# Patient Record
Sex: Male | Born: 1945 | Race: White | Hispanic: No | Marital: Married | Smoking: Former smoker
Health system: Southern US, Community
[De-identification: ages and names within clinical notes are randomized; demographics above are authoritative.]

## PROBLEM LIST (undated history)

## (undated) DIAGNOSIS — G4733 Obstructive sleep apnea (adult) (pediatric): Secondary | ICD-10-CM

## (undated) DIAGNOSIS — M25552 Pain in left hip: Secondary | ICD-10-CM

## (undated) DIAGNOSIS — Z87891 Personal history of nicotine dependence: Secondary | ICD-10-CM

## (undated) DIAGNOSIS — M545 Low back pain, unspecified: Secondary | ICD-10-CM

## (undated) DIAGNOSIS — M159 Polyosteoarthritis, unspecified: Secondary | ICD-10-CM

## (undated) DIAGNOSIS — N189 Chronic kidney disease, unspecified: Secondary | ICD-10-CM

## (undated) DIAGNOSIS — E7800 Pure hypercholesterolemia, unspecified: Secondary | ICD-10-CM

## (undated) DIAGNOSIS — Z96652 Presence of left artificial knee joint: Secondary | ICD-10-CM

## (undated) DIAGNOSIS — J189 Pneumonia, unspecified organism: Secondary | ICD-10-CM

## (undated) DIAGNOSIS — F329 Major depressive disorder, single episode, unspecified: Secondary | ICD-10-CM

## (undated) DIAGNOSIS — E1165 Type 2 diabetes mellitus with hyperglycemia: Secondary | ICD-10-CM

## (undated) DIAGNOSIS — K219 Gastro-esophageal reflux disease without esophagitis: Secondary | ICD-10-CM

## (undated) DIAGNOSIS — Z8673 Personal history of transient ischemic attack (TIA), and cerebral infarction without residual deficits: Secondary | ICD-10-CM

## (undated) DIAGNOSIS — T8454XA Infection and inflammatory reaction due to internal left knee prosthesis, initial encounter: Secondary | ICD-10-CM

## (undated) DIAGNOSIS — F32A Depression, unspecified: Secondary | ICD-10-CM

## (undated) DIAGNOSIS — E0840 Diabetes mellitus due to underlying condition with diabetic neuropathy, unspecified: Secondary | ICD-10-CM

## (undated) DIAGNOSIS — A389 Scarlet fever, uncomplicated: Secondary | ICD-10-CM

## (undated) HISTORY — DX: Gastro-esophageal reflux disease without esophagitis: K21.9

## (undated) HISTORY — DX: Polyosteoarthritis, unspecified: M15.9

## (undated) HISTORY — DX: Infection and inflammatory reaction due to internal left knee prosthesis, initial encounter: T84.54XA

## (undated) HISTORY — DX: Pure hypercholesterolemia, unspecified: E78.00

## (undated) HISTORY — DX: Depression, unspecified: F32.A

## (undated) HISTORY — DX: Personal history of nicotine dependence: Z87.891

## (undated) HISTORY — DX: Major depressive disorder, single episode, unspecified: F32.9

## (undated) HISTORY — DX: Personal history of transient ischemic attack (TIA), and cerebral infarction without residual deficits: Z86.73

## (undated) HISTORY — DX: Pain in left hip: M25.552

## (undated) HISTORY — DX: Presence of left artificial knee joint: Z96.652

## (undated) HISTORY — DX: Low back pain: M54.5

## (undated) HISTORY — PX: TOE AMPUTATION: SHX809

## (undated) HISTORY — DX: Obstructive sleep apnea (adult) (pediatric): G47.33

## (undated) HISTORY — DX: Low back pain, unspecified: M54.50

## (undated) HISTORY — DX: Diabetes mellitus due to underlying condition with diabetic neuropathy, unspecified: E08.40

## (undated) HISTORY — PX: JOINT REPLACEMENT: SHX530

## (undated) HISTORY — DX: Type 2 diabetes mellitus with hyperglycemia: E11.65

## (undated) HISTORY — PX: OTHER SURGICAL HISTORY: SHX169

## (undated) HISTORY — PX: EYE SURGERY: SHX253

---

## 2003-01-12 ENCOUNTER — Ambulatory Visit (HOSPITAL_COMMUNITY): Admission: RE | Admit: 2003-01-12 | Discharge: 2003-01-12 | Payer: Self-pay | Admitting: Unknown Physician Specialty

## 2011-01-19 ENCOUNTER — Other Ambulatory Visit: Payer: Self-pay | Admitting: Orthopaedic Surgery

## 2011-01-19 DIAGNOSIS — M5136 Other intervertebral disc degeneration, lumbar region: Secondary | ICD-10-CM

## 2011-01-28 ENCOUNTER — Other Ambulatory Visit: Payer: Self-pay | Admitting: Orthopaedic Surgery

## 2011-01-28 DIAGNOSIS — M545 Low back pain: Secondary | ICD-10-CM

## 2011-01-29 ENCOUNTER — Ambulatory Visit
Admission: RE | Admit: 2011-01-29 | Discharge: 2011-01-29 | Disposition: A | Discharge status: Home / Self Care | Payer: Medicare Other | Source: Ambulatory Visit | Attending: Orthopaedic Surgery | Admitting: Orthopaedic Surgery

## 2011-01-29 DIAGNOSIS — M545 Low back pain, unspecified: Secondary | ICD-10-CM

## 2016-01-07 ENCOUNTER — Ambulatory Visit (INDEPENDENT_AMBULATORY_CARE_PROVIDER_SITE_OTHER): Payer: Medicare Other | Admitting: Internal Medicine

## 2016-01-07 ENCOUNTER — Encounter: Payer: Self-pay | Admitting: Internal Medicine

## 2016-01-07 VITALS — BP 149/78 | HR 83 | Temp 97.6°F

## 2016-01-07 DIAGNOSIS — T8454XA Infection and inflammatory reaction due to internal left knee prosthesis, initial encounter: Secondary | ICD-10-CM | POA: Diagnosis not present

## 2016-01-07 DIAGNOSIS — I10 Essential (primary) hypertension: Secondary | ICD-10-CM | POA: Insufficient documentation

## 2016-01-07 DIAGNOSIS — T8450XA Infection and inflammatory reaction due to unspecified internal joint prosthesis, initial encounter: Secondary | ICD-10-CM

## 2016-01-07 DIAGNOSIS — Z96652 Presence of left artificial knee joint: Secondary | ICD-10-CM | POA: Diagnosis not present

## 2016-01-07 DIAGNOSIS — E1140 Type 2 diabetes mellitus with diabetic neuropathy, unspecified: Secondary | ICD-10-CM | POA: Insufficient documentation

## 2016-01-07 DIAGNOSIS — Z8673 Personal history of transient ischemic attack (TIA), and cerebral infarction without residual deficits: Secondary | ICD-10-CM | POA: Insufficient documentation

## 2016-01-07 DIAGNOSIS — M2550 Pain in unspecified joint: Secondary | ICD-10-CM | POA: Diagnosis not present

## 2016-01-07 DIAGNOSIS — E119 Type 2 diabetes mellitus without complications: Secondary | ICD-10-CM | POA: Insufficient documentation

## 2016-01-07 DIAGNOSIS — A4901 Methicillin susceptible Staphylococcus aureus infection, unspecified site: Secondary | ICD-10-CM | POA: Diagnosis not present

## 2016-01-07 DIAGNOSIS — E1165 Type 2 diabetes mellitus with hyperglycemia: Secondary | ICD-10-CM

## 2016-01-07 DIAGNOSIS — E0841 Diabetes mellitus due to underlying condition with diabetic mononeuropathy: Secondary | ICD-10-CM

## 2016-01-07 DIAGNOSIS — Z87891 Personal history of nicotine dependence: Secondary | ICD-10-CM | POA: Diagnosis not present

## 2016-01-07 LAB — CBC WITH DIFFERENTIAL/PLATELET
BASOS ABS: 70 {cells}/uL (ref 0–200)
Basophils Relative: 1 %
Eosinophils Absolute: 420 cells/uL (ref 15–500)
Eosinophils Relative: 6 %
HEMATOCRIT: 36.4 % — AB (ref 38.5–50.0)
HEMOGLOBIN: 11.8 g/dL — AB (ref 13.2–17.1)
LYMPHS ABS: 2240 {cells}/uL (ref 850–3900)
LYMPHS PCT: 32 %
MCH: 29.7 pg (ref 27.0–33.0)
MCHC: 32.4 g/dL (ref 32.0–36.0)
MCV: 91.7 fL (ref 80.0–100.0)
MONO ABS: 560 {cells}/uL (ref 200–950)
MPV: 9.1 fL (ref 7.5–12.5)
Monocytes Relative: 8 %
NEUTROS PCT: 53 %
Neutro Abs: 3710 cells/uL (ref 1500–7800)
Platelets: 302 10*3/uL (ref 140–400)
RBC: 3.97 MIL/uL — AB (ref 4.20–5.80)
RDW: 13.7 % (ref 11.0–15.0)
WBC: 7 10*3/uL (ref 3.8–10.8)

## 2016-01-07 LAB — COMPLETE METABOLIC PANEL WITH GFR
ALBUMIN: 3.2 g/dL — AB (ref 3.6–5.1)
ALK PHOS: 162 U/L — AB (ref 40–115)
ALT: 9 U/L (ref 9–46)
AST: 64 U/L — ABNORMAL HIGH (ref 10–35)
BUN: 15 mg/dL (ref 7–25)
CALCIUM: 9.1 mg/dL (ref 8.6–10.3)
CHLORIDE: 99 mmol/L (ref 98–110)
CO2: 33 mmol/L — ABNORMAL HIGH (ref 20–31)
Creat: 1.27 mg/dL — ABNORMAL HIGH (ref 0.70–1.18)
GFR, EST AFRICAN AMERICAN: 66 mL/min (ref >=60)
GFR, EST NON AFRICAN AMERICAN: 57 mL/min — AB (ref >=60)
Glucose, Bld: 91 mg/dL (ref 65–99)
POTASSIUM: 4.1 mmol/L (ref 3.5–5.3)
Sodium: 142 mmol/L (ref 135–146)
Total Bilirubin: 0.4 mg/dL (ref 0.2–1.2)
Total Protein: 6.9 g/dL (ref 6.1–8.1)

## 2016-01-07 LAB — C-REACTIVE PROTEIN: CRP: 3.9 mg/dL — AB (ref <0.60)

## 2016-01-07 MED ORDER — KETOROLAC TROMETHAMINE 30 MG/ML IJ SOLN
30.0000 mg | Freq: Once | INTRAMUSCULAR | Status: AC
  Administered 2016-01-07: 30 mg via INTRAMUSCULAR

## 2016-01-07 NOTE — Progress Notes (Signed)
RFV: new consultation for management of MSSA PJI Subjective:    Patient ID: Marisa SprinklesMichael R Diehl, male    DOB: 08/24/1946, 70 y.o.   MRN: [id number]  HPI  70 yo M with difficult to control diabetes mellitus, long-standing smoker, osteoarthritis of left knee, underwent TKA in 2014 by Dr. Christell ConstantMoore in [place], [place]. His original surgery went well per patient report. In early-mid March, the patient had new onset left knee pain, myalgias, low grade fever < 7 days prior to evaluation on 3/11 where he presented to [facility] for evaluation, he was seen by orthopedist on-call, Dr Nicki GuadalajaraJeff Yaste who did arthrocentesis which the family reported as thick milk-chocolate, foul smelling appearance. Synovial fluid analysis showed 278K with 85%N  with gram stain showing GPCC concerning for prosthetic joint infection. He had underwent I x D, with size 14 zimmer gext gen 5,6 polyethylene exchange on 3/13. OR report commented that tibial base plate and femoral components were well fixed. OR cultures grew MSSA (s oxa, S tetra, S sulfa, S clinda) There is mention of positive blood cx for MSSA however i have not been able to locate culture results if that is true. He was discharged on 6 wk of cefazolin 2gm IV Q 8hr then followed up with Dr. Redmond PullingYatse on 3/22 where he noticed that he had not yet started on rifampin. Patient then started on rifampin 450mg  BID. The patient is tolerating antibiotics but he has noticed having increasing pain to his left knee, feeling especially tight. He also noticed inferior aspect of knee becoming increasingly red. He is participating in PT. Still needs assistance to get to bathroom  No fever, chills, nightsweats. He has not seen his original surgeon, Dr Christell ConstantMoore in [place] as of yet.  He has quit smoking since his hospital admission. He reports fluctuation in his BS, nadir of 60 last night after receiving u500 insulin dosing. He previously was on BID dosing now on QHS to minimize risk of hypoglycemia  No  Known Allergies  - i have reviewed his medications given by SNF - i have reviewed hospital records, clinic records, and micro results Active Ambulatory Problems    Diagnosis Date Noted  . Staph aureus infection 01/07/2016  . Infection of prosthetic left knee joint (HCC) 01/07/2016  . History of arthroplasty of left knee 01/07/2016  . Former smoker 01/07/2016  . Poorly controlled type 2 diabetes mellitus (HCC) 01/07/2016  . History of CVA (cerebrovascular accident) 01/07/2016  . Diabetic neuropathy (HCC) 01/07/2016  . Essential hypertension 01/07/2016   Resolved Ambulatory Problems    Diagnosis Date Noted  . No Resolved Ambulatory Problems   Past Medical History  Diagnosis Date  . Poorly controlled diabetes mellitus (HCC)   . 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 History of stroke   . Generalized OA   . Lumbago   . Left hip pain   . History of left knee replacement   . OSA (obstructive sleep apnea)   . Depression   . Hypercholesteremia   - [facility]. Endocrinologist gives him u500 insulin Social History  Substance Use Topics  . Smoking status: Former Smoker -- 1.00 packs/day for 15 years    Types: Cigarettes    Quit date: 12/15/2015  . Smokeless tobacco: Not on file  . Alcohol Use: 0.6 oz/week    1 Standard drinks or equivalent per week  - retired Visual merchandiserfarmer,  [employer]  Family hx: significant for hypertension, heart  disease, and diabetes  Review of Systems Constitutional: Negative for fever, chills, diaphoresis, activity change, appetite change, fatigue and unexpected weight change.  HENT: Negative for congestion, sore throat, rhinorrhea, sneezing, trouble swallowing and sinus pressure.  Eyes: Negative for photophobia and visual disturbance.  Respiratory: Negative for cough, chest tightness, shortness of breath, wheezing and stridor.  Cardiovascular: Negative for chest pain, palpitations and leg swelling.   Gastrointestinal: Negative for nausea, vomiting, abdominal pain, diarrhea, constipation, blood in stool, abdominal distention and anal bleeding.  Genitourinary: Negative for dysuria, hematuria, flank pain and difficulty urinating.  Musculoskeletal: + left knee pain, tightness, swelling, and redness Skin: Negative for color change, pallor, rash and wound.  Neurological: Negative for dizziness, tremors, weakness and light-headedness.  Hematological: Negative for adenopathy. Does not bruise/bleed easily.  Psychiatric/Behavioral: Negative for behavioral problems, confusion, sleep disturbance, dysphoric mood, decreased concentration and agitation.       Objective:   Physical Exam BP 149/78 mmHg  Pulse 83  Temp(Src) 97.6 F (36.4 C) (Oral) Physical Exam  Constitutional: He is oriented to person, place, and time. He appears well-developed and well-nourished. No distress. Obese male in wheelchair HENT:  Mouth/Throat: Oropharynx is clear and moist. No oropharyngeal exudate.  Cardiovascular: Normal rate, regular rhythm and normal heart sounds. Exam reveals no gallop and no friction rub.  No murmur heard.  Pulmonary/Chest: Effort normal and breath sounds normal. No respiratory distress. He has no wheezes.  Abdominal: Soft. Bowel sounds are normal. He exhibits no distension. There is no tenderness.  Lymphadenopathy:  He has no cervical adenopathy.  Ext: right arm picc line is c/d/i Neurological: He is alert and oriented to person, place, and time.  Skin: left knee shows large incision that is healed though the inferior aspect of incision has surrounding erythema that does not involve the entire patellar. No fluctuance or drainage that could be appreciated. + effusion Ext+ +1 edema to legs bilaterally Psychiatric: He has a normal mood and affect. His behavior is normal.    Micro: 3/13 MSSA for synovial fluid nad tissue culture      Assessment & Plan:  MSSA prosthetic joint infection = the  patient is on 3 of 6 wks of IV cefazolin though it appears that his knee may have worsened in respect to pain, erythema, and localized swelling of late. We will check cbc with diff, bmp, sed rate and crp. I have spoke to dr. Deberah Castle to coordinate repeat arthocentesis on 4/6 at 8am in order to determine if the patient has ongoing infection  i have also spoken to Dr. Kathi Der PA in [place] who will reach out to the patient to get appt on April 11th.   My concern is that his knee should look better than it does. I am unsure if this reaccumulation of fluid is on going infection vs. Inflammation. I would have a low threshold to wash out again vs. Having patient undergo antibiotic spacer placement for a 2 staged revision.  For now, will continue cefazolin 2gm Iv q 8hr (til April 25th which would be 6 wk) then we will convert to cefalexin and continue on rifampin at  bid dose for a total abtx duration of 6 months.  Pain control = gave a dose of toradol  IM today  Diabetes = concern that recent illness has also impacted his BS management. We have called Dr Ardelle Park office to encourage close observation or reassessment  i have spent 90 min with patient with greater than 50% on coordination of care and  review notes/micro results/counseling  rtc in 3 wk

## 2016-01-07 NOTE — Progress Notes (Deleted)
Patient ID: Marc Vasquez, male   DOB: 01/04/1946, 70 y.o.   MRN: [id number]

## 2016-01-08 LAB — SEDIMENTATION RATE: SED RATE: 122 mm/h — AB (ref 0–20)

## 2016-01-20 ENCOUNTER — Telehealth: Payer: Self-pay | Admitting: *Deleted

## 2016-01-20 NOTE — Telephone Encounter (Signed)
Call from patient's skilled nursing facility for antibiotic stop date. Per Dr. Feliz BeamSnider's last office note on 01/07/16 the IV antibiotic should continue through 01/27/16 and advised the RN to call back at that time for further instructions. Dr. Drue SecondSnider is going to transition patient to oral antibiotics. Dr. Drue SecondSnider, please advise if patient can have the pic line pulled on 01/27/16. Wendall MolaJacqueline Cockerham

## 2016-02-05 ENCOUNTER — Ambulatory Visit (INDEPENDENT_AMBULATORY_CARE_PROVIDER_SITE_OTHER): Payer: Medicare Other | Admitting: Internal Medicine

## 2016-02-05 ENCOUNTER — Encounter: Payer: Self-pay | Admitting: Internal Medicine

## 2016-02-05 VITALS — BP 160/75 | HR 75 | Temp 98.1°F | Ht 72.0 in | Wt 310.0 lb

## 2016-02-05 DIAGNOSIS — R197 Diarrhea, unspecified: Secondary | ICD-10-CM

## 2016-02-05 DIAGNOSIS — T8450XD Infection and inflammatory reaction due to unspecified internal joint prosthesis, subsequent encounter: Secondary | ICD-10-CM

## 2016-02-05 LAB — CBC WITH DIFFERENTIAL/PLATELET
BASOS ABS: 64 {cells}/uL (ref 0–200)
Basophils Relative: 1 %
Eosinophils Absolute: 256 cells/uL (ref 15–500)
Eosinophils Relative: 4 %
HEMATOCRIT: 36.4 % — AB (ref 38.5–50.0)
Hemoglobin: 11.7 g/dL — ABNORMAL LOW (ref 13.2–17.1)
LYMPHS PCT: 27 %
Lymphs Abs: 1728 cells/uL (ref 850–3900)
MCH: 29.5 pg (ref 27.0–33.0)
MCHC: 32.1 g/dL (ref 32.0–36.0)
MCV: 91.7 fL (ref 80.0–100.0)
MONO ABS: 384 {cells}/uL (ref 200–950)
MPV: 9.4 fL (ref 7.5–12.5)
Monocytes Relative: 6 %
Neutro Abs: 3968 cells/uL (ref 1500–7800)
Neutrophils Relative %: 62 %
Platelets: 239 10*3/uL (ref 140–400)
RBC: 3.97 MIL/uL — ABNORMAL LOW (ref 4.20–5.80)
RDW: 13.3 % (ref 11.0–15.0)
WBC: 6.4 10*3/uL (ref 3.8–10.8)

## 2016-02-05 LAB — BASIC METABOLIC PANEL
BUN: 21 mg/dL (ref 7–25)
CHLORIDE: 100 mmol/L (ref 98–110)
CO2: 27 mmol/L (ref 20–31)
Calcium: 8.9 mg/dL (ref 8.6–10.3)
Creat: 1.07 mg/dL (ref 0.70–1.18)
GLUCOSE: 367 mg/dL — AB (ref 65–99)
POTASSIUM: 4.4 mmol/L (ref 3.5–5.3)
Sodium: 136 mmol/L (ref 135–146)

## 2016-02-05 LAB — C-REACTIVE PROTEIN: CRP: 1.1 mg/dL — ABNORMAL HIGH (ref <0.60)

## 2016-02-05 MED ORDER — DIPHENOXYLATE-ATROPINE 2.5-0.025 MG/5ML PO LIQD: 5.0000 mL | Freq: Four times a day (QID) | ORAL | Status: DC | PRN

## 2016-02-05 MED ORDER — CEPHALEXIN 500 MG PO CAPS: 500.0000 mg | ORAL_CAPSULE | Freq: Three times a day (TID) | ORAL | Status: DC

## 2016-02-05 MED ORDER — RIFAMPIN 300 MG PO CAPS: 300.0000 mg | ORAL_CAPSULE | Freq: Two times a day (BID) | ORAL | Status: DC

## 2016-02-05 MED ORDER — SACCHAROMYCES BOULARDII 250 MG PO CAPS: 250.0000 mg | ORAL_CAPSULE | Freq: Two times a day (BID) | ORAL | Status: DC

## 2016-02-05 NOTE — Progress Notes (Signed)
RFV: follow up on MSSA prosthetic joint infection Subjective:    Patient ID: Marc Vasquez, male    DOB: 1946-08-28, 70 y.o.   MRN: [id number]  HPI  70 yo M with difficult to control diabetes mellitus, long-standing smoker, osteoarthritis of left knee, underwent TKA in 2014 by Dr. Christell Constant in [place], [place]. His original surgery went well per patient report. In early-mid March, the patient had new onset left knee pain, myalgias, low grade fever < 7 days prior to evaluation on 3/11 where he presented to [facility] for evaluation, he was seen by orthopedist on-call, Dr Nicki Guadalajara who did arthrocentesis which the family reported as thick milk-chocolate, foul smelling appearance. Synovial fluid analysis showed 278K with 85%N with gram stain showing GPCC concerning for prosthetic joint infection. He had underwent I x D, with size 14 zimmer gext gen 5,6 polyethylene exchange on 3/13. OR report commented that tibial base plate and femoral components were well fixed. OR cultures grew MSSA (s oxa, S tetra, S sulfa, S clinda) There is mention of positive blood cx for MSSA however i have not been able to locate culture results if that is true. He was discharged on 6 wk of cefazolin 2gm IV Q 8hr then followed up with Dr. Redmond Pulling on 3/22 where he noticed that he had not yet started on rifampin. Patient then started on rifampin  BID. The patient is tolerating antibiotics but he has noticed having increasing pain to his left knee, feeling especially tight. He also noticed inferior aspect of knee becoming increasingly red. He is participating in PT. We initially saw him on April 5th with the plan to have him see both Dr. Redmond Pulling and Dr. Christell Constant in [place] to see what they thought. Both felt to continue with medical management and did not see the reason for operating. He has been on cefazolin plus rifampin since then. He has had abtx associated diarrhea, immodium not helping. He has had 3-4 bm per day. He has been tested  for cdiff numerous times. He is about to be done with snf and scheduled for discharge on 5/6. He is here with wife, and daughter, who is a Teacher, early years/pre to see what next plan of treatment. The patient states that his knee is much better, less warnth, and less swelling  At last visit, he mentioned that SNF had difficulty with u500 administration. They are currenlty covering him with sliding scale. He has upcoming appt with endocrinologist next week.  No Known Allergies Current Outpatient Prescriptions on File Prior to Visit  Medication Sig Dispense Refill  . acetaminophen (TYLENOL) 325 MG tablet Take 650 mg by mouth every 6 (six) hours as needed (for breakthrough pain).    Marland Kitchen aspirin EC 81 MG tablet Take 81 mg by mouth daily.    Marland Kitchen atorvastatin (LIPITOR) 80 MG tablet Take 80 mg by mouth daily.    . carboxymethylcellulose (REFRESH PLUS) 0.5 % SOLN Place 1 drop into both eyes 4 (four) times daily.    Marland Kitchen ceFAZolin in dextrose 5 % 50 mL ivpb Inject into the vein once.    . Cholecalciferol (VITAMIN D3) 2000 units TABS Take by mouth daily.    . Ferrous Fumarate (HEMOCYTE - 106 MG FE) 324 (106 Fe) MG TABS tablet Take 1 tablet by mouth daily.    . furosemide (LASIX) 40 MG tablet Take 80 mg by mouth.    Marland Kitchen HYDROcodone-acetaminophen (NORCO) 10-325 MG tablet Take 1 tablet by mouth every 4 (four) hours as needed for  moderate pain.    Marland Kitchen. insulin NPH-regular Human (NOVOLIN 70/30) (70-30) 100 UNIT/ML injection Inject 100 Units into the skin every morning.    . Lactobacillus Rhamnosus, GG, (CULTURELLE) CAPS Take by mouth 2 (two) times daily.    Marland Kitchen. loperamide (IMODIUM A-D) 2 MG tablet Take 2 mg by mouth as needed for diarrhea or loose stools (every 4 hours).    . magnesium oxide (MAG-OX) 400 MG tablet Take 400 mg by mouth 2 (two) times daily.    . Multiple Vitamin (MULTIVITAMIN) capsule Take 1 capsule by mouth daily.    . Nutritional Supplements (BOOST GLUCOSE CONTROL) LIQD Take by mouth.    Marland Kitchen. omeprazole (PRILOSEC) 40 MG  capsule Take 40 mg by mouth daily.    . potassium chloride (KLOR-CON) 20 MEQ packet Take 40 mEq by mouth daily.    . tamsulosin (FLOMAX) 0.4 MG CAPS capsule Take 0.4 mg by mouth daily.     No current facility-administered medications on file prior to visit.   Active Ambulatory Problems    Diagnosis Date Noted  . Staph aureus infection 01/07/2016  . Infection of prosthetic left knee joint (HCC) 01/07/2016  . History of arthroplasty of left knee 01/07/2016  . Former smoker 01/07/2016  . Poorly controlled type 2 diabetes mellitus (HCC) 01/07/2016  . History of CVA (cerebrovascular accident) 01/07/2016  . Diabetic neuropathy (HCC) 01/07/2016  . Essential hypertension 01/07/2016   Resolved Ambulatory Problems    Diagnosis Date Noted  . No Resolved Ambulatory Problems   Past Medical History  Diagnosis Date  . Poorly controlled diabetes mellitus (HCC)   . 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 History of stroke   . Generalized OA   . Lumbago   . Left hip pain   . History of left knee replacement   . OSA (obstructive sleep apnea)   . Depression   . Hypercholesteremia     sochx: He has quit smoking since his hospital admission.looking forward to getting back home  duaghter Ulysees Barnsmichelle luther [phone]  Review of Systems Still haivng knee pain but improved from prior visit, occ loose stool. 10 point ros is othewise negative    Objective:   Physical Exam  BP 160/75 mmHg  Pulse 75  Temp(Src) 98.1 F (36.7 C) (Oral)  Ht 6' (1.829 m)  Wt 310 lb (140.615 kg)  BMI 42.03 kg/m2 Gen= a xo by 3 in nad Ext= Left knee little to no warmth. Well healed no erythema. No effusion + 1 edema le Venous stasis changes  Lab Results  Component Value Date   ESRSEDRATE 85* 02/05/2016   Lab Results  Component Value Date   CRP 1.1* 02/05/2016        Assessment & Plan:  mssa pji = will check sed rate and crp. If still markedly  elevated, will extend out to complete with 8 wk iv abtx. If normalizing, will switch to keflex 500mg  tid plus rif 300mg  bid.  Hx of mssa bacteremia = his hospitalization does not appear that he had TTE. Will refer to cardiology to optimize his management as well.  Diarrhea = will do trial of lomotil to see if helps symptoms. Will give probiotic.  Dm with peripheral neuroapthy = will see endocrine next week for help with managemen

## 2016-02-06 ENCOUNTER — Telehealth: Payer: Self-pay | Admitting: *Deleted

## 2016-02-06 LAB — SEDIMENTATION RATE: Sed Rate: 85 mm/hr — ABNORMAL HIGH (ref 0–20)

## 2016-02-06 NOTE — Telephone Encounter (Signed)
Verbal order given to Fleet Contrasachel at [facility] for IV Ceftriacone 2 gm x 2 weeks.  Please confirm if this needs to be once daily.   Their discharge coordinator is going to work on this before close of business today, will call if any issue. Andree CossHowell, Shamarcus Hoheisel M, RN

## 2016-02-06 NOTE — Telephone Encounter (Signed)
-----   Message from Judyann Munsonynthia Snider, MD sent at 02/06/2016  2:08 PM EDT ----- Can you call [facility] in [place] to see if they can arrange for home health for patient to get ceftriaxone 2gm IV x 2 wk starting tomorrow. He is currently on cefazolin, but is going to be discharged on on Saturday..so they need home health to give the abtx. Thanks for arranging this for me.

## 2016-02-11 NOTE — Telephone Encounter (Signed)
Not sure if this was clarified. Yes, ceftriaxone 2gm iv once a day

## 2016-02-12 NOTE — Telephone Encounter (Signed)
Patient has been discharged from SNF, is receiving IV antibiotics from [facility] is supplying medication).  IV ceftriaxone 2gm/day from 5/7 - 5/20.  If need lab orders, can call [facility] at [phone].  [phone])

## 2016-02-18 NOTE — Telephone Encounter (Addendum)
[facility] needing order to remove PICC, 02/22/15, last dose 5/20.  RN received telephone order from Dr. Ivar Drape. Snider for PICC removal after last does of ceftriaxone on 02/21/16.  [facility], Charlene, given verbal order for PICC removal after last dose of IV Rocephin on 02/21/16.  Order repeated back.

## 2016-02-18 NOTE — Telephone Encounter (Addendum)
Patient's wife called asking if Marc Vasquez will be on oral antibiotics after completion of rocephin.  From last note, antibiotics (keflex and rifampin) were [facility] to [facility] [place]. [facility] in [place].  RN contacted [facility] to set up the transfer. Patient will pick up today or tomorrow.

## 2016-02-26 ENCOUNTER — Other Ambulatory Visit: Payer: Self-pay | Admitting: Internal Medicine

## 2016-02-26 NOTE — Telephone Encounter (Signed)
Refills already on file at pharmacy

## 2016-03-09 ENCOUNTER — Ambulatory Visit: Payer: Medicare Other | Admitting: Internal Medicine

## 2016-03-11 ENCOUNTER — Telehealth: Payer: Self-pay | Admitting: *Deleted

## 2016-03-11 ENCOUNTER — Ambulatory Visit: Payer: Medicare Other | Admitting: Internal Medicine

## 2016-03-11 NOTE — Telephone Encounter (Signed)
Pt had the wrong date on his calendar.  Made new appt for Mon., June 12 at 3:30 PM.

## 2016-03-15 ENCOUNTER — Encounter: Payer: Self-pay | Admitting: Internal Medicine

## 2016-03-15 ENCOUNTER — Ambulatory Visit (INDEPENDENT_AMBULATORY_CARE_PROVIDER_SITE_OTHER): Payer: Medicare Other | Admitting: Internal Medicine

## 2016-03-15 VITALS — BP 156/71 | HR 69 | Temp 97.4°F | Wt 318.0 lb

## 2016-03-15 DIAGNOSIS — K529 Noninfective gastroenteritis and colitis, unspecified: Secondary | ICD-10-CM | POA: Diagnosis not present

## 2016-03-15 DIAGNOSIS — R634 Abnormal weight loss: Secondary | ICD-10-CM

## 2016-03-15 DIAGNOSIS — K521 Toxic gastroenteritis and colitis: Secondary | ICD-10-CM

## 2016-03-15 DIAGNOSIS — Z789 Other specified health status: Secondary | ICD-10-CM | POA: Diagnosis not present

## 2016-03-15 DIAGNOSIS — T8450XS Infection and inflammatory reaction due to unspecified internal joint prosthesis, sequela: Secondary | ICD-10-CM | POA: Diagnosis not present

## 2016-03-15 DIAGNOSIS — Z9189 Other specified personal risk factors, not elsewhere classified: Secondary | ICD-10-CM

## 2016-03-15 DIAGNOSIS — A4901 Methicillin susceptible Staphylococcus aureus infection, unspecified site: Secondary | ICD-10-CM | POA: Diagnosis present

## 2016-03-15 DIAGNOSIS — T3695XA Adverse effect of unspecified systemic antibiotic, initial encounter: Secondary | ICD-10-CM

## 2016-03-15 NOTE — Progress Notes (Signed)
RFV: MSSA prosthetic joint infection of left knee Subjective:    Patient ID: Marc SprinklesMichael R Manolis, male    DOB: 05/18/1946, 70 y.o.   MRN: [id number]  HPI  70yo M with HTN, IDDM currently on u500 insulin, hx of prosthetic joint who was admitted to [facility] with sepsis found to have MSSA bacteremia and PJI. He underwent wash out, poly-exchanged but slow to respond (sed rate 85, CRP 1.1 last month). He was seen by his original orthopedist, Dr. Christell ConstantMoore, who wanted to see how he did on prolonged IV abtx before further surgery. He was given 8wk of IV therapy through 5/16 then switched to cephalexin plus rifampin.  Patient states that he has watery diarrhea, with abdominal cramping. He takes immodium to help with symptoms.   He has lost 30-35# since being ill, dating back to March 2017  His left knee remains somewhat stiff, tolerable pain, at a 5 of 10 range.  His appetite is decreased which he thinks is due to abtx. He has good breakfast but decrease intake at lunch and dinner  No Known Allergies Current Outpatient Prescriptions on File Prior to Visit  Medication Sig Dispense Refill  . acetaminophen (TYLENOL) 325 MG tablet Take 650 mg by mouth every 6 (six) hours as needed (for breakthrough pain).    Marland Kitchen. aspirin EC 81 MG tablet Take 81 mg by mouth daily.    Marland Kitchen. atorvastatin (LIPITOR) 80 MG tablet Take 80 mg by mouth daily.    . carboxymethylcellulose (REFRESH PLUS) 0.5 % SOLN Place 1 drop into both eyes 4 (four) times daily.    Marland Kitchen. ceFAZolin in dextrose 5 % 50 mL ivpb Inject into the vein once.    . cephALEXin (KEFLEX) 500 MG capsule Take 1 capsule (500 mg total) by mouth 3 (three) times daily. 90 capsule 3  . Cholecalciferol (VITAMIN D3) 2000 units TABS Take by mouth daily.    . diphenoxylate-atropine (LOMOTIL) 2.5-0.025 MG/5ML liquid Take 5 mLs by mouth 4 (four) times daily as needed for diarrhea or loose stools. 60 mL 0  . Ferrous Fumarate (HEMOCYTE - 106 MG FE) 324 (106 Fe) MG TABS tablet Take 1 tablet  by mouth daily.    . furosemide (LASIX) 40 MG tablet Take 80 mg by mouth.    Marland Kitchen. HYDROcodone-acetaminophen (NORCO) 10-325 MG tablet Take 1 tablet by mouth every 4 (four) hours as needed for moderate pain.    Marland Kitchen. insulin NPH-regular Human (NOVOLIN 70/30) (70-30) 100 UNIT/ML injection Inject 100 Units into the skin every morning.    . Lactobacillus Rhamnosus, GG, (CULTURELLE) CAPS Take by mouth 2 (two) times daily.    Marland Kitchen. loperamide (IMODIUM A-D) 2 MG tablet Take 2 mg by mouth as needed for diarrhea or loose stools (every 4 hours).    . magnesium oxide (MAG-OX) 400 MG tablet Take 400 mg by mouth 2 (two) times daily.    . Multiple Vitamin (MULTIVITAMIN) capsule Take 1 capsule by mouth daily.    . Nutritional Supplements (BOOST GLUCOSE CONTROL) LIQD Take by mouth.    Marland Kitchen. omeprazole (PRILOSEC) 40 MG capsule Take 40 mg by mouth daily.    . potassium chloride (KLOR-CON) 20 MEQ packet Take 40 mEq by mouth daily.    . rifampin (RIFADIN) 300 MG capsule Take 1 capsule (300 mg total) by mouth 2 (two) times daily. 60 capsule 3  . saccharomyces boulardii (FLORASTOR) 250 MG capsule Take 1 capsule (250 mg total) by mouth 2 (two) times daily. 60 capsule 3  . tamsulosin (  FLOMAX) 0.4 MG CAPS capsule Take 0.4 mg by mouth daily.     No current facility-administered medications on file prior to visit.   This SmartLink has not been configured with any valid records.     Review of Systems + right knee pain, stiffness, loss of appetite, diarrhea. 10 point ros is othewise negative    Objective:   Physical Exam BP 156/71 mmHg  Pulse 69  Temp(Src) 97.4 F (36.3 C) (Oral)  Wt 318 lb (144.244 kg) gen = a xo by 3 in nad Heent= no signs of thrush Pulm= ctab no w/c/r Cors= nl s1,s2, no g/m/r Abd= protuberant, soft, +BS Ext = 1+ edema, chronic venous stasis changes. Left knee is slightly warm distal patella but otherwise well healed incision. No effusion no erythema  Labs: Lab Results  Component Value Date    ESRSEDRATE 85* 02/05/2016   Lab Results  Component Value Date   CRP 1.1* 02/05/2016        Assessment & Plan:  MSSA prosthetic joint infection- will check cbc with diff, cmp, sed rate and crp. Continue keflex  tid pluf rif  bid x 3 months to finish out 6 wk course. Will see if his inflammatory markers have improved  Diarrhea = at last visit, it was thought to be abtx associated diarrhea, and he was given a course of florastor, which improved slightly. Still at risk for getting c.difficile  - will get cdiff with pcr, give oral vanco to use if positive test  - weight loss = will check cmp. Likely due to loss of appetite from antibiotics  --------------addendum---------------- - need to repeat lab work at [facility] since they forgot to check labs at clinic

## 2016-03-17 ENCOUNTER — Other Ambulatory Visit: Payer: Self-pay | Admitting: Internal Medicine

## 2016-03-17 ENCOUNTER — Telehealth: Payer: Self-pay | Admitting: *Deleted

## 2016-03-17 NOTE — Telephone Encounter (Addendum)
Patient informed that an Rx was sent to his address on yesterday 03/16/16 and yes he can take them into the [facility] location. The patient was not happy with his local [facility] location and does not want to go back there.

## 2016-03-17 NOTE — Telephone Encounter (Signed)
Patient forgot to have labs drawn at [facility] this week.  Cbc with diff, cmp, sed rate and crp were ordered.  Patient/wife would like to have labs drawn at [facility], ph# [phone].  Dr. Drue SecondSnider please advise.  RN will give Dr. Drue SecondSnider this information today.

## 2016-03-19 LAB — CLOSTRIDIUM DIFFICILE EIA: C difficile Toxins A+B, EIA: NEGATIVE

## 2016-03-30 ENCOUNTER — Telehealth: Payer: Self-pay | Admitting: *Deleted

## 2016-03-30 NOTE — Telephone Encounter (Signed)
Patient is asking for interpretation of his lab results recently drawn/faxed from his primary doctor's office, blood work and stool. They are looking for answers, as the patient is experiencing other issues now including kidney stones. Unsure if labs have been received, will contact MD. Andree CossHowell, Bronsyn Shappell M, RN

## 2016-04-01 NOTE — Telephone Encounter (Signed)
Spoke with Dr. Drue SecondSnider. Patient's labs are looking better.  He is C. Diff negative. If having diarrhea, can use immodium.  Still avoid antibiotics. Stay hydrated. Relayed her advice to patient.  He verbalized understanding, agreement. Patient follows up with urology and primary care next week.

## 2016-04-27 ENCOUNTER — Ambulatory Visit: Payer: Medicare Other | Admitting: Internal Medicine

## 2016-05-28 ENCOUNTER — Inpatient Hospital Stay (HOSPITAL_COMMUNITY): Payer: Medicare Other

## 2016-05-28 ENCOUNTER — Emergency Department (HOSPITAL_COMMUNITY): Payer: Medicare Other

## 2016-05-28 ENCOUNTER — Inpatient Hospital Stay (HOSPITAL_COMMUNITY): Payer: Medicare Other | Admitting: Certified Registered"

## 2016-05-28 ENCOUNTER — Encounter (HOSPITAL_COMMUNITY)
Admission: EM | Disposition: A | Discharge status: Skilled Nursing Facility | Payer: Self-pay | Source: Home / Self Care | Attending: Internal Medicine

## 2016-05-28 ENCOUNTER — Inpatient Hospital Stay (HOSPITAL_COMMUNITY)
Admission: EM | Admit: 2016-05-28 | Discharge: 2016-06-01 | DRG: 464 | Disposition: A | Discharge status: Skilled Nursing Facility | Payer: Medicare Other | Attending: Internal Medicine | Admitting: Internal Medicine

## 2016-05-28 ENCOUNTER — Encounter (HOSPITAL_COMMUNITY): Payer: Self-pay | Admitting: Emergency Medicine

## 2016-05-28 DIAGNOSIS — Z6841 Body Mass Index (BMI) 40.0 and over, adult: Secondary | ICD-10-CM | POA: Diagnosis not present

## 2016-05-28 DIAGNOSIS — T8454XA Infection and inflammatory reaction due to internal left knee prosthesis, initial encounter: Principal | ICD-10-CM

## 2016-05-28 DIAGNOSIS — D62 Acute posthemorrhagic anemia: Secondary | ICD-10-CM | POA: Diagnosis not present

## 2016-05-28 DIAGNOSIS — Y831 Surgical operation with implant of artificial internal device as the cause of abnormal reaction of the patient, or of later complication, without mention of misadventure at the time of the procedure: Secondary | ICD-10-CM | POA: Diagnosis present

## 2016-05-28 DIAGNOSIS — E861 Hypovolemia: Secondary | ICD-10-CM | POA: Diagnosis not present

## 2016-05-28 DIAGNOSIS — I10 Essential (primary) hypertension: Secondary | ICD-10-CM | POA: Diagnosis present

## 2016-05-28 DIAGNOSIS — E7800 Pure hypercholesterolemia, unspecified: Secondary | ICD-10-CM | POA: Diagnosis present

## 2016-05-28 DIAGNOSIS — T361X5A Adverse effect of cephalosporins and other beta-lactam antibiotics, initial encounter: Secondary | ICD-10-CM | POA: Diagnosis not present

## 2016-05-28 DIAGNOSIS — K521 Toxic gastroenteritis and colitis: Secondary | ICD-10-CM | POA: Diagnosis not present

## 2016-05-28 DIAGNOSIS — E1165 Type 2 diabetes mellitus with hyperglycemia: Secondary | ICD-10-CM | POA: Diagnosis present

## 2016-05-28 DIAGNOSIS — E1151 Type 2 diabetes mellitus with diabetic peripheral angiopathy without gangrene: Secondary | ICD-10-CM | POA: Diagnosis present

## 2016-05-28 DIAGNOSIS — Z794 Long term (current) use of insulin: Secondary | ICD-10-CM | POA: Diagnosis not present

## 2016-05-28 DIAGNOSIS — Z7902 Long term (current) use of antithrombotics/antiplatelets: Secondary | ICD-10-CM | POA: Diagnosis not present

## 2016-05-28 DIAGNOSIS — Z885 Allergy status to narcotic agent status: Secondary | ICD-10-CM

## 2016-05-28 DIAGNOSIS — Z87891 Personal history of nicotine dependence: Secondary | ICD-10-CM

## 2016-05-28 DIAGNOSIS — K219 Gastro-esophageal reflux disease without esophagitis: Secondary | ICD-10-CM | POA: Diagnosis present

## 2016-05-28 DIAGNOSIS — Z96652 Presence of left artificial knee joint: Secondary | ICD-10-CM | POA: Diagnosis not present

## 2016-05-28 DIAGNOSIS — G4733 Obstructive sleep apnea (adult) (pediatric): Secondary | ICD-10-CM | POA: Diagnosis present

## 2016-05-28 DIAGNOSIS — I340 Nonrheumatic mitral (valve) insufficiency: Secondary | ICD-10-CM | POA: Diagnosis not present

## 2016-05-28 DIAGNOSIS — B9561 Methicillin susceptible Staphylococcus aureus infection as the cause of diseases classified elsewhere: Secondary | ICD-10-CM | POA: Diagnosis present

## 2016-05-28 DIAGNOSIS — A4901 Methicillin susceptible Staphylococcus aureus infection, unspecified site: Secondary | ICD-10-CM | POA: Diagnosis present

## 2016-05-28 DIAGNOSIS — N179 Acute kidney failure, unspecified: Secondary | ICD-10-CM | POA: Diagnosis not present

## 2016-05-28 DIAGNOSIS — M25462 Effusion, left knee: Secondary | ICD-10-CM | POA: Diagnosis present

## 2016-05-28 DIAGNOSIS — E1140 Type 2 diabetes mellitus with diabetic neuropathy, unspecified: Secondary | ICD-10-CM | POA: Diagnosis present

## 2016-05-28 DIAGNOSIS — B9689 Other specified bacterial agents as the cause of diseases classified elsewhere: Secondary | ICD-10-CM | POA: Diagnosis not present

## 2016-05-28 DIAGNOSIS — I69398 Other sequelae of cerebral infarction: Secondary | ICD-10-CM

## 2016-05-28 DIAGNOSIS — B957 Other staphylococcus as the cause of diseases classified elsewhere: Secondary | ICD-10-CM | POA: Diagnosis not present

## 2016-05-28 DIAGNOSIS — M009 Pyogenic arthritis, unspecified: Secondary | ICD-10-CM | POA: Diagnosis present

## 2016-05-28 DIAGNOSIS — Y792 Prosthetic and other implants, materials and accessory orthopedic devices associated with adverse incidents: Secondary | ICD-10-CM | POA: Diagnosis not present

## 2016-05-28 DIAGNOSIS — M00862 Arthritis due to other bacteria, left knee: Secondary | ICD-10-CM | POA: Diagnosis not present

## 2016-05-28 DIAGNOSIS — Z89529 Acquired absence of unspecified knee: Secondary | ICD-10-CM

## 2016-05-28 DIAGNOSIS — M00062 Staphylococcal arthritis, left knee: Secondary | ICD-10-CM | POA: Diagnosis not present

## 2016-05-28 DIAGNOSIS — Z833 Family history of diabetes mellitus: Secondary | ICD-10-CM

## 2016-05-28 DIAGNOSIS — M25562 Pain in left knee: Secondary | ICD-10-CM | POA: Diagnosis present

## 2016-05-28 DIAGNOSIS — T8454XD Infection and inflammatory reaction due to internal left knee prosthesis, subsequent encounter: Secondary | ICD-10-CM | POA: Diagnosis not present

## 2016-05-28 DIAGNOSIS — Z8619 Personal history of other infectious and parasitic diseases: Secondary | ICD-10-CM | POA: Diagnosis not present

## 2016-05-28 DIAGNOSIS — E119 Type 2 diabetes mellitus without complications: Secondary | ICD-10-CM | POA: Diagnosis present

## 2016-05-28 HISTORY — PX: TOTAL KNEE REVISION: SHX996

## 2016-05-28 LAB — SYNOVIAL CELL COUNT + DIFF, W/ CRYSTALS
Crystals, Fluid: NONE SEEN
EOSINOPHILS-SYNOVIAL: 0 % (ref 0–1)
Lymphocytes-Synovial Fld: 1 % (ref 0–20)
MONOCYTE-MACROPHAGE-SYNOVIAL FLUID: 13 % — AB (ref 50–90)
NEUTROPHIL, SYNOVIAL: 86 % — AB (ref 0–25)
WBC, SYNOVIAL: 89000 /mm3 — AB (ref 0–200)

## 2016-05-28 LAB — SURGICAL PCR SCREEN
MRSA, PCR: NEGATIVE
Staphylococcus aureus: NEGATIVE

## 2016-05-28 LAB — CBC WITH DIFFERENTIAL/PLATELET
BASOS ABS: 0 10*3/uL (ref 0.0–0.1)
BASOS PCT: 0 %
EOS PCT: 0 %
Eosinophils Absolute: 0 10*3/uL (ref 0.0–0.7)
HEMATOCRIT: 36.2 % — AB (ref 39.0–52.0)
Hemoglobin: 11.7 g/dL — ABNORMAL LOW (ref 13.0–17.0)
Lymphocytes Relative: 12 %
Lymphs Abs: 1.2 10*3/uL (ref 0.7–4.0)
MCH: 29.3 pg (ref 26.0–34.0)
MCHC: 32.3 g/dL (ref 30.0–36.0)
MCV: 90.7 fL (ref 78.0–100.0)
MONO ABS: 1.1 10*3/uL — AB (ref 0.1–1.0)
MONOS PCT: 11 %
NEUTROS ABS: 7.6 10*3/uL (ref 1.7–7.7)
Neutrophils Relative %: 77 %
PLATELETS: 176 10*3/uL (ref 150–400)
RBC: 3.99 MIL/uL — ABNORMAL LOW (ref 4.22–5.81)
RDW: 13.7 % (ref 11.5–15.5)
WBC: 9.8 10*3/uL (ref 4.0–10.5)

## 2016-05-28 LAB — GLUCOSE, CAPILLARY
GLUCOSE-CAPILLARY: 109 mg/dL — AB (ref 65–99)
Glucose-Capillary: 135 mg/dL — ABNORMAL HIGH (ref 65–99)
Glucose-Capillary: 154 mg/dL — ABNORMAL HIGH (ref 65–99)
Glucose-Capillary: 200 mg/dL — ABNORMAL HIGH (ref 65–99)
Glucose-Capillary: 254 mg/dL — ABNORMAL HIGH (ref 65–99)

## 2016-05-28 LAB — GRAM STAIN

## 2016-05-28 LAB — BASIC METABOLIC PANEL
ANION GAP: 7 (ref 5–15)
BUN: 16 mg/dL (ref 6–20)
CALCIUM: 8.9 mg/dL (ref 8.9–10.3)
CO2: 24 mmol/L (ref 22–32)
CREATININE: 1.18 mg/dL (ref 0.61–1.24)
Chloride: 107 mmol/L (ref 101–111)
GLUCOSE: 71 mg/dL (ref 65–99)
Potassium: 4.1 mmol/L (ref 3.5–5.1)
Sodium: 138 mmol/L (ref 135–145)

## 2016-05-28 LAB — TYPE AND SCREEN
ABO/RH(D): O POS
ANTIBODY SCREEN: NEGATIVE

## 2016-05-28 LAB — SEDIMENTATION RATE: Sed Rate: 50 mm/hr — ABNORMAL HIGH (ref 0–16)

## 2016-05-28 LAB — C-REACTIVE PROTEIN: CRP: 2.2 mg/dL — ABNORMAL HIGH (ref <1.0)

## 2016-05-28 LAB — ABO/RH: ABO/RH(D): O POS

## 2016-05-28 SURGERY — TOTAL KNEE REVISION
Anesthesia: General | Site: Knee | Laterality: Left

## 2016-05-28 MED ORDER — HYDROMORPHONE HCL 1 MG/ML IJ SOLN
INTRAMUSCULAR | Status: AC
  Administered 2016-05-28: 0.25 mg via INTRAVENOUS
  Filled 2016-05-28: qty 1

## 2016-05-28 MED ORDER — HEPARIN SODIUM (PORCINE) 5000 UNIT/ML IJ SOLN
5000.0000 [IU] | Freq: Three times a day (TID) | INTRAMUSCULAR | Status: DC
  Filled 2016-05-28: qty 1

## 2016-05-28 MED ORDER — FENTANYL CITRATE (PF) 100 MCG/2ML IJ SOLN
INTRAMUSCULAR | Status: AC
  Filled 2016-05-28: qty 2

## 2016-05-28 MED ORDER — VANCOMYCIN HCL 10 G IV SOLR: 1250.0000 mg | Freq: Once | INTRAVENOUS | Status: DC

## 2016-05-28 MED ORDER — VANCOMYCIN HCL 1000 MG IV SOLR
INTRAVENOUS | Status: DC | PRN
Start: 2016-05-28 — End: 2016-05-28
  Administered 2016-05-28: 2 g

## 2016-05-28 MED ORDER — SODIUM CHLORIDE 0.9 % IV SOLN: Freq: Once | INTRAVENOUS | Status: DC

## 2016-05-28 MED ORDER — CEFAZOLIN SODIUM-DEXTROSE 2-4 GM/100ML-% IV SOLN
2.0000 g | Freq: Three times a day (TID) | INTRAVENOUS | Status: DC
Start: 2016-05-28 — End: 2016-06-01
  Administered 2016-05-28 – 2016-06-01 (×12): 2 g via INTRAVENOUS
  Filled 2016-05-28 (×14): qty 100

## 2016-05-28 MED ORDER — MORPHINE SULFATE (PF) 4 MG/ML IV SOLN
4.0000 mg | INTRAVENOUS | Status: DC | PRN
  Administered 2016-05-28: 4 mg via INTRAVENOUS
  Filled 2016-05-28: qty 1

## 2016-05-28 MED ORDER — TOBRAMYCIN SULFATE 1.2 G IJ SOLR
INTRAMUSCULAR | Status: DC | PRN
  Administered 2016-05-28: 2.4 g

## 2016-05-28 MED ORDER — MORPHINE SULFATE (PF) 4 MG/ML IV SOLN
6.0000 mg | Freq: Once | INTRAVENOUS | Status: AC
  Administered 2016-05-28: 6 mg via INTRAVENOUS
  Filled 2016-05-28: qty 2

## 2016-05-28 MED ORDER — SUGAMMADEX SODIUM 200 MG/2ML IV SOLN
INTRAVENOUS | Status: AC
  Filled 2016-05-28: qty 4

## 2016-05-28 MED ORDER — INSULIN ASPART 100 UNIT/ML ~~LOC~~ SOLN
0.0000 [IU] | Freq: Three times a day (TID) | SUBCUTANEOUS | Status: DC
  Administered 2016-05-29: 11 [IU] via SUBCUTANEOUS
  Administered 2016-05-29 (×2): 15 [IU] via SUBCUTANEOUS
  Administered 2016-05-30 – 2016-05-31 (×5): 7 [IU] via SUBCUTANEOUS
  Administered 2016-06-01: 11 [IU] via SUBCUTANEOUS
  Administered 2016-06-01: 7 [IU] via SUBCUTANEOUS

## 2016-05-28 MED ORDER — PROPOFOL 10 MG/ML IV BOLUS
INTRAVENOUS | Status: AC
  Filled 2016-05-28: qty 20

## 2016-05-28 MED ORDER — CLOPIDOGREL BISULFATE 75 MG PO TABS
75.0000 mg | ORAL_TABLET | Freq: Every day | ORAL | Status: DC
  Administered 2016-05-28 – 2016-06-01 (×5): 75 mg via ORAL
  Filled 2016-05-28 (×5): qty 1

## 2016-05-28 MED ORDER — DEXMEDETOMIDINE HCL 200 MCG/2ML IV SOLN
INTRAVENOUS | Status: DC | PRN
  Administered 2016-05-28: 90 ug via INTRAVENOUS

## 2016-05-28 MED ORDER — PHENYLEPHRINE HCL 10 MG/ML IJ SOLN
INTRAVENOUS | Status: DC | PRN
  Administered 2016-05-28: 20 ug/min via INTRAVENOUS

## 2016-05-28 MED ORDER — ROCURONIUM BROMIDE 10 MG/ML (PF) SYRINGE
PREFILLED_SYRINGE | INTRAVENOUS | Status: AC
Start: 2016-05-28 — End: 2016-05-28
  Filled 2016-05-28: qty 20

## 2016-05-28 MED ORDER — HYDROMORPHONE HCL 1 MG/ML IJ SOLN: 1.0000 mg | INTRAMUSCULAR | Status: DC | PRN

## 2016-05-28 MED ORDER — DEXMEDETOMIDINE HCL IN NACL 200 MCG/50ML IV SOLN
INTRAVENOUS | Status: AC
  Filled 2016-05-28: qty 50

## 2016-05-28 MED ORDER — SODIUM CHLORIDE 0.9 % IV SOLN
INTRAVENOUS | Status: DC
  Administered 2016-05-28 – 2016-06-01 (×4): via INTRAVENOUS

## 2016-05-28 MED ORDER — PROPOFOL 10 MG/ML IV BOLUS
INTRAVENOUS | Status: DC | PRN
  Administered 2016-05-28: 120 mg via INTRAVENOUS

## 2016-05-28 MED ORDER — BUPIVACAINE LIPOSOME 1.3 % IJ SUSP
20.0000 mL | INTRAMUSCULAR | Status: DC
  Filled 2016-05-28: qty 20

## 2016-05-28 MED ORDER — METHOCARBAMOL 1000 MG/10ML IJ SOLN
500.0000 mg | Freq: Four times a day (QID) | INTRAVENOUS | Status: DC | PRN
  Filled 2016-05-28: qty 5

## 2016-05-28 MED ORDER — VANCOMYCIN HCL 1000 MG IV SOLR
INTRAVENOUS | Status: AC
  Filled 2016-05-28: qty 1000

## 2016-05-28 MED ORDER — POVIDONE-IODINE 10 % EX SWAB: 2.0000 "application " | Freq: Once | CUTANEOUS | Status: DC

## 2016-05-28 MED ORDER — LIDOCAINE-EPINEPHRINE 2 %-1:100000 IJ SOLN
30.0000 mL | Freq: Once | INTRAMUSCULAR | Status: AC
  Administered 2016-05-28: 30 mL
  Filled 2016-05-28: qty 30

## 2016-05-28 MED ORDER — BUPIVACAINE LIPOSOME 1.3 % IJ SUSP
20.0000 mL | INTRAMUSCULAR | Status: AC
  Filled 2016-05-28: qty 20

## 2016-05-28 MED ORDER — 0.9 % SODIUM CHLORIDE (POUR BTL) OPTIME
TOPICAL | Status: DC | PRN
  Administered 2016-05-28: 1000 mL

## 2016-05-28 MED ORDER — MEPERIDINE HCL 25 MG/ML IJ SOLN: 6.2500 mg | INTRAMUSCULAR | Status: DC | PRN

## 2016-05-28 MED ORDER — ONDANSETRON HCL 4 MG PO TABS: 4.0000 mg | ORAL_TABLET | Freq: Four times a day (QID) | ORAL | Status: DC | PRN

## 2016-05-28 MED ORDER — ACETAMINOPHEN 650 MG RE SUPP: 650.0000 mg | Freq: Four times a day (QID) | RECTAL | Status: DC | PRN

## 2016-05-28 MED ORDER — PIPERACILLIN-TAZOBACTAM 3.375 G IVPB 30 MIN
3.3750 g | Freq: Once | INTRAVENOUS | Status: AC
  Administered 2016-05-28: 3.375 g via INTRAVENOUS
  Filled 2016-05-28: qty 50

## 2016-05-28 MED ORDER — ROCURONIUM BROMIDE 100 MG/10ML IV SOLN
INTRAVENOUS | Status: DC | PRN
  Administered 2016-05-28 (×2): 50 mg via INTRAVENOUS

## 2016-05-28 MED ORDER — FENTANYL CITRATE (PF) 100 MCG/2ML IJ SOLN
INTRAMUSCULAR | Status: DC | PRN
  Administered 2016-05-28: 50 ug via INTRAVENOUS
  Administered 2016-05-28: 100 ug via INTRAVENOUS
  Administered 2016-05-28 (×3): 50 ug via INTRAVENOUS

## 2016-05-28 MED ORDER — CHLORHEXIDINE GLUCONATE 4 % EX LIQD: 60.0000 mL | Freq: Once | CUTANEOUS | Status: DC

## 2016-05-28 MED ORDER — PHENYLEPHRINE HCL 10 MG/ML IJ SOLN
INTRAMUSCULAR | Status: DC | PRN
  Administered 2016-05-28 (×2): 80 ug via INTRAVENOUS

## 2016-05-28 MED ORDER — HYDROMORPHONE HCL 1 MG/ML IJ SOLN
0.2500 mg | INTRAMUSCULAR | Status: DC | PRN
  Administered 2016-05-28 (×2): 0.25 mg via INTRAVENOUS

## 2016-05-28 MED ORDER — METHOCARBAMOL 500 MG PO TABS
500.0000 mg | ORAL_TABLET | Freq: Four times a day (QID) | ORAL | Status: DC | PRN
  Administered 2016-05-28 – 2016-06-01 (×6): 500 mg via ORAL
  Filled 2016-05-28 (×7): qty 1

## 2016-05-28 MED ORDER — CEFAZOLIN SODIUM 1 G IJ SOLR
INTRAMUSCULAR | Status: AC
  Filled 2016-05-28: qty 40

## 2016-05-28 MED ORDER — MORPHINE SULFATE (PF) 4 MG/ML IV SOLN
4.0000 mg | Freq: Once | INTRAVENOUS | Status: AC
  Administered 2016-05-28: 4 mg via INTRAVENOUS
  Filled 2016-05-28: qty 1

## 2016-05-28 MED ORDER — SODIUM CHLORIDE 0.9 % IR SOLN
Status: DC | PRN
  Administered 2016-05-28 (×3): 3000 mL

## 2016-05-28 MED ORDER — BUPIVACAINE HCL (PF) 0.25 % IJ SOLN
INTRAMUSCULAR | Status: AC
  Filled 2016-05-28: qty 30

## 2016-05-28 MED ORDER — LIDOCAINE HCL (CARDIAC) 20 MG/ML IV SOLN
INTRAVENOUS | Status: DC | PRN
  Administered 2016-05-28: 100 mg via INTRAVENOUS

## 2016-05-28 MED ORDER — ONDANSETRON HCL 4 MG/2ML IJ SOLN
INTRAMUSCULAR | Status: AC
  Filled 2016-05-28: qty 2

## 2016-05-28 MED ORDER — ONDANSETRON HCL 4 MG/2ML IJ SOLN: 4.0000 mg | Freq: Once | INTRAMUSCULAR | Status: DC | PRN

## 2016-05-28 MED ORDER — LACTATED RINGERS IV SOLN
INTRAVENOUS | Status: DC
  Administered 2016-05-28 (×2): via INTRAVENOUS

## 2016-05-28 MED ORDER — ACETAMINOPHEN 325 MG PO TABS
650.0000 mg | ORAL_TABLET | Freq: Four times a day (QID) | ORAL | Status: DC | PRN
Start: 2016-05-28 — End: 2016-06-01

## 2016-05-28 MED ORDER — CEFUROXIME SODIUM 1.5 G IJ SOLR
INTRAMUSCULAR | Status: AC
  Filled 2016-05-28: qty 1.5

## 2016-05-28 MED ORDER — METOCLOPRAMIDE HCL 5 MG/ML IJ SOLN
5.0000 mg | Freq: Three times a day (TID) | INTRAMUSCULAR | Status: DC | PRN
  Administered 2016-05-29: 10 mg via INTRAVENOUS
  Filled 2016-05-28: qty 2

## 2016-05-28 MED ORDER — DIPHENHYDRAMINE HCL 12.5 MG/5ML PO ELIX: 12.5000 mg | ORAL_SOLUTION | ORAL | Status: DC | PRN

## 2016-05-28 MED ORDER — PHENYLEPHRINE 40 MCG/ML (10ML) SYRINGE FOR IV PUSH (FOR BLOOD PRESSURE SUPPORT)
PREFILLED_SYRINGE | INTRAVENOUS | Status: AC
  Filled 2016-05-28: qty 10

## 2016-05-28 MED ORDER — CEFAZOLIN SODIUM-DEXTROSE 2-4 GM/100ML-% IV SOLN: 2.0000 g | Freq: Three times a day (TID) | INTRAVENOUS | Status: DC

## 2016-05-28 MED ORDER — EPHEDRINE SULFATE 50 MG/ML IJ SOLN
INTRAMUSCULAR | Status: DC | PRN
  Administered 2016-05-28 (×2): 10 mg via INTRAVENOUS

## 2016-05-28 MED ORDER — TOBRAMYCIN SULFATE 1.2 G IJ SOLR
INTRAMUSCULAR | Status: AC
  Filled 2016-05-28: qty 1.2

## 2016-05-28 MED ORDER — SODIUM CHLORIDE 0.9 % IV SOLN
1250.0000 mg | Freq: Two times a day (BID) | INTRAVENOUS | Status: DC
Start: 2016-05-28 — End: 2016-05-28
  Filled 2016-05-28 (×2): qty 1250

## 2016-05-28 MED ORDER — PHENYLEPHRINE 40 MCG/ML (10ML) SYRINGE FOR IV PUSH (FOR BLOOD PRESSURE SUPPORT)
PREFILLED_SYRINGE | INTRAVENOUS | Status: AC
  Filled 2016-05-28: qty 20

## 2016-05-28 MED ORDER — SUGAMMADEX SODIUM 200 MG/2ML IV SOLN
INTRAVENOUS | Status: DC | PRN
  Administered 2016-05-28: 300 mg via INTRAVENOUS

## 2016-05-28 MED ORDER — ONDANSETRON HCL 4 MG/2ML IJ SOLN
4.0000 mg | Freq: Once | INTRAMUSCULAR | Status: AC
  Administered 2016-05-28: 4 mg via INTRAVENOUS
  Filled 2016-05-28: qty 2

## 2016-05-28 MED ORDER — ONDANSETRON HCL 4 MG/2ML IJ SOLN
4.0000 mg | Freq: Four times a day (QID) | INTRAMUSCULAR | Status: DC | PRN
  Administered 2016-05-29 – 2016-05-31 (×2): 4 mg via INTRAVENOUS
  Filled 2016-05-28 (×2): qty 2

## 2016-05-28 MED ORDER — VANCOMYCIN HCL 10 G IV SOLR
2000.0000 mg | Freq: Once | INTRAVENOUS | Status: AC
  Administered 2016-05-28: 2000 mg via INTRAVENOUS
  Filled 2016-05-28: qty 2000

## 2016-05-28 MED ORDER — OXYCODONE HCL 5 MG PO TABS
5.0000 mg | ORAL_TABLET | ORAL | Status: DC | PRN
  Administered 2016-05-28 – 2016-05-29 (×5): 10 mg via ORAL
  Filled 2016-05-28 (×7): qty 2

## 2016-05-28 MED ORDER — ONDANSETRON HCL 4 MG/2ML IJ SOLN
INTRAMUSCULAR | Status: DC | PRN
  Administered 2016-05-28: 4 mg via INTRAVENOUS

## 2016-05-28 MED ORDER — TOBRAMYCIN SULFATE 1.2 G IJ SOLR
INTRAMUSCULAR | Status: AC
Start: 2016-05-28 — End: 2016-05-28
  Filled 2016-05-28: qty 1.2

## 2016-05-28 MED ORDER — METOCLOPRAMIDE HCL 5 MG PO TABS: 5.0000 mg | ORAL_TABLET | Freq: Three times a day (TID) | ORAL | Status: DC | PRN

## 2016-05-28 SURGICAL SUPPLY — 72 items
BANDAGE ACE 6X5 VEL STRL LF (GAUZE/BANDAGES/DRESSINGS) ×3 IMPLANT
BANDAGE ELASTIC 6 VELCRO ST LF (GAUZE/BANDAGES/DRESSINGS) ×6 IMPLANT
BANDAGE ESMARK 6X9 LF (GAUZE/BANDAGES/DRESSINGS) ×1 IMPLANT
BLADE LONG MED 31MMX9MM (MISCELLANEOUS) ×1
BLADE LONG MED 31X9 (MISCELLANEOUS) ×2 IMPLANT
BLADE SAG 18X100X1.27 (BLADE) IMPLANT
BLADE SAGITTAL 25.0X1.27X90 (BLADE) IMPLANT
BLADE SAGITTAL 25.0X1.27X90MM (BLADE)
BLADE SURG 10 STRL SS (BLADE) ×6 IMPLANT
BLADE SURG ROTATE 9660 (MISCELLANEOUS) IMPLANT
BNDG COHESIVE 6X5 TAN STRL LF (GAUZE/BANDAGES/DRESSINGS) ×6 IMPLANT
BNDG ESMARK 6X9 LF (GAUZE/BANDAGES/DRESSINGS) ×3
BONE CEMENT GENTAMICIN (Cement) ×12 IMPLANT
BOWL SMART MIX CTS (DISPOSABLE) ×6 IMPLANT
CEMENT BONE GENTAMICIN 40 (Cement) ×4 IMPLANT
COVER SURGICAL LIGHT HANDLE (MISCELLANEOUS) ×3 IMPLANT
CUFF TOURNIQUET SINGLE 34IN LL (TOURNIQUET CUFF) IMPLANT
CUFF TOURNIQUET SINGLE 44IN (TOURNIQUET CUFF) IMPLANT
DRAPE IMP U-DRAPE 54X76 (DRAPES) ×3 IMPLANT
DRAPE ORTHO SPLIT 77X108 STRL (DRAPES) ×4
DRAPE SURG ORHT 6 SPLT 77X108 (DRAPES) ×2 IMPLANT
DRAPE U-SHAPE 47X51 STRL (DRAPES) ×3 IMPLANT
DRSG PAD ABDOMINAL 8X10 ST (GAUZE/BANDAGES/DRESSINGS) ×6 IMPLANT
DURAPREP 26ML APPLICATOR (WOUND CARE) ×3 IMPLANT
ELECT CAUTERY BLADE 6.4 (BLADE) ×3 IMPLANT
ELECT REM PT RETURN 9FT ADLT (ELECTROSURGICAL) ×3
ELECTRODE REM PT RTRN 9FT ADLT (ELECTROSURGICAL) ×1 IMPLANT
EVACUATOR 1/8 PVC DRAIN (DRAIN) IMPLANT
FACESHIELD STD STERILE (MASK) ×6 IMPLANT
GAUZE SPONGE 4X4 12PLY STRL (GAUZE/BANDAGES/DRESSINGS) ×3 IMPLANT
GAUZE XEROFORM 1X8 LF (GAUZE/BANDAGES/DRESSINGS) ×3 IMPLANT
GLOVE BIO SURGEON STRL SZ8 (GLOVE) ×3 IMPLANT
GLOVE BIOGEL PI IND STRL 8 (GLOVE) ×2 IMPLANT
GLOVE BIOGEL PI INDICATOR 8 (GLOVE) ×4
GLOVE ORTHO TXT STRL SZ7.5 (GLOVE) ×3 IMPLANT
GOWN STRL REUS W/ TWL LRG LVL3 (GOWN DISPOSABLE) ×2 IMPLANT
GOWN STRL REUS W/ TWL XL LVL3 (GOWN DISPOSABLE) ×1 IMPLANT
GOWN STRL REUS W/TWL LRG LVL3 (GOWN DISPOSABLE) ×4
GOWN STRL REUS W/TWL XL LVL3 (GOWN DISPOSABLE) ×2
HANDPIECE INTERPULSE COAX TIP (DISPOSABLE) ×2
IMMOBILIZER KNEE 22 UNIV (SOFTGOODS) ×3 IMPLANT
KIT BASIN OR (CUSTOM PROCEDURE TRAY) ×3 IMPLANT
KIT ROOM TURNOVER OR (KITS) ×3 IMPLANT
MANIFOLD NEPTUNE II (INSTRUMENTS) ×3 IMPLANT
MOLD SPACER FEM KNEE 53A/PX75M (Spacer) ×1 IMPLANT
NS IRRIG 1000ML POUR BTL (IV SOLUTION) ×3 IMPLANT
PACK TOTAL JOINT (CUSTOM PROCEDURE TRAY) ×3 IMPLANT
PACK UNIVERSAL I (CUSTOM PROCEDURE TRAY) IMPLANT
PAD ARMBOARD 7.5X6 YLW CONV (MISCELLANEOUS) ×6 IMPLANT
PADDING CAST COTTON 6X4 STRL (CAST SUPPLIES) ×6 IMPLANT
RASP HELIOCORDIAL MED (MISCELLANEOUS) IMPLANT
SET HNDPC FAN SPRY TIP SCT (DISPOSABLE) ×1 IMPLANT
SET PAD KNEE POSITIONER (MISCELLANEOUS) ×3 IMPLANT
SPACER KASM MOLD44APX70ML KNEE (Spacer) ×3 IMPLANT
SPACERMOLD FEM KNEE 53A/PX75M (Spacer) ×3 IMPLANT
SPONGE GAUZE 4X4 12PLY STER LF (GAUZE/BANDAGES/DRESSINGS) ×3 IMPLANT
STAPLER VISISTAT 35W (STAPLE) ×3 IMPLANT
SUCTION FRAZIER HANDLE 10FR (MISCELLANEOUS) ×2
SUCTION TUBE FRAZIER 10FR DISP (MISCELLANEOUS) ×1 IMPLANT
SUT VIC AB 0 CT1 27 (SUTURE) ×4
SUT VIC AB 0 CT1 27XBRD ANBCTR (SUTURE) ×2 IMPLANT
SUT VIC AB 1 CT1 27 (SUTURE) ×8
SUT VIC AB 1 CT1 27XBRD ANBCTR (SUTURE) ×4 IMPLANT
SUT VIC AB 2-0 CT1 27 (SUTURE) ×4
SUT VIC AB 2-0 CT1 TAPERPNT 27 (SUTURE) ×2 IMPLANT
SWAB COLLECTION DEVICE MRSA (MISCELLANEOUS) IMPLANT
TOWEL OR 17X24 6PK STRL BLUE (TOWEL DISPOSABLE) ×3 IMPLANT
TOWEL OR 17X26 10 PK STRL BLUE (TOWEL DISPOSABLE) ×3 IMPLANT
TRAY FOLEY CATH 16FRSI W/METER (SET/KITS/TRAYS/PACK) IMPLANT
TUBE ANAEROBIC SPECIMEN COL (MISCELLANEOUS) IMPLANT
WATER STERILE IRR 1000ML POUR (IV SOLUTION) IMPLANT
WRAP KNEE MAXI GEL POST OP (GAUZE/BANDAGES/DRESSINGS) ×3 IMPLANT

## 2016-05-28 NOTE — Anesthesia Procedure Notes (Signed)
Procedure Name: Intubation Date/Time: 05/28/2016 5:19 PM Performed by: Geraldo DockerSOLHEIM, Amaad Byers SALOMON Pre-anesthesia Checklist: Patient identified, Patient being monitored, Timeout performed, Emergency Drugs available and Suction available Patient Re-evaluated:Patient Re-evaluated prior to inductionOxygen Delivery Method: Circle System Utilized Preoxygenation: Pre-oxygenation with 100% oxygen Intubation Type: IV induction Ventilation: Oral airway inserted - appropriate to patient size, Two handed mask ventilation required and Mask ventilation without difficulty Laryngoscope Size: Miller and 3 Grade View: Grade IV Tube type: Oral Tube size: 7.5 mm Number of attempts: 1 Airway Equipment and Method: Stylet Placement Confirmation: ETT inserted through vocal cords under direct vision,  positive ETCO2 and breath sounds checked- equal and bilateral Secured at: 23 cm Tube secured with: Tape Dental Injury: Teeth and Oropharynx as per pre-operative assessment  Comments: Pt morbidly obese, pt ramped and preoxygenated x 5 min with 7mmHg CPAP. Glidescope in room and available but not needed. Would recommend keeping it easily available for future procedures.

## 2016-05-28 NOTE — Consult Note (Signed)
Reason for Consult:  Septic/infected left total knee replacement Referring Physician:   EDP  Marc Vasquez is an 70 y.o. male.  HPI:   70 yo male who had his left knee replaced in 2014 in [place].  He developed an infection sometime earlier this year and, being from [place], has an irrigation and debridement/synovectomy of that knee and a poly-liner exchange.  He has been on long-term oral antibiotics, but developed acute left knee swelling and pain about 48 hours ago.  He presented to the [facility] ED this am and an aspiration of the left knee revealed cloudy material and a WBC count of 89,000.  Given these findings, her was admitted to the Teaching Service and started on IV antibiotics.  He has been followed by Dr. Carlyle Basques with Infectious Disease.  His original infection per previous notes was MSSA.  Given the high WBC in the knee now, he has an obvious chronic infection and needs to have all components removed.  Ortho is consulted for eval and Tx.  Past Medical History:  Diagnosis Date  . Depression   . 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 Former smoker quit on 12/15/15  . Generalized OA   . History of left knee replacement   . History of stroke   . Hypercholesteremia   . Infection of prosthetic left knee joint ([place])   . Left hip pain   . Lumbago   . OSA (obstructive sleep apnea)   . Poorly controlled diabetes mellitus ([place])     History reviewed. No pertinent surgical history.  Family History  Problem Relation Age of Onset  . Heart disease    . Diabetes      Social History:  reports that he quit smoking about 5 months ago. His smoking use included Cigarettes. He has a 15.00 pack-year smoking history. He has never used smokeless tobacco. He reports that he drinks about 0.6 oz of alcohol per week . He reports that he does not use drugs.  Allergies:  Allergies  Allergen Reactions  . Oxycontin [Oxycodone Hcl] Itching     Medications: I have reviewed the patient's current medications.  Results for orders placed or performed during the hospital encounter of 05/28/16 (from the past 48 hour(s))  CBC with Differential/Platelet     Status: Abnormal   Collection Time: 05/28/16  2:19 AM  Result Value Ref Range   WBC 9.8 4.0 - 10.5 K/uL   RBC 3.99 (L) 4.22 - 5.81 MIL/uL   Hemoglobin 11.7 (L) 13.0 - 17.0 g/dL   HCT 36.2 (L) 39.0 - 52.0 %   MCV 90.7 78.0 - 100.0 fL   MCH 29.3 26.0 - 34.0 pg   MCHC 32.3 30.0 - 36.0 g/dL   RDW 13.7 11.5 - 15.5 %   Platelets 176 150 - 400 K/uL   Neutrophils Relative % 77 %   Neutro Abs 7.6 1.7 - 7.7 K/uL   Lymphocytes Relative 12 %   Lymphs Abs 1.2 0.7 - 4.0 K/uL   Monocytes Relative 11 %   Monocytes Absolute 1.1 (H) 0.1 - 1.0 K/uL   Eosinophils Relative 0 %   Eosinophils Absolute 0.0 0.0 - 0.7 K/uL   Basophils Relative 0 %   Basophils Absolute 0.0 0.0 - 0.1 K/uL  Basic metabolic panel     Status: None   Collection Time: 05/28/16  2:19 AM  Result Value Ref Range   Sodium 138 135 - 145 mmol/L  Potassium 4.1 3.5 - 5.1 mmol/L   Chloride 107 101 - 111 mmol/L   CO2 24 22 - 32 mmol/L   Glucose, Bld 71 65 - 99 mg/dL   BUN 16 6 - 20 mg/dL   Creatinine, Ser 1.18 0.61 - 1.24 mg/dL   Calcium 8.9 8.9 - 10.3 mg/dL   GFR calc non Af Amer >60 >60 mL/min   GFR calc Af Amer >60 >60 mL/min    Comment: (NOTE) The eGFR has been calculated using the CKD EPI equation. This calculation has not been validated in all clinical situations. eGFR's persistently <60 mL/min signify possible Chronic Kidney Disease.    Anion gap 7 5 - 15  Sedimentation rate     Status: Abnormal   Collection Time: 05/28/16  2:19 AM  Result Value Ref Range   Sed Rate 50 (H) 0 - 16 mm/hr  C-reactive protein     Status: Abnormal   Collection Time: 05/28/16  2:19 AM  Result Value Ref Range   CRP 2.2 (H) <1.0 mg/dL  Synovial cell count + diff, w/ crystals     Status: Abnormal   Collection Time: 05/28/16   4:20 AM  Result Value Ref Range   Color, Synovial YELLOW (A) YELLOW   Appearance-Synovial CLOUDY (A) CLEAR   Crystals, Fluid NO CRYSTALS SEEN    WBC, Synovial 89,000 (H) 0 - 200 /cu mm   Neutrophil, Synovial 86 (H) 0 - 25 %   Lymphocytes-Synovial Fld 1 0 - 20 %   Monocyte-Macrophage-Synovial Fluid 13 (L) 50 - 90 %   Eosinophils-Synovial 0 0 - 1 %  Gram stain     Status: None   Collection Time: 05/28/16  4:25 AM  Result Value Ref Range   Specimen Description SYNOVIAL LEFT KNEE    Special Requests NONE    Gram Stain      ABUNDANT WBC PRESENT, PREDOMINANTLY PMN NO ORGANISMS SEEN    Report Status 05/28/2016 FINAL   Type and screen [facility]     Status: None   Collection Time: 05/28/16  8:15 AM  Result Value Ref Range   ABO/RH(D) O POS    Antibody Screen NEG    Sample Expiration 05/31/2016   ABO/Rh     Status: None   Collection Time: 05/28/16  8:15 AM  Result Value Ref Range   ABO/RH(D) O POS   Glucose, capillary     Status: Abnormal   Collection Time: 05/28/16 11:57 AM  Result Value Ref Range   Glucose-Capillary 109 (H) 65 - 99 mg/dL  Surgical pcr screen     Status: None   Collection Time: 05/28/16  1:34 PM  Result Value Ref Range   MRSA, PCR NEGATIVE NEGATIVE   Staphylococcus aureus NEGATIVE NEGATIVE    Comment:        The Xpert SA Assay (FDA approved for NASAL specimens in patients over 48 years of age), is one component of a comprehensive surveillance program.  Test performance has been validated by [facility] for patients greater than or equal to 61 year old. It is not intended to diagnose infection nor to guide or monitor treatment.   Glucose, capillary     Status: Abnormal   Collection Time: 05/28/16  3:42 PM  Result Value Ref Range   Glucose-Capillary 135 (H) 65 - 99 mg/dL   Comment 1 Repeat Test    Comment 2 Document in Chart     Dg Chest Portable 1 View  Result Date: 05/28/2016  CLINICAL DATA:  Preoperative for left knee surgery.   Diabetes. EXAM: PORTABLE CHEST 1 VIEW COMPARISON:  Multiple exams, including 12/14/2015 FINDINGS: Slight upper zone pulmonary vascular prominence but no cardiomegaly. No edema or discrete airspace opacity. No pleural effusion identified. IMPRESSION: No active cardiopulmonary disease is radiographically apparent. Electronically Signed   By: Van Clines M.D.   On: 05/28/2016 09:32   Dg Knee Complete 4 Views Left  Result Date: 05/28/2016 CLINICAL DATA:  70 y/o M; pain to anterior knee. History of knee sepsis with knee replacement. EXAM: LEFT KNEE - COMPLETE 4+ VIEW COMPARISON:  Knee radiographs 12/15/2015. FINDINGS: No acute fracture or dislocation is identified. Total knee replacement and patellar resurfacing with prosthesis. There has been development of periprosthetic lucency at the medial and lateral margins of the tibial plateau component. Small joint effusion. IMPRESSION: 1. No acute fracture or dislocation.  Intact hardware. 2. Small joint effusion. 3. Interval development of mild periprosthetic lucency in medial lateral margins of the tibial plateau component may represent loosening. Electronically Signed   By: Kristine Garbe M.D.   On: 05/28/2016 02:20    Review of Systems  Musculoskeletal: Positive for joint pain.  All other systems reviewed and are negative.  Blood pressure (!) 140/55, pulse 78, temperature 98.9 F (37.2 C), resp. rate 18, height '5\' 11"'  (1.803 m), weight (!) 138.3 kg (305 lb), SpO2 97 %. Physical Exam  Constitutional: He is oriented to person, place, and time. He appears well-developed and well-nourished.  HENT:  Head: Normocephalic and atraumatic.  Eyes: EOM are normal. Pupils are equal, round, and reactive to light.  Neck: Normal range of motion. Neck supple.  Cardiovascular: Normal rate and regular rhythm.   Respiratory: Effort normal and breath sounds normal.  GI: Soft. Bowel sounds are normal.  Musculoskeletal:       Left knee: He exhibits  decreased range of motion, swelling and effusion. Tenderness found. Medial joint line and lateral joint line tenderness noted.  Neurological: He is alert and oriented to person, place, and time.  Skin: Skin is warm and dry.  Psychiatric: He has a normal mood and affect.    Assessment/Plan: Infected left total knee arthroplasty 1)  Given the fact he has failed a previous I&D and poly exchange earlier this year down in [place] as well as failed antibiotic therapy, an excision of all components is warranted in order to sterilize the knee.  I have spoken to him and his wife about this in detail and explained the risks and benefits involved.  We will proceed to the OR today to excise the hardware and plan an antibiotic spacer.  He will then need to be on a prolonged course of IV antibiotics before considering a revision knee replacement.  Mcarthur Rossetti 05/28/2016, 4:16 PM

## 2016-05-28 NOTE — Progress Notes (Signed)
Patient ID: Marc SprinklesMichael R Haughey, male   DOB: 09/11/1946, 70 y.o.   MRN: [id number] I just spoke with his daughter who is a Teacher, early years/prepharmacist.  She said that when he has been on Vancomycin before that his creatinine bumped up significantly.  Given the fact that his infection earlier this year grew out MSSA and given his renal issues, will stop the Vanc and zosyn and start on Ancef for now.

## 2016-05-28 NOTE — Anesthesia Preprocedure Evaluation (Addendum)
Anesthesia Evaluation  Patient identified by MRN, date of birth, ID band Patient awake    Reviewed: NPO status , Patient's Chart, lab work & pertinent test results  History of Anesthesia Complications Negative for: history of anesthetic complications  Airway Mallampati: II   Neck ROM: Full  Mouth opening: Limited Mouth Opening  Dental  (+) Teeth Intact, Caps,    Pulmonary shortness of breath, sleep apnea , former smoker,    Pulmonary exam normal breath sounds clear to auscultation       Cardiovascular Exercise Tolerance: Poor hypertension, Pt. on medications + Peripheral Vascular Disease and + DOE  Normal cardiovascular exam Rhythm:Regular Rate:Normal     Neuro/Psych PSYCHIATRIC DISORDERS Depression CVA 2004, slight slurred speech, difficulty with balance CVA, Residual Symptoms    GI/Hepatic hiatal hernia, GERD  Medicated and Controlled,  Endo/Other  diabetes, Poorly Controlled, Type 2, Insulin DependentMorbid obesity  Renal/GU CRFRenal disease Bladder dysfunction      Musculoskeletal  (+) Arthritis , Osteoarthritis,    Abdominal (+) + obese,  Abdomen: soft. Bowel sounds: normal.  Peds  Hematology   Anesthesia Other Findings   Reproductive/Obstetrics                           BP Readings from Last 3 Encounters:  05/28/16 (!) 140/55  03/15/16 (!) 156/71  02/05/16 (!) 160/75   Lab Results  Component Value Date   WBC 9.8 05/28/2016   HGB 11.7 (L) 05/28/2016   HCT 36.2 (L) 05/28/2016   MCV 90.7 05/28/2016   PLT 176 05/28/2016     Chemistry      Component Value Date/Time   NA 138 05/28/2016 0219   K 4.1 05/28/2016 0219   CL 107 05/28/2016 0219   CO2 24 05/28/2016 0219   BUN 16 05/28/2016 0219   CREATININE 1.18 05/28/2016 0219   CREATININE 1.07 02/05/2016 1612      Component Value Date/Time   CALCIUM 8.9 05/28/2016 0219   ALKPHOS 162 (H) 01/07/2016 1414   AST 64 (H)  01/07/2016 1414   ALT 9 01/07/2016 1414   BILITOT 0.4 01/07/2016 1414     No results found for: HGBA1C  ECG 05/28/16: Sinus rhythm Left axis deviation  Anesthesia Physical Anesthesia Plan  ASA: III  Anesthesia Plan: General   Post-op Pain Management:    Induction: Intravenous  Airway Management Planned: Video Laryngoscope Planned  Additional Equipment:   Intra-op Plan: Utilization of Controlled Hypotension per surrgeon request  Post-operative Plan: Extubation in OR and Possible Post-op intubation/ventilation  Informed Consent: I have reviewed the patients History and Physical, chart, labs and discussed the procedure including the risks, benefits and alternatives for the proposed anesthesia with the patient or authorized representative who has indicated his/her understanding and acceptance.   Dental advisory given  Plan Discussed with: Surgeon and CRNA  Anesthesia Plan Comments: (Ramp pt, glidescope available, good preoxygenation, reverse trendelenburg during induction)       Anesthesia Quick Evaluation

## 2016-05-28 NOTE — Transfer of Care (Signed)
Immediate Anesthesia Transfer of Care Note  Patient: Marc Vasquez  Procedure(s) Performed: Procedure(s): REMOVAL OF ALL COMPONENTS OF LEFT TOTAL KNEE AND PLACEMENT OF ANTIBIOTIC SPACER (Left)  Patient Location: PACU  Anesthesia Type:General  Level of Consciousness: sedated and patient cooperative  Airway & Oxygen Therapy: Patient connected to face mask oxygen  Post-op Assessment: Report given to RN and Post -op Vital signs reviewed and stable  Post vital signs: Reviewed and stable  Last Vitals:  Vitals:   05/28/16 1000 05/28/16 1555  BP: 137/65 (!) 140/55  Pulse: 78 78  Resp:  18  Temp: 36.9 C 37.2 C    Last Pain:  Vitals:   05/28/16 1945  TempSrc:   PainSc: Asleep         Complications: No apparent anesthesia complications

## 2016-05-28 NOTE — Progress Notes (Signed)
Pharmacy Antibiotic Note Marc SprinklesMichael R Vasquez is a 70 y.o. male admitted on 05/28/2016 with L total knee infection in setting of TKA in 2014 and previous MSSA prosthetic joint infection requiring 6 week of IV abx followed by plan 6 months of PO with keflex/rifampin. Pharmacy has been consulted for vancomycin dosing.  Plan: 1. Vancomycin 2000 mg loading dose given this am, will start 1250 mg every 12 hours for maintenance dose this evening  2. [facility]; goal 15-20 3. BMP every 72 hours while on vancomycin  4. Defer to primary team on starting or resuming coverage for MSSA with a cephalosporin until culture data is known   Height: 5\' 11"  (180.3 cm) Weight: (!) 305 lb (138.3 kg) IBW/kg (Calculated) : 75.3  Temp (24hrs), Avg:98.4 F (36.9 C), Min:97.7 F (36.5 C), Max:98.9 F (37.2 C)   Recent Labs Lab 05/28/16 0219  WBC 9.8  CREATININE 1.18    Estimated Creatinine Clearance: 82.8 mL/min (by C-G formula based on SCr of 1.18 mg/dL).    Allergies  Allergen Reactions  . Oxycontin [Oxycodone Hcl] Itching    Antimicrobials this admission: 8/25 Zosyn x1  8/25 vancomycin  >>   Dose adjustments this admission: n/a  Microbiology results: 8/25 BCx: px Hx of MSSA   Thank you for allowing pharmacy to be a part of this patient's care.  Pollyann SamplesAndy Emiley Digiacomo, PharmD, BCPS 05/28/2016, 11:09 AM Pager: [phone]

## 2016-05-28 NOTE — Progress Notes (Signed)
Patient ID: Marc SprinklesMichael R Poland, male   DOB: 06/09/1946, 70 y.o.   MRN: [id number] I have spoken to his wife by phone and have recommended an excision arthroplasty (removing all components) of his left total knee and placement of an antibiotic spacer in an attempt to successfully treat his on going left knee infection.  Will proceed to the OR this evening.  Risks and benefits discussed in detail.

## 2016-05-28 NOTE — ED Notes (Signed)
Attempted to call report

## 2016-05-28 NOTE — ED Notes (Signed)
Dr. Mariea Stableron at bedside and request we wait on zosyn until blood culture drawn. Lab contacted for same.

## 2016-05-28 NOTE — H&P (Signed)
Date: 05/28/2016               Patient Name:  Marc Vasquez MRN: [id number]  DOB: 03/22/1946 Age / Sex: 70 y.o., male   PCP: Shelbie AmmonsImran P Haque, MD         Medical Service: Internal Medicine Teaching Service         Attending Physician: Dr. Earl LagosNischal Narendra, MD    First Contact: Dr. Thomasene LotJames Taylor Pager: [phone]  Second Contact: Dr. Hyacinth Meekerasrif Ahmed Pager: [phone]       After Hours (After 5p/  First Contact Pager: [phone]  weekends / holidays): Second Contact Pager: [phone]   Chief Complaint: left knee pain  History of Present Illness: Pt is a 7M w/ DM, tobacco abuse, recent stent placement for nephrolithiasis 3 weeks ago, and TKA in 2014 who presents with left knee pain. Left knee pain began 2 days ago and progressively worsened to the point he was not able to ambulate. He denies fevers, chills, diarrhea, chest pain, abd pain, recent injury, or falls. In the ED Xray of left knee revealed a small joint effusion, the effusion was aspirated revealing yellow, cloudy fluid w/ 89,000 WBCs. Gram stain of fluid was negative for organisms but positive for abundant WBCs. He was started on vanc and zosyn. Dr. Magnus IvanBlackman with orthopedics was consulted and plans to take pt to the OR tomorrow for removal of hardware w/ placement of antibiotic spacers.  In March 2017 pt [facility] for [facility]&D of left knee for prosthetic joint infection, OR cultures grew MSSA. He completed 8 weeks of IV cefazolin on 5/16 and has since be on keflex and rifampin. He reports that 3 weeks ago he was admitted to [facility] for kidney stones with left stent placement.  Meds:  Current Meds  Medication Sig  . acetaminophen (TYLENOL) 325 MG tablet Take 650 mg by mouth every 6 (six) hours as needed (for breakthrough pain).  Marland Kitchen. atorvastatin (LIPITOR) 80 MG tablet Take 80 mg by mouth daily.  . Cholecalciferol (VITAMIN D3) 2000 units TABS Take 2,000 Units by mouth daily.   . clopidogrel (PLAVIX) 75 MG tablet  Take 75 mg by mouth daily.  . furosemide (LASIX) 40 MG tablet Take 80 mg by mouth daily.   Marland Kitchen. HYDROcodone-acetaminophen (NORCO) 10-325 MG tablet Take 1 tablet by mouth every 4 (four) hours as needed for moderate pain.  Marland Kitchen. insulin regular human CONCENTRATED (HUMULIN R) 500 UNIT/ML injection Inject 20-130 Units into the skin See admin instructions. Use 120 to 130 units at breakfast, use 20-30 units at lunch then use 60 to 70 units at dinner  . lisinopril-hydrochlorothiazide (PRINZIDE,ZESTORETIC) 10-12.5 MG tablet Take 1 tablet by mouth daily.  . magnesium oxide (MAG-OX) 400 MG tablet Take 400 mg by mouth 2 (two) times daily.  . Multiple Vitamin (MULTIVITAMIN WITH MINERALS) TABS tablet Take 1 tablet by mouth daily.  Marland Kitchen. omeprazole (PRILOSEC) 40 MG capsule Take 40 mg by mouth daily.  . rifampin (RIFADIN) 300 MG capsule Take 1 capsule (300 mg total) by mouth 2 (two) times daily.  Marland Kitchen. saccharomyces boulardii (FLORASTOR) 250 MG capsule Take 1 capsule (250 mg total) by mouth 2 (two) times daily.  . tamsulosin (FLOMAX) 0.4 MG CAPS capsule Take 0.4 mg by mouth daily.     Allergies: Allergies as of 05/27/2016  . (No Known Allergies)   Past Medical History:  Diagnosis Date  . Depression   . Diabetic neuropathy associated with diabetes mellitus due  to underlying condition (HCC)   . Former smoker quit on 12/15/15  . Generalized OA   . History of left knee replacement   . History of stroke   . Hypercholesteremia   . Infection of prosthetic left knee joint ([facility])   . Left hip pain   . Lumbago   . OSA (obstructive sleep apnea)   . Poorly controlled diabetes mellitus ([facility])     Family History: Denies family hx of heart disease or cancer.   Social History: Pt lives with his wife, he is a retired Barista.   Review of Systems: A complete ROS was negative except as per HPI.   Physical Exam: Blood pressure (!) 126/110, pulse 80, temperature 97.7 F (36.5 C), temperature source Oral, resp. rate 19,  height 5\' 11"  (1.803 m), weight (!) 305 lb (138.3 kg), SpO2 97 %. Physical Exam  Constitutional: He appears well-developed and well-nourished. No distress.  HENT:  Head: Normocephalic and atraumatic.  Cardiovascular: Normal rate, regular rhythm and normal heart sounds.  Exam reveals no gallop and no friction rub.   No murmur heard. Pulmonary/Chest: Effort normal and breath sounds normal. No respiratory distress. He has no wheezes. He has no rales.  Abdominal: Soft. Bowel sounds are normal. He exhibits no distension and no mass. There is no tenderness. There is no rebound and no guarding.  Musculoskeletal:  Left knee with dry dressing from arthrocentesis, limited ROM due to pain  Skin: He is not diaphoretic.     Left knee xray--  Assessment & Plan by Problem: Principal Problem:   Septic arthritis (HCC) Active Problems:   Staph aureus infection   Infection of prosthetic left knee joint (HCC)   Poorly controlled type 2 diabetes mellitus (HCC)   Diabetic neuropathy (HCC)   Essential hypertension  Septic arthritis of left knee-- pt presenting with worsening pain in left knee w/ arthrocentesis revealing cloudy fluid w/ 89,000 WBCs. Gram stain negative for organisms. No signs of sepsis ( nl WBC, HR nl, afebrile). CRP elevated at 2.2. He He follows w/ ID clinic and a TTE was recommended which he never got. - admit to med surg - NPO - ECHO ordered - follow blood cultures and synovial fluid culture - morphine 4mg  q4h prn for pain - received dose of zosyn and vanc in the ED, will continue vanc per pharm - ortho following, appreciate their recommendations. Plans for OR this evening, holding DVT ppx  HTN- normotensive, holding home BP meds DM- follows w/ endocrinology. On concentrated humulin R 500 120 units in the am and 80 units qhs.  - SSI resistent Code-- full   Dispo: Admit patient to Inpatient with expected length of stay greater than 2 midnights.  Signed: Denton Brick,  MD 05/28/2016, 8:08 AM  Pager: [phone]

## 2016-05-28 NOTE — Brief Op Note (Signed)
05/28/2016  7:32 PM  PATIENT:  Marc Vasquez  70 y.o. male  PRE-OPERATIVE DIAGNOSIS:  Left Knee Prosthesis Infection   POST-OPERATIVE DIAGNOSIS:  Left Knee Prosthesis Infection   PROCEDURE:  Procedure(s): REMOVAL OF ALL COMPONENTS OF LEFT TOTAL KNEE AND PLACEMENT OF ANTIBIOTIC SPACER (Left)  SURGEON:  Surgeon(s) and Role:    * Kathryne Hitchhristopher Y Blackman, MD - Primary  PHYSICIAN ASSISTANT: Rexene EdisonGil Clark, PA-C  ANESTHESIA:   general  EBL:  Total I/O In: 700 [I.V.:700] Out: -   COUNTS:  YES  TOURNIQUET:   Total Tourniquet Time Documented: Thigh (Left) - 91 minutes Total: Thigh (Left) - 91 minutes   DICTATION: .Other Dictation: Dictation Number [id number]  PLAN OF CARE: Admit to inpatient   PATIENT DISPOSITION:  PACU - hemodynamically stable.   Delay start of Pharmacological VTE agent (>24hrs) due to surgical blood loss or risk of bleeding: no

## 2016-05-28 NOTE — Anesthesia Postprocedure Evaluation (Signed)
Anesthesia Post Note  Patient: Suszanne ConnersMichael R Zimmermann  Procedure(s) Performed: Procedure(s) (LRB): REMOVAL OF ALL COMPONENTS OF LEFT TOTAL KNEE AND PLACEMENT OF ANTIBIOTIC SPACER (Left)  Patient location during evaluation: PACU Anesthesia Type: General Level of consciousness: awake, awake and alert and oriented Pain management: pain level controlled Vital Signs Assessment: post-procedure vital signs reviewed and stable Respiratory status: spontaneous breathing, nonlabored ventilation and respiratory function stable Cardiovascular status: blood pressure returned to baseline Anesthetic complications: no    Last Vitals:  Vitals:   05/28/16 2030 05/28/16 2045  BP: (!) 144/62 (!) 145/67  Pulse: 88 89  Resp: 20 20  Temp:  36.7 C    Last Pain:  Vitals:   05/28/16 2030  TempSrc:   PainSc: Asleep                 Ameah Chanda COKER

## 2016-05-28 NOTE — ED Triage Notes (Signed)
Per EMS responded to sick call. Left leg pain, warmth, redness, and swelling.  History of knee replacement on the left.  Infection/sepsis with revision surgery in March.  Some nausea/vomiting today.  No fever

## 2016-05-28 NOTE — ED Notes (Signed)
After Morphine admin pt stated  That he was nauseous, Dr Patria Maneampos notified and gave verbal order for 4mg  zofran IV.

## 2016-05-28 NOTE — ED Provider Notes (Signed)
[facility]-EMERGENCY DEPT Provider Note   CSN: [id number] Arrival date & time: 05/28/16  0003   By signing my name below, I, Nelwyn Salisbury, attest that this documentation has been prepared under the direction and in the presence of Azalia Bilis, MD . Electronically Signed: Nelwyn Salisbury, Scribe. 05/28/2016. 1:47 AM.   History   Chief Complaint Chief Complaint  Patient presents with  . Leg Pain    The history is provided by the patient and a relative. No language interpreter was used.    HPI Comments:  Marc Vasquez is a 70 y.o. male with PMHx of DM, Lithotripsy, Kidney Stones and HLD who presents to the Emergency Department complaining of worsening constant left knee pain onset past week. Pt's family reports it has gotten rapidly worse today.  No modifying factors indicated. He reports associated difficulty walking and leg pain. Pt denies any fever, chills, abdominal pain, back pain, recent falls or recent injury. Pt's relative reports he has previously had an emergency knee replacement in March that became infected. Pt's relatives report he is compliant with all oral antibiotics and home medications.   Pt is followed by Dr, Marcha Dutton, Infectious Disease, and Dr. Christell Constant, Orthopedics.  Past Medical History:  Diagnosis Date  . Depression   . 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 Former smoker quit on 12/15/15  . Generalized OA   . History of left knee replacement   . History of stroke   . Hypercholesteremia   . Infection of prosthetic left knee joint (HCC)   . Left hip pain   . Lumbago   . OSA (obstructive sleep apnea)   . Poorly controlled diabetes mellitus [facility])     Patient Active Problem List   Diagnosis Date Noted  . Staph aureus infection 01/07/2016  . Infection of prosthetic left knee joint (HCC) 01/07/2016  . History of arthroplasty of left knee 01/07/2016  . Former smoker 01/07/2016  . Poorly controlled type 2 diabetes  mellitus (HCC) 01/07/2016  . History of CVA (cerebrovascular accident) 01/07/2016  . Diabetic neuropathy (HCC) 01/07/2016  . Essential hypertension 01/07/2016    History reviewed. No pertinent surgical history.   Home Medications    Prior to Admission medications   Medication Sig Start Date End Date Taking? Authorizing Provider  acetaminophen (TYLENOL) 325 MG tablet Take 650 mg by mouth every 6 (six) hours as needed (for breakthrough pain).   Yes Historical Provider, MD  atorvastatin (LIPITOR) 80 MG tablet Take 80 mg by mouth daily.   Yes Historical Provider, MD  Cholecalciferol (VITAMIN D3) 2000 units TABS Take 2,000 Units by mouth daily.    Yes Historical Provider, MD  clopidogrel (PLAVIX) 75 MG tablet Take 75 mg by mouth daily.   Yes Historical Provider, MD  furosemide (LASIX) 40 MG tablet Take 80 mg by mouth daily.    Yes Historical Provider, MD  HYDROcodone-acetaminophen (NORCO) 10-325 MG tablet Take 1 tablet by mouth every 4 (four) hours as needed for moderate pain.   Yes Historical Provider, MD  insulin regular human CONCENTRATED (HUMULIN R) 500 UNIT/ML injection Inject 20-130 Units into the skin See admin instructions. Use 120 to 130 units at breakfast, use 20-30 units at lunch then use 60 to 70 units at dinner   Yes Historical Provider, MD  lisinopril-hydrochlorothiazide (PRINZIDE,ZESTORETIC) 10-12.5 MG tablet Take 1 tablet by mouth daily. 05/23/16  Yes Historical Provider, MD  magnesium oxide (MAG-OX) 400 MG tablet Take 400 mg by mouth  2 (two) times daily.   Yes Historical Provider, MD  Multiple Vitamin (MULTIVITAMIN WITH MINERALS) TABS tablet Take 1 tablet by mouth daily.   Yes Historical Provider, MD  omeprazole (PRILOSEC) 40 MG capsule Take 40 mg by mouth daily.   Yes Historical Provider, MD  rifampin (RIFADIN) 300 MG capsule Take 1 capsule (300 mg total) by mouth 2 (two) times daily. 02/05/16  Yes Judyann Munson, MD  saccharomyces boulardii (FLORASTOR) 250 MG capsule Take 1  capsule (250 mg total) by mouth 2 (two) times daily. 02/05/16  Yes Judyann Munson, MD  tamsulosin (FLOMAX) 0.4 MG CAPS capsule Take 0.4 mg by mouth daily.   Yes Historical Provider, MD  cephALEXin (KEFLEX) 500 MG capsule Take 1 capsule (500 mg total) by mouth 3 (three) times daily. Patient not taking: Reported on 05/28/2016 02/05/16   Judyann Munson, MD  diphenoxylate-atropine (LOMOTIL) 2.5-0.025 MG/5ML liquid Take 5 mLs by mouth 4 (four) times daily as needed for diarrhea or loose stools. Patient not taking: Reported on 05/28/2016 02/05/16   Judyann Munson, MD    Family History Family History  Problem Relation Age of Onset  . Heart disease    . Diabetes      Social History Social History  Substance Use Topics  . Smoking status: Former Smoker    Packs/day: 1.00    Years: 15.00    Types: Cigarettes    Quit date: 12/15/2015  . Smokeless tobacco: Never Used  . Alcohol use 0.6 oz/week    1 Standard drinks or equivalent per week     Comment: rarely     Allergies   Oxycontin [oxycodone hcl]   Review of Systems Review of Systems 10 Systems reviewed and are negative for acute change except as noted in the HPI.   Physical Exam Updated Vital Signs BP 137/62   Pulse 92   Temp 97.7 F (36.5 C) (Oral)   Resp 18   Ht 5\' 11"  (1.803 m)   Wt (!) 305 lb (138.3 kg)   SpO2 96%   BMI 42.54 kg/m   Physical Exam  Constitutional: He is oriented to person, place, and time. He appears well-developed and well-nourished.  HENT:  Head: Normocephalic and atraumatic.  Eyes: EOM are normal.  Neck: Normal range of motion.  Cardiovascular: Normal rate, regular rhythm, normal heart sounds and intact distal pulses.   Pulmonary/Chest: Effort normal and breath sounds normal. No respiratory distress.  Abdominal: Soft. He exhibits no distension. There is no tenderness.  Musculoskeletal: Normal range of motion.  Normal pulses left foot. Full ROM of left hip and left ankle. Limited ROM of left knee  secondary to pain. No significant erythema of left knee. Mild warmth of left knee. Possible small left knee joint effusion.   Neurological: He is alert and oriented to person, place, and time.  Skin: Skin is warm and dry.  Psychiatric: He has a normal mood and affect. Judgment normal.  Nursing note and vitals reviewed.    ED Treatments / Results  DIAGNOSTIC STUDIES:  Oxygen Saturation is 94% on RA, adequate by my interpretation.    COORDINATION OF CARE:  1:48 AM Discussed treatment plan with pt at bedside which included imaging and pain medication and pt agreed to plan.  Labs (all labs ordered are listed, but only abnormal results are displayed) Labs Reviewed  CBC WITH DIFFERENTIAL/PLATELET - Abnormal; Notable for the following:       Result Value   RBC 3.99 (*)    Hemoglobin 11.7 (*)  HCT 36.2 (*)    Monocytes Absolute 1.1 (*)    All other components within normal limits  SEDIMENTATION RATE - Abnormal; Notable for the following:    Sed Rate 50 (*)    All other components within normal limits  C-REACTIVE PROTEIN - Abnormal; Notable for the following:    CRP 2.2 (*)    All other components within normal limits  SYNOVIAL CELL COUNT + DIFF, W/ CRYSTALS - Abnormal; Notable for the following:    Color, Synovial YELLOW (*)    Appearance-Synovial CLOUDY (*)    WBC, Synovial 89,000 (*)    Neutrophil, Synovial 86 (*)    Monocyte-Macrophage-Synovial Fluid 13 (*)    All other components within normal limits  GRAM STAIN  CULTURE, BODY FLUID-BOTTLE  BASIC METABOLIC PANEL  MISC [facility] TEST (SEND OUT)  MISC [facility] TEST (SEND OUT)    EKG  EKG Interpretation None       Radiology Dg Knee Complete 4 Views Left  Result Date: 05/28/2016 CLINICAL DATA:  70 y/o M; pain to anterior knee. History of knee sepsis with knee replacement. EXAM: LEFT KNEE - COMPLETE 4+ VIEW COMPARISON:  Knee radiographs 12/15/2015. FINDINGS: No acute fracture or dislocation is identified. Total knee  replacement and patellar resurfacing with prosthesis. There has been development of periprosthetic lucency at the medial and lateral margins of the tibial plateau component. Small joint effusion. IMPRESSION: 1. No acute fracture or dislocation.  Intact hardware. 2. Small joint effusion. 3. Interval development of mild periprosthetic lucency in medial lateral margins of the tibial plateau component may represent loosening. Electronically Signed   By: Mitzi HansenLance  Furusawa-Stratton M.D.   On: 05/28/2016 02:20    Procedures .Joint Aspiration/Arthrocentesis Performed by: Azalia BilisAMPOS, Amali Uhls Authorized by: Azalia BilisAMPOS, Aryaman Haliburton   Consent:    Consent obtained:  Verbal   Consent given by:  Patient   Risks discussed:  Infection Location:    Location:  Knee   Knee:  L knee Anesthesia (see MAR for exact dosages):    Anesthesia method:  Local infiltration   Local anesthetic:  Lidocaine 2% WITH epi Procedure details:    Preparation: Patient was prepped and draped in usual sterile fashion     Needle gauge:  18 G   Ultrasound guidance: no     Approach:  Lateral   Aspirate amount:  60cc   Aspirate characteristics:  Cloudy and purulent Post-procedure details:    Dressing:  Adhesive bandage   Patient tolerance of procedure:  Tolerated well, no immediate complications   (including critical care time)  Medications Ordered in ED Medications  vancomycin (VANCOCIN) 1,250 mg in sodium chloride 0.9 % 250 mL IVPB (not administered)  piperacillin-tazobactam (ZOSYN) IVPB 3.375 g (not administered)  morphine 4 MG/ML injection 6 mg (6 mg Intravenous Given 05/28/16 0219)  ondansetron (ZOFRAN) injection 4 mg (4 mg Intravenous Given 05/28/16 0231)  lidocaine-EPINEPHrine (XYLOCAINE W/EPI) 2 %-1:100000 (with pres) injection 30 mL (30 mLs Infiltration Given by Other 05/28/16 0341)     Initial Impression / Assessment and Plan / ED Course  I have reviewed the triage vital signs and the nursing notes.  Pertinent labs & imaging  results that were available during my care of the patient were reviewed by me and considered in my medical decision making (see chart for details).  Clinical Course    Left knee concerning for septic arthritis given white blood cell count of 89,000 and clinical signs to suggest septic arthritis of the left knee.  Patient be started  on vancomycin and Zosyn given the fact that this is occurring through Keflex and rifampin.  He follows with her infectious disease  6:50 AM I spoke with Dr. Magnus Ivan, orthopedics, who agrees with antibiotics at this time and will see the patient in consultation for possible operative management.  Agrees with infectious disease consultation as well.  Final Clinical Impressions(s) / ED Diagnoses   Final diagnoses:  Pyogenic arthritis of left knee joint, due to unspecified organism [facility])    New Prescriptions New Prescriptions   No medications on file   I personally performed the services described in this documentation, which was scribed in my presence. The recorded information has been reviewed and is accurate.        Azalia Bilis, MD 05/28/16 [phone]

## 2016-05-28 NOTE — Progress Notes (Signed)
Patient ID: Marc SprinklesMichael R Vasquez, male   DOB: 02/04/1946, 70 y.o.   MRN: [id number] Marc Vasquez've been informed about Marc Vasquez and the history behind his knee.  He will need surgery on this knee, but most likely tomorrow.  This will likely need to be removal of all the knee components at this standpoint and placement of an antibiotic spacer.  [facility] and will come and see Marc Vasquez late this afternoon.  He can eat breakfast from my standpoint, and then should be NPO after that for the possibility I can get Marc Vasquez to surgery this evening.

## 2016-05-29 DIAGNOSIS — E1165 Type 2 diabetes mellitus with hyperglycemia: Secondary | ICD-10-CM

## 2016-05-29 DIAGNOSIS — Y792 Prosthetic and other implants, materials and accessory orthopedic devices associated with adverse incidents: Secondary | ICD-10-CM

## 2016-05-29 DIAGNOSIS — B9689 Other specified bacterial agents as the cause of diseases classified elsewhere: Secondary | ICD-10-CM

## 2016-05-29 DIAGNOSIS — R197 Diarrhea, unspecified: Secondary | ICD-10-CM

## 2016-05-29 DIAGNOSIS — N179 Acute kidney failure, unspecified: Secondary | ICD-10-CM

## 2016-05-29 DIAGNOSIS — R112 Nausea with vomiting, unspecified: Secondary | ICD-10-CM

## 2016-05-29 DIAGNOSIS — Z794 Long term (current) use of insulin: Secondary | ICD-10-CM

## 2016-05-29 DIAGNOSIS — M00862 Arthritis due to other bacteria, left knee: Secondary | ICD-10-CM

## 2016-05-29 DIAGNOSIS — T8454XD Infection and inflammatory reaction due to internal left knee prosthesis, subsequent encounter: Secondary | ICD-10-CM

## 2016-05-29 DIAGNOSIS — E1140 Type 2 diabetes mellitus with diabetic neuropathy, unspecified: Secondary | ICD-10-CM

## 2016-05-29 LAB — MISC LABCORP TEST (SEND OUT)
LABCORP TEST CODE: 19497
LABCORP TEST CODE: 19588

## 2016-05-29 LAB — BASIC METABOLIC PANEL
Anion gap: 12 (ref 5–15)
BUN: 23 mg/dL — ABNORMAL HIGH (ref 6–20)
CHLORIDE: 102 mmol/L (ref 101–111)
CO2: 21 mmol/L — AB (ref 22–32)
CREATININE: 1.63 mg/dL — AB (ref 0.61–1.24)
Calcium: 8.2 mg/dL — ABNORMAL LOW (ref 8.9–10.3)
GFR calc non Af Amer: 41 mL/min — ABNORMAL LOW (ref >60)
GFR, EST AFRICAN AMERICAN: 48 mL/min — AB (ref >60)
Glucose, Bld: 353 mg/dL — ABNORMAL HIGH (ref 65–99)
POTASSIUM: 4.9 mmol/L (ref 3.5–5.1)
Sodium: 135 mmol/L (ref 135–145)

## 2016-05-29 LAB — C-REACTIVE PROTEIN: CRP: 25.6 mg/dL — ABNORMAL HIGH (ref <1.0)

## 2016-05-29 LAB — SEDIMENTATION RATE: Sed Rate: 100 mm/hr — ABNORMAL HIGH (ref 0–16)

## 2016-05-29 LAB — GLUCOSE, CAPILLARY
Glucose-Capillary: 341 mg/dL — ABNORMAL HIGH (ref 65–99)
Glucose-Capillary: 331 mg/dL — ABNORMAL HIGH (ref 65–99)
Glucose-Capillary: 265 mg/dL — ABNORMAL HIGH (ref 65–99)
Glucose-Capillary: 308 mg/dL — ABNORMAL HIGH (ref 65–99)

## 2016-05-29 MED ORDER — PANTOPRAZOLE SODIUM 40 MG PO TBEC
40.0000 mg | DELAYED_RELEASE_TABLET | Freq: Every day | ORAL | Status: DC
  Administered 2016-05-29 – 2016-06-01 (×4): 40 mg via ORAL
  Filled 2016-05-29 (×4): qty 1

## 2016-05-29 MED ORDER — TAMSULOSIN HCL 0.4 MG PO CAPS
0.4000 mg | ORAL_CAPSULE | Freq: Every day | ORAL | Status: DC
  Administered 2016-05-29 – 2016-06-01 (×4): 0.4 mg via ORAL
  Filled 2016-05-29 (×4): qty 1

## 2016-05-29 MED ORDER — LISINOPRIL-HYDROCHLOROTHIAZIDE 10-12.5 MG PO TABS: 1.0000 | ORAL_TABLET | Freq: Every day | ORAL | Status: DC

## 2016-05-29 MED ORDER — INSULIN GLARGINE 100 UNIT/ML ~~LOC~~ SOLN
60.0000 [IU] | Freq: Every day | SUBCUTANEOUS | Status: DC
  Administered 2016-05-29: 60 [IU] via SUBCUTANEOUS
  Filled 2016-05-29 (×2): qty 0.6

## 2016-05-29 NOTE — Progress Notes (Signed)
PT Cancellation Note  Patient Details Name: Marisa SprinklesMichael R Maita MRN: [id number] DOB: 05/31/1946   Cancelled Treatment:    Reason Eval/Treat Not Completed: Patient declined, no reason specified. Pt having episode of emesis when PT entered room. PT will continue to f/u with pt as appropriate.   Alessandra BevelsJennifer M Marshaun Lortie 05/29/2016, 11:26 AM Deborah ChalkJennifer Skylen Danielsen, PT, DPT [phone]

## 2016-05-29 NOTE — Consult Note (Addendum)
[place] for Infectious Disease  Date of Admission:  05/28/2016  Date of Consult:  05/29/2016  Reason for Consult:Prosthetic Joint Infection (osteomyelitis) Referring Physician: Dareen Piano  Impression/Recommendation Prosthetic Joint Infection DM2 with neuropathy Diarrhea  Would continue high dose ancef Place PIC Await Cx (but would assume this is the same staph as prev) Recheck ESR and CRP Check A1C Check C diff if diarrhea continues (prev -) Consider plain films to eval for post-op ileus if n/v persists  Thank you so much for this interesting consult,   Bobby Rumpf (pager) [phone] [email or web address]  Marc Vasquez is an 70 y.o. male.  HPI: 70 yo M with hx of Dm2 (> 30 yrs, on 120u in AM and 80u PM at home) with neuropathy. OA and L TKR 2014. He did well until March 2017 when he was found to have prosthetic joint infection. He underwent exchange of parts, his Cx grew MSSA. He was d/c on ancef, with rifampin added as an outpt. He completed 8 weeks of IV anbx (completed 5-16) and was then changed to keflex and rifampin. He had ID clinic f/u on 02-05-16.  His f/u was further complicated by diabetic control and loose BM.  He returns to hospital on 8-25 with worsening pain and swelling in his L knee. He was taken to OR on 8-26 and underwent complete resection of his TKR with placement of anbx spacer.  Cx is incubating.   Past Medical History:  Diagnosis Date  . Depression   . 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 Former smoker quit on 12/15/15  . Generalized OA   . History of left knee replacement   . History of stroke   . Hypercholesteremia   . Infection of prosthetic left knee joint ([place])   . Left hip pain   . Lumbago   . OSA (obstructive sleep apnea)   . Poorly controlled diabetes mellitus ([place])     History reviewed. No pertinent surgical history.   Allergies  Allergen Reactions  . Oxycontin  [Oxycodone Hcl] Itching    Medications:  Scheduled: . sodium chloride   Intravenous Once  . bupivacaine liposome  20 mL Infiltration To NeurOR  .  ceFAZolin (ANCEF) IV  2 g Intravenous Q8H  . clopidogrel  75 mg Oral Daily  . insulin aspart  0-20 Units Subcutaneous TID WC  . insulin glargine  60 Units Subcutaneous Daily  . pantoprazole  40 mg Oral Daily  . tamsulosin  0.4 mg Oral Daily    Abtx:  Anti-infectives    Start     Dose/Rate Route Frequency Ordered Stop   05/28/16 2200  ceFAZolin (ANCEF) IVPB 2g/100 mL premix     2 g 200 mL/hr over 30 Minutes Intravenous Every 8 hours 05/28/16 1956     05/28/16 2200  ceFAZolin (ANCEF) IVPB 2g/100 mL premix  Status:  Discontinued     2 g 200 mL/hr over 30 Minutes Intravenous Every 8 hours 05/28/16 1957 05/28/16 2122   05/28/16 2000  vancomycin (VANCOCIN) 1,250 mg in sodium chloride 0.9 % 250 mL IVPB  Status:  Discontinued     1,250 mg 166.7 mL/hr over 90 Minutes Intravenous Every 12 hours 05/28/16 1104 05/28/16 1956   05/28/16 1748  vancomycin (VANCOCIN) powder  Status:  Discontinued       As needed 05/28/16 1748 05/28/16 1941   05/28/16 1748  tobramycin (NEBCIN) powder  Status:  Discontinued  As needed 05/28/16 1749 05/28/16 1941   05/28/16 0715  vancomycin (VANCOCIN) 2,000 mg in sodium chloride 0.9 % 500 mL IVPB     2,000 mg 250 mL/hr over 120 Minutes Intravenous  Once 05/28/16 0659 05/28/16 1115   05/28/16 0700  vancomycin (VANCOCIN) 1,250 mg in sodium chloride 0.9 % 250 mL IVPB  Status:  Discontinued     1,250 mg 166.7 mL/hr over 90 Minutes Intravenous  Once 05/28/16 0648 05/28/16 0658   05/28/16 0700  piperacillin-tazobactam (ZOSYN) IVPB 3.375 g     3.375 g 100 mL/hr over 30 Minutes Intravenous  Once 05/28/16 0648 05/28/16 0858      Total days of antibiotics: 1 ancef          Social History:  reports that he quit smoking about 5 months ago. His smoking use included Cigarettes. He has a 15.00 pack-year smoking history. He  has never used smokeless tobacco. He reports that he drinks about 0.6 oz of alcohol per week . He reports that he does not use drugs.  Family History  Problem Relation Age of Onset  . Heart disease    . Diabetes        8-25  5-4  4-5 C-reactive protein 2.2  1.1  3.9 ESR   50  85  122   General ROS: no fever at home. + chills. knee was hot. continued diarrhea. +neuropathy. eatign well at home.   FSG 109-341. +N/V (stool like per wife). Afebrile in hospital.  Had eye exam this year.   Blood pressure (!) 154/60, pulse (!) 103, temperature 99.3 F (37.4 C), temperature source Oral, resp. rate 20, height '5\' 11"'  (1.803 m), weight (!) 138.3 kg (305 lb), SpO2 100 %. General appearance: alert, cooperative and no distress Eyes: negative findings: conjunctivae and sclerae normal and pupils equal, round, reactive to light and accomodation Throat: lips, mucosa, and tongue normal; teeth and gums normal Neck: no adenopathy and supple, symmetrical, trachea midline Lungs: clear to auscultation bilaterally Heart: regular rate and rhythm Abdomen: normal findings: soft, non-tender and abnormal findings:  distended and hypoactive bowel sounds Extremities: edema non-pitting and LLE wrapped, not undressed.    Results for orders placed or performed during the hospital encounter of 05/28/16 (from the past 48 hour(s))  CBC with Differential/Platelet     Status: Abnormal   Collection Time: 05/28/16  2:19 AM  Result Value Ref Range   WBC 9.8 4.0 - 10.5 K/uL   RBC 3.99 (L) 4.22 - 5.81 MIL/uL   Hemoglobin 11.7 (L) 13.0 - 17.0 g/dL   HCT 36.2 (L) 39.0 - 52.0 %   MCV 90.7 78.0 - 100.0 fL   MCH 29.3 26.0 - 34.0 pg   MCHC 32.3 30.0 - 36.0 g/dL   RDW 13.7 11.5 - 15.5 %   Platelets 176 150 - 400 K/uL   Neutrophils Relative % 77 %   Neutro Abs 7.6 1.7 - 7.7 K/uL   Lymphocytes Relative 12 %   Lymphs Abs 1.2 0.7 - 4.0 K/uL   Monocytes Relative 11 %   Monocytes Absolute 1.1 (H) 0.1 - 1.0 K/uL   Eosinophils  Relative 0 %   Eosinophils Absolute 0.0 0.0 - 0.7 K/uL   Basophils Relative 0 %   Basophils Absolute 0.0 0.0 - 0.1 K/uL  Basic metabolic panel     Status: None   Collection Time: 05/28/16  2:19 AM  Result Value Ref Range   Sodium 138 135 - 145 mmol/L   Potassium  4.1 3.5 - 5.1 mmol/L   Chloride 107 101 - 111 mmol/L   CO2 24 22 - 32 mmol/L   Glucose, Bld 71 65 - 99 mg/dL   BUN 16 6 - 20 mg/dL   Creatinine, Ser 1.18 0.61 - 1.24 mg/dL   Calcium 8.9 8.9 - 10.3 mg/dL   GFR calc non Af Amer >60 >60 mL/min   GFR calc Af Amer >60 >60 mL/min    Comment: (NOTE) The eGFR has been calculated using the CKD EPI equation. This calculation has not been validated in all clinical situations. eGFR's persistently <60 mL/min signify possible Chronic Kidney Disease.    Anion gap 7 5 - 15  Sedimentation rate     Status: Abnormal   Collection Time: 05/28/16  2:19 AM  Result Value Ref Range   Sed Rate 50 (H) 0 - 16 mm/hr  C-reactive protein     Status: Abnormal   Collection Time: 05/28/16  2:19 AM  Result Value Ref Range   CRP 2.2 (H) <1.0 mg/dL  Synovial cell count + diff, w/ crystals     Status: Abnormal   Collection Time: 05/28/16  4:20 AM  Result Value Ref Range   Color, Synovial YELLOW (A) YELLOW   Appearance-Synovial CLOUDY (A) CLEAR   Crystals, Fluid NO CRYSTALS SEEN    WBC, Synovial 89,000 (H) 0 - 200 /cu mm   Neutrophil, Synovial 86 (H) 0 - 25 %   Lymphocytes-Synovial Fld 1 0 - 20 %   Monocyte-Macrophage-Synovial Fluid 13 (L) 50 - 90 %   Eosinophils-Synovial 0 0 - 1 %  Miscellaneous [facility] test (send-out)     Status: None   Collection Time: 05/28/16  4:20 AM  Result Value Ref Range   [facility] test code [id number]    [facility] test name TOTAL PROTEIN    Source ([facility]) FLUID    Misc [facility] result COMMENT     Comment: (NOTE) Test Ordered: [id number] Protein, Body Fluid Protein, Body Fluid            3.3              g/dL     BN   ________________________________________________________ :   Peritoneal  :       Pleural          :   Synovial     : :______________:________________________:________________: :              : Transudate :  Exudate  :                : :______________:____________:___________:________________: :  Not Estab.  :   <3 g/dL  :  >3 g/dL  :    <2.5 g/dL   : :______________:____________:___________:________________: The method performance specifications have not been established for this test in body fluid. The test result should be integrated into the clinical context for interpretation. The method performance specifications have not been established for this test in body fluid.  The test result should be integrated into the clinical context for interpretation. Performed At: [facility] [place], [place] [id number] Lindon Romp MD [id number]   Miscellaneous [facility] test (send-out)     Status: None   Collection Time: 05/28/16  4:20 AM  Result Value Ref Range   [facility] test code [id number]    [facility] test name GLUCOSE    Source ([facility]) FLUID    Misc [facility] result COMMENT     Comment: (NOTE) Test Ordered: [phone]  Glucose, Body Fluid Glucose, Body Fluid            30               mg/dL    BN   ________________________________________________________ :  Peritoneal  :       Pleural          :   Synovial     : :______________:________________________:________________: :              : Transudate :  Exudate  :                : :______________:____________:___________:________________: :  Not Estab.  :  Equal to simultaneously drawn plasma   : :______________:_________________________________________: The method performance specifications have not been established for this test in body fluid. The test result should be integrated into clinical context for interpretation. The reference intervals and other method performance specifications have not been established for this test. The test result should be integrated into the  clinical context for interpretation. Performed At: [facility] [address] [place], [place] [id number] Inocente Salles [id number]   Gram stain     Status: None   Collection Time: 05/28/16  4:25 AM  Result Value Ref Range   Specimen Description SYNOVIAL LEFT KNEE    Special Requests NONE    Gram Stain      ABUNDANT WBC PRESENT, PREDOMINANTLY PMN NO ORGANISMS SEEN    Report Status 05/28/2016 FINAL   Culture, body fluid-bottle     Status: None (Preliminary result)   Collection Time: 05/28/16  4:25 AM  Result Value Ref Range   Specimen Description SYNOVIAL LEFT KNEE    Special Requests BOTTLES DRAWN AEROBIC AND ANAEROBIC 10CC    Culture CULTURE REINCUBATED FOR BETTER GROWTH    Report Status PENDING   Type and screen [place]     Status: None   Collection Time: 05/28/16  8:15 AM  Result Value Ref Range   ABO/RH(D) O POS    Antibody Screen NEG    Sample Expiration 05/31/2016   ABO/Rh     Status: None   Collection Time: 05/28/16  8:15 AM  Result Value Ref Range   ABO/RH(D) O POS   Glucose, capillary     Status: Abnormal   Collection Time: 05/28/16 11:57 AM  Result Value Ref Range   Glucose-Capillary 109 (H) 65 - 99 mg/dL  Surgical pcr screen     Status: None   Collection Time: 05/28/16  1:34 PM  Result Value Ref Range   MRSA, PCR NEGATIVE NEGATIVE   Staphylococcus aureus NEGATIVE NEGATIVE    Comment:        The Xpert SA Assay (FDA approved for NASAL specimens in patients over 31 years of age), is one component of a comprehensive surveillance program.  Test performance has been validated by [facility] for patients greater than or equal to 59 year old. It is not intended to diagnose infection nor to guide or monitor treatment.   Glucose, capillary     Status: Abnormal   Collection Time: 05/28/16  3:42 PM  Result Value Ref Range   Glucose-Capillary 135 (H) 65 - 99 mg/dL   Comment 1 Repeat Test    Comment 2 Document in Chart     Glucose, capillary     Status: Abnormal   Collection Time: 05/28/16  4:56 PM  Result Value Ref Range   Glucose-Capillary 154 (H) 65 - 99 mg/dL  Glucose, capillary     Status: Abnormal   Collection Time: 05/28/16  7:50 PM  Result Value Ref Range   Glucose-Capillary 200 (H) 65 - 99 mg/dL  Glucose, capillary     Status: Abnormal   Collection Time: 05/28/16 10:17 PM  Result Value Ref Range   Glucose-Capillary 254 (H) 65 - 99 mg/dL  Glucose, capillary     Status: Abnormal   Collection Time: 05/29/16  6:29 AM  Result Value Ref Range   Glucose-Capillary 341 (H) 65 - 99 mg/dL  Basic metabolic panel     Status: Abnormal   Collection Time: 05/29/16  6:32 AM  Result Value Ref Range   Sodium 135 135 - 145 mmol/L   Potassium 4.9 3.5 - 5.1 mmol/L   Chloride 102 101 - 111 mmol/L   CO2 21 (L) 22 - 32 mmol/L   Glucose, Bld 353 (H) 65 - 99 mg/dL   BUN 23 (H) 6 - 20 mg/dL   Creatinine, Ser 1.63 (H) 0.61 - 1.24 mg/dL   Calcium 8.2 (L) 8.9 - 10.3 mg/dL   GFR calc non Af Amer 41 (L) >60 mL/min   GFR calc Af Amer 48 (L) >60 mL/min    Comment: (NOTE) The eGFR has been calculated using the CKD EPI equation. This calculation has not been validated in all clinical situations. eGFR's persistently <60 mL/min signify possible Chronic Kidney Disease.    Anion gap 12 5 - 15  Glucose, capillary     Status: Abnormal   Collection Time: 05/29/16 10:57 AM  Result Value Ref Range   Glucose-Capillary 331 (H) 65 - 99 mg/dL      Component Value Date/Time   SDES SYNOVIAL LEFT KNEE 05/28/2016 0425   SDES SYNOVIAL LEFT KNEE 05/28/2016 0425   SPECREQUEST NONE 05/28/2016 0425   SPECREQUEST BOTTLES DRAWN AEROBIC AND ANAEROBIC 10CC 05/28/2016 0425   CULT CULTURE REINCUBATED FOR BETTER GROWTH 05/28/2016 0425   REPTSTATUS 05/28/2016 FINAL 05/28/2016 0425   REPTSTATUS PENDING 05/28/2016 0425   Dg Chest Portable 1 View  Result Date: 05/28/2016 CLINICAL DATA:  Preoperative for left knee surgery.  Diabetes. EXAM:  PORTABLE CHEST 1 VIEW COMPARISON:  Multiple exams, including 12/14/2015 FINDINGS: Slight upper zone pulmonary vascular prominence but no cardiomegaly. No edema or discrete airspace opacity. No pleural effusion identified. IMPRESSION: No active cardiopulmonary disease is radiographically apparent. Electronically Signed   By: Van Clines M.D.   On: 05/28/2016 09:32   Dg Knee Complete 4 Views Left  Result Date: 05/28/2016 CLINICAL DATA:  70 y/o M; pain to anterior knee. History of knee sepsis with knee replacement. EXAM: LEFT KNEE - COMPLETE 4+ VIEW COMPARISON:  Knee radiographs 12/15/2015. FINDINGS: No acute fracture or dislocation is identified. Total knee replacement and patellar resurfacing with prosthesis. There has been development of periprosthetic lucency at the medial and lateral margins of the tibial plateau component. Small joint effusion. IMPRESSION: 1. No acute fracture or dislocation.  Intact hardware. 2. Small joint effusion. 3. Interval development of mild periprosthetic lucency in medial lateral margins of the tibial plateau component may represent loosening. Electronically Signed   By: Kristine Garbe M.D.   On: 05/28/2016 02:20   Dg Knee Left Port  Result Date: 05/28/2016 CLINICAL DATA:  Removal of knee joint prosthetic components. EXAM: PORTABLE LEFT KNEE - 1-2 VIEW COMPARISON:  None. FINDINGS: Postoperative images demonstrate removal of the metal prosthetic components. Radio-opaque antibiotic impregnated spacers have been placed over the distal femur and proximal tibia, well positioned. IMPRESSION: Status  post removal of infected knee prosthesis components with placement of antibiotic impregnated spacers. Electronically Signed   By: Lajean Manes M.D.   On: 05/28/2016 20:30   Recent Results (from the past 240 hour(s))  Gram stain     Status: None   Collection Time: 05/28/16  4:25 AM  Result Value Ref Range Status   Specimen Description SYNOVIAL LEFT KNEE  Final    Special Requests NONE  Final   Gram Stain   Final    ABUNDANT WBC PRESENT, PREDOMINANTLY PMN NO ORGANISMS SEEN    Report Status 05/28/2016 FINAL  Final  Culture, body fluid-bottle     Status: None (Preliminary result)   Collection Time: 05/28/16  4:25 AM  Result Value Ref Range Status   Specimen Description SYNOVIAL LEFT KNEE  Final   Special Requests BOTTLES DRAWN AEROBIC AND ANAEROBIC 10CC  Final   Culture CULTURE REINCUBATED FOR BETTER GROWTH  Final   Report Status PENDING  Incomplete  Surgical pcr screen     Status: None   Collection Time: 05/28/16  1:34 PM  Result Value Ref Range Status   MRSA, PCR NEGATIVE NEGATIVE Final   Staphylococcus aureus NEGATIVE NEGATIVE Final    Comment:        The Xpert SA Assay (FDA approved for NASAL specimens in patients over 79 years of age), is one component of a comprehensive surveillance program.  Test performance has been validated by [facility] for patients greater than or equal to 75 year old. It is not intended to diagnose infection nor to guide or monitor treatment.       05/29/2016, 12:55 PM     LOS: 1 day    Records and images were personally reviewed where available.

## 2016-05-29 NOTE — Progress Notes (Signed)
OT Cancellation Note  Patient Details Name: Marc SprinklesMichael R Sarwar MRN: [id number] DOB: 10/09/1945   Cancelled Treatment:    Reason Eval/Treat Not Completed: Medical issues which prohibited therapy;Other (comment) (n/v) Per PT cancellation note, pt experiencing n/v and is not currently appropriate for evaluation. Will check back tomorrow.   Pilar GrammesMathews, Mylee Falin H 05/29/2016, 2:48 PM

## 2016-05-29 NOTE — Progress Notes (Signed)
Obtained consent for PICC line placement, but pt requests to wait until Sunday due to N/V this AM.  States still feels bad. Wife at bedside agrees.  Pt not for d/c until possibly Monday.  Synetta FailAnita RN notified.

## 2016-05-29 NOTE — Op Note (Signed)
[name], Vasquez                 ACCOUNT NO.:  [id number]  MEDICAL RECORD NO.:  [id number]  LOCATION:  5N12C                        FACILITY:  [facility]  PHYSICIAN:  Marc Vasquez. Marc Vasquez, M.D.DATE OF BIRTH:  01/02/1946  DATE OF PROCEDURE:  05/28/2016 DATE OF DISCHARGE:                              OPERATIVE REPORT   PREOPERATIVE DIAGNOSIS:  Chronically infected left total knee arthroplasty.  POSTOPERATIVE DIAGNOSIS:  Chronically infected left total knee arthroplasty.  PROCEDURE: 1. Excision arthroplasty of all components of a left total knee joint. 2. Placement of antibiotic spacer with articulating cement spacer,     left knee.  SURGEON:  Marc Vasquez. Marc Vasquez, M.D.  ASSISTANT:  Marc Canal, PA-C.  ANESTHESIA:  General.  ANTIBIOTICS:  2 g of IV Ancef.  TOURNIQUET TIME:  90 minutes.  BLOOD LOSS:  150 to 200 mL.  COMPLICATIONS:  None.  INDICATIONS:  Marc Vasquez is a pleasant 70 year old, morbidly obese individual who presented to the [facility] Emergency Room late last night or early this morning with a swollen painful left knee.  The aspiration of his knee by the ER staff showed cloudy fluid, no organisms, but 89,000 white blood cells.  He has a history of MSSA infection.  His original knee was replaced in [place] in 2014.  Earlier this year, he developed an infection of that left knee and he was taken to the operating room at [facility] where an irrigation, debridement, synovectomy, and poly liner exchange was performed.  He was then on 6 weeks of IV antibiotics and then has been on a combination of cephalexin orally and rifampin.  Over the last 48 hours, he developed worsening pain and swelling of his left knee when he went to the Emergency Room at [facility] because he is followed by Dr. Judyann Vasquez, Infectious Disease specialist here.  In the emergency room again he was noted to have purulent material that they aspirated from his  knee with a high white blood cell count.  Given those findings, this is definitely a chronic infected knee.  On x-rays, the tibial component does show obvious loosening.  At this point, he needs an excision of all components of the knee and placement of antibiotic spacer as well as a synovectomy to hopefully eradicate the infection from his knee and sterilize the knee for a ventral revision at a later date once he can clear the infection.  I talked to him and his wife in detail about the surgery including the risks and benefits, and they understand the need to proceed.  PROCEDURE DESCRIPTION:  After informed consent was obtained, appropriate left knee was marked.  He was brought to the operating room, placed supine on the operating table.  General anesthesia was then obtained.  A nonsterile tourniquet was placed around his upper left thigh and his left thigh, leg, knee, ankle, and foot were prepped and draped with DuraPrep and sterile drapes using a sterile stockinette.  With the bed raised, a time-out was called.  He was identified as correct patient, correct left knee.  We then had the tourniquet inflated to 300 mm of pressure.  We made a direct midline incision  over his previous incision, dissected down to the knee joint.  We performed a medial parapatellar arthrotomy and found a very large purulent effusion in the knee.  Once we evacuated this, we were able to remove all components from the knee as well as all cement debris.  We found the tibial component definitely to be loose.  We then performed extensive synovectomy and debridement of the entire knee joint and capsule.  After that, we were able to irrigate 3 L of normal saline solution at the knee using pulsatile lavage.  We then mixed cement for articulating cement spacer using methylmethacrylate bone cement mixed with vancomycin and tobramycin.  We were able to make a mold for the femur and then an articulating component for the  tibia.  Once we got the cement antibiotic spacers in place and then the cement hardened.  We irrigated the knee with an additional 3 L of normal saline solution for a total of 9 L of fluid through the knee.  We let the tourniquet down.  Hemostasis was obtained with electrocautery.  We closed the arthrotomy with interrupted #1 Vicryl suture followed by 0-Vicryl in the deep tissue, 2-0 Vicryl in the subcutaneous tissue, interrupted staples on the skin.  Xeroform and well- padded sterile dressing was applied.  His knee was placed in a knee immobilizer.  He was awakened, extubated, and taken to the recovery room in a stable condition.  All final counts were correct.  There were no complications noted.  Postoperatively, he will need to be on at least 6 weeks minimal of IV antibiotics with PICC line and close followup for considering a revision arthroplasty once all his infection parameters have resolved.  Of note, Marc CanalGilbert Clark, PA-C assisted in the entire case.  His assistance was crucial for facilitating all aspects of his case.     Marc Pandahristopher Y. Marc Vasquez, M.D.     CYB/MEDQ  D:  05/28/2016  T:  05/29/2016  Job:  [id number]

## 2016-05-29 NOTE — Progress Notes (Signed)
PT Cancellation Note  Patient Details Name: Marc SprinklesMichael R Oregel MRN: [id number] DOB: 06/20/1946   Cancelled Treatment:    Reason Eval/Treat Not Completed: Patient not medically ready. PT attempted evaluation visit with pt for the second time today; however, pt's ID physician sitting outside of his room stated that he continues to have n/v and is not in a good state at the moment. Will hold off on PT evaluation for now. PT will continue to f/u with pt as appropriate.   Alessandra BevelsJennifer M Arlet Marter 05/29/2016, 1:58 PM Deborah ChalkJennifer Ibraheem Voris, PT, DPT [phone]

## 2016-05-29 NOTE — Progress Notes (Signed)
   Subjective: No acute events overnight. Patient denies nausea, vomiting or abdominal pain. He does endorse pain of his left knee following his orthopedic procedure. He is postop day 1 and is afebrile and hemodynamically stable. He seems to be recovering well following his procedure. He has no additional acute complaints or concerns this morning.  Objective:  Vital signs in last 24 hours: Vitals:   05/28/16 2029 05/28/16 2030 05/28/16 2045 05/29/16 0541  BP:  (!) 144/62 (!) 145/67 (!) 154/60  Pulse: 83 88 89 (!) 103  Resp: 19 20 20    Temp:   98.1 F (36.7 C) 99.3 F (37.4 C)  TempSrc:    Oral  SpO2: 93% 96% 95% 100%  Weight:      Height:       Physical Exam  Constitutional: He is oriented to person, place, and time. He appears well-developed and well-nourished.  In no acute distress, recovering comfortably following his procedure, obese  HENT:  Head: Normocephalic and atraumatic.  Cardiovascular: Normal rate and regular rhythm.  Exam reveals no gallop and no friction rub.   No murmur heard. Respiratory: Effort normal and breath sounds normal. No respiratory distress. He has no wheezes.  GI: Soft. Bowel sounds are normal. He exhibits no distension. There is no tenderness.  No abdominal or renal bruits auscultated although this exam was limited secondary to obesity  Musculoskeletal: He exhibits no edema.  Patient's left leg is in brace  Neurological: He is alert and oriented to person, place, and time.     Assessment/Plan: [name] is a 70 year old male with a past medical history of diabetes mellitus, tobacco abuse, TKA in 2014, recent nephrolithiasis status post stent who [facility] with worsening left knee pain for the past 48 hours secondary to septic left knee. He is currently status post excision arthroplasty and placement of antibiotic spacer. He is postop day 1.  1. 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 He has been followed by  infectious diseases and has failed treatment with Ancef, rifampin and cephalexin. He is currently status post excision arthroplasty and placement of antibiotic spacers. -- Cefazolin, infectious diseases consult for further antibiotic management -- Morphine 4 mg every 4 hours as needed for pain -- Zofran 4 mg every 6 hours as needed for nausea -- Oxycodone 5-10 milligrams every 3 hours as needed for breakthrough pain -- Blood cultures will follow-up with results  2. Acute kidney injury Patient's most recent creatinine of 1.63 which is increased from 1.18. This increase in creatinine is most likely prerenal in etiology secondary to hypovolemia during surgery. We will continue with IV hydration and will hold any offending medications. -- Hold lisinopril -- Hold hydrochlorothiazide -- Repeat basic metabolic panel tomorrow -- Maintenance IV fluids with normal saline at 75 mL an hour  3. Diabetes mellitus -- Correction factor insulin per protocol -- Insulin glargine 60 units once daily at night -- Carbohydrate modified diet  4. Hypertension -- Hold lisinopril and hydrochlorothiazide in the setting of acute kidney injury  5. DVT/PE prophylaxis -- Lovenox per protocol   Dispo: Anticipated discharge in approximately 1-2 day(s).   Thomasene LotJames Raylene Carmickle, MD 05/29/2016, 9:01 AM Pager: [phone]

## 2016-05-29 NOTE — Progress Notes (Signed)
Patient ID: Marc SprinklesMichael R Tschantz, male   DOB: 02/19/1946, 70 y.o.   MRN: [id number] Looks better over all, but "feels rough".  We were able to take out all left knee components and place a temporary antibiotic spacer.  He is currently on ancef 2 g IV every 8 hours.  He has been followed by ID here (Dr.Snider). ID needs to be re-consulted.  He will need a PICC line and a minimum of 6 week of IV antibiotics prior to considering revision surgery.  Will likely need short-term skilled nursing placement.  Therapy can work with mobility and ADL's with 50% weight bearing on his left leg.

## 2016-05-30 ENCOUNTER — Inpatient Hospital Stay (HOSPITAL_COMMUNITY): Payer: Medicare Other

## 2016-05-30 DIAGNOSIS — Z95828 Presence of other vascular implants and grafts: Secondary | ICD-10-CM

## 2016-05-30 DIAGNOSIS — M00062 Staphylococcal arthritis, left knee: Secondary | ICD-10-CM

## 2016-05-30 DIAGNOSIS — D62 Acute posthemorrhagic anemia: Secondary | ICD-10-CM

## 2016-05-30 DIAGNOSIS — B9561 Methicillin susceptible Staphylococcus aureus infection as the cause of diseases classified elsewhere: Secondary | ICD-10-CM

## 2016-05-30 DIAGNOSIS — B957 Other staphylococcus as the cause of diseases classified elsewhere: Secondary | ICD-10-CM

## 2016-05-30 LAB — ECHOCARDIOGRAM COMPLETE
E decel time: 330 msec
EERAT: 11.76
FS: 22 % — AB (ref 28–44)
HEIGHTINCHES: 71 in
IVS/LV PW RATIO, ED: 0.87
LA ID, A-P, ES: 35 mm
LA diam end sys: 35 mm
LADIAMINDEX: 1.39 cm/m2
LAVOL: 60.3 mL
LAVOLA4C: 68.2 mL
LAVOLIN: 23.9 mL/m2
LDCA: 3.8 cm2
LV E/e' medial: 11.76
LV E/e'average: 11.76
LV PW d: 9.12 mm — AB (ref 0.6–1.1)
LV e' LATERAL: 10.2 cm/s
LVOT diameter: 22 mm
MV Dec: 330
MVPG: 6 mmHg
MVPKAVEL: 102 m/s
MVPKEVEL: 120 m/s
RV LATERAL S' VELOCITY: 18.3 cm/s
RV TAPSE: 26.7 mm
TDI e' lateral: 10.2
TDI e' medial: 10.1
WEIGHTICAEL: 4880 [oz_av]

## 2016-05-30 LAB — CBC
HEMATOCRIT: 28.5 % — AB (ref 39.0–52.0)
Hemoglobin: 9.3 g/dL — ABNORMAL LOW (ref 13.0–17.0)
MCH: 29.5 pg (ref 26.0–34.0)
MCHC: 32.6 g/dL (ref 30.0–36.0)
MCV: 90.5 fL (ref 78.0–100.0)
Platelets: 187 10*3/uL (ref 150–400)
RBC: 3.15 MIL/uL — AB (ref 4.22–5.81)
RDW: 13.9 % (ref 11.5–15.5)
WBC: 9.7 10*3/uL (ref 4.0–10.5)

## 2016-05-30 LAB — CULTURE, BODY FLUID W GRAM STAIN -BOTTLE

## 2016-05-30 LAB — CULTURE, BODY FLUID-BOTTLE

## 2016-05-30 LAB — BASIC METABOLIC PANEL
Anion gap: 7 (ref 5–15)
BUN: 30 mg/dL — ABNORMAL HIGH (ref 6–20)
CHLORIDE: 104 mmol/L (ref 101–111)
CO2: 24 mmol/L (ref 22–32)
Calcium: 8.1 mg/dL — ABNORMAL LOW (ref 8.9–10.3)
Creatinine, Ser: 1.34 mg/dL — ABNORMAL HIGH (ref 0.61–1.24)
GFR calc non Af Amer: 52 mL/min — ABNORMAL LOW (ref >60)
Glucose, Bld: 231 mg/dL — ABNORMAL HIGH (ref 65–99)
POTASSIUM: 4 mmol/L (ref 3.5–5.1)
SODIUM: 135 mmol/L (ref 135–145)

## 2016-05-30 LAB — GLUCOSE, CAPILLARY
GLUCOSE-CAPILLARY: 228 mg/dL — AB (ref 65–99)
Glucose-Capillary: 230 mg/dL — ABNORMAL HIGH (ref 65–99)
Glucose-Capillary: 218 mg/dL — ABNORMAL HIGH (ref 65–99)
Glucose-Capillary: 230 mg/dL — ABNORMAL HIGH (ref 65–99)

## 2016-05-30 MED ORDER — OXYCODONE HCL 5 MG PO TABS
5.0000 mg | ORAL_TABLET | ORAL | Status: DC | PRN
  Administered 2016-05-30 – 2016-06-01 (×4): 10 mg via ORAL
  Filled 2016-05-30 (×3): qty 2

## 2016-05-30 MED ORDER — INSULIN GLARGINE 100 UNIT/ML ~~LOC~~ SOLN
70.0000 [IU] | Freq: Every day | SUBCUTANEOUS | Status: DC
  Administered 2016-05-30 – 2016-06-01 (×3): 70 [IU] via SUBCUTANEOUS
  Filled 2016-05-30 (×3): qty 0.7

## 2016-05-30 MED ORDER — SODIUM CHLORIDE 0.9% FLUSH
10.0000 mL | INTRAVENOUS | Status: DC | PRN
  Administered 2016-05-31 – 2016-06-01 (×2): 10 mL
  Filled 2016-05-30 (×2): qty 40

## 2016-05-30 MED ORDER — SODIUM CHLORIDE 0.9% FLUSH
10.0000 mL | Freq: Two times a day (BID) | INTRAVENOUS | Status: DC
Start: 2016-05-30 — End: 2016-06-01
  Administered 2016-05-30: 10 mL

## 2016-05-30 NOTE — Progress Notes (Addendum)
INFECTIOUS DISEASE PROGRESS NOTE  ID: Marc Vasquez is a 70 y.o. male with  Principal Problem:   Septic arthritis ([place]) Active Problems:   Staph aureus infection   Infection of prosthetic left knee joint ([place])   Poorly controlled type 2 diabetes mellitus ([place])   Diabetic neuropathy ([place])   Essential hypertension  Subjective: Feels well.   Abtx:  Anti-infectives    Start     Dose/Rate Route Frequency Ordered Stop   05/28/16 2200  ceFAZolin (ANCEF) IVPB 2g/100 mL premix     2 g 200 mL/hr over 30 Minutes Intravenous Every 8 hours 05/28/16 1956     05/28/16 2200  ceFAZolin (ANCEF) IVPB 2g/100 mL premix  Status:  Discontinued     2 g 200 mL/hr over 30 Minutes Intravenous Every 8 hours 05/28/16 1957 05/28/16 2122   05/28/16 2000  vancomycin (VANCOCIN) 1,250 mg in sodium chloride 0.9 % 250 mL IVPB  Status:  Discontinued     1,250 mg 166.7 mL/hr over 90 Minutes Intravenous Every 12 hours 05/28/16 1104 05/28/16 1956   05/28/16 1748  vancomycin (VANCOCIN) powder  Status:  Discontinued       As needed 05/28/16 1748 05/28/16 1941   05/28/16 1748  tobramycin (NEBCIN) powder  Status:  Discontinued       As needed 05/28/16 1749 05/28/16 1941   05/28/16 0715  vancomycin (VANCOCIN) 2,000 mg in sodium chloride 0.9 % 500 mL IVPB     2,000 mg 250 mL/hr over 120 Minutes Intravenous  Once 05/28/16 0659 05/28/16 1115   05/28/16 0700  vancomycin (VANCOCIN) 1,250 mg in sodium chloride 0.9 % 250 mL IVPB  Status:  Discontinued     1,250 mg 166.7 mL/hr over 90 Minutes Intravenous  Once 05/28/16 0648 05/28/16 0658   05/28/16 0700  piperacillin-tazobactam (ZOSYN) IVPB 3.375 g     3.375 g 100 mL/hr over 30 Minutes Intravenous  Once 05/28/16 0648 05/28/16 0858      Medications:  Scheduled: . sodium chloride   Intravenous Once  .  ceFAZolin (ANCEF) IV  2 g Intravenous Q8H  . clopidogrel  75 mg Oral Daily  . insulin aspart  0-20 Units Subcutaneous TID WC  . insulin glargine  70 Units Subcutaneous  Daily  . pantoprazole  40 mg Oral Daily  . sodium chloride flush  10-40 mL Intracatheter Q12H  . tamsulosin  0.4 mg Oral Daily    Objective: Vital signs in last 24 hours: Temp:  [98.5 F (36.9 C)-99.8 F (37.7 C)] 99.8 F (37.7 C) (08/27 1300) Pulse Rate:  [88-97] 97 (08/27 1300) BP: (140-146)/(56) 140/56 (08/27 1300) SpO2:  [93 %-96 %] 96 % (08/27 1300)   General appearance: alert, cooperative and no distress Extremities: LLE wrapped.   Lab Results  Recent Labs  05/28/16 0219 05/29/16 0632 05/30/16 0330  WBC 9.8  --  9.7  HGB 11.7*  --  9.3*  HCT 36.2*  --  28.5*  NA 138 135 135  K 4.1 4.9 4.0  CL 107 102 104  CO2 24 21* 24  BUN 16 23* 30*  CREATININE 1.18 1.63* 1.34*   Liver Panel No results for input(s): PROT, ALBUMIN, AST, ALT, ALKPHOS, BILITOT, BILIDIR, IBILI in the last 72 hours. Sedimentation Rate  Recent Labs  05/29/16 1517  ESRSEDRATE 100*   C-Reactive Protein  Recent Labs  05/28/16 0219 05/29/16 1517  CRP 2.2* 25.6*    Microbiology: Recent Results (from the past 240 hour(s))  Gram stain  Status: None   Collection Time: 05/28/16  4:25 AM  Result Value Ref Range Status   Specimen Description SYNOVIAL LEFT KNEE  Final   Special Requests NONE  Final   Gram Stain   Final    ABUNDANT WBC PRESENT, PREDOMINANTLY PMN NO ORGANISMS SEEN    Report Status 05/28/2016 FINAL  Final  Culture, body fluid-bottle     Status: Abnormal   Collection Time: 05/28/16  4:25 AM  Result Value Ref Range Status   Specimen Description SYNOVIAL LEFT KNEE  Final   Special Requests BOTTLES DRAWN AEROBIC AND ANAEROBIC 10CC  Final   Culture (A)  Final    STAPHYLOCOCCUS AUREUS CRITICAL RESULT CALLED TO, READ BACK BY AND VERIFIED WITH: A. Williamson, [place] [id number] BY Rhea Bleacher    Report Status 05/30/2016 FINAL  Final   Organism ID, Bacteria STAPHYLOCOCCUS AUREUS  Final      Susceptibility   Staphylococcus aureus - MIC*    CIPROFLOXACIN >=8 RESISTANT  Resistant     ERYTHROMYCIN >=8 RESISTANT Resistant     GENTAMICIN <=0.5 SENSITIVE Sensitive     OXACILLIN <=0.25 SENSITIVE Sensitive     TETRACYCLINE <=1 SENSITIVE Sensitive     VANCOMYCIN <=0.5 SENSITIVE Sensitive     TRIMETH/SULFA <=10 SENSITIVE Sensitive     CLINDAMYCIN <=0.25 SENSITIVE Sensitive     RIFAMPIN <=0.5 SENSITIVE Sensitive     Inducible Clindamycin NEGATIVE Sensitive     * STAPHYLOCOCCUS AUREUS  Blood culture (routine x 2)     Status: None (Preliminary result)   Collection Time: 05/28/16  8:00 AM  Result Value Ref Range Status   Specimen Description BLOOD RIGHT HAND  Final   Special Requests BOTTLES DRAWN AEROBIC AND ANAEROBIC  5CC  Final   Culture NO GROWTH 2 DAYS  Final   Report Status PENDING  Incomplete  Blood culture (routine x 2)     Status: None (Preliminary result)   Collection Time: 05/28/16  8:15 AM  Result Value Ref Range Status   Specimen Description BLOOD LEFT ANTECUBITAL  Final   Special Requests BOTTLES DRAWN AEROBIC AND ANAEROBIC  10CC  Final   Culture NO GROWTH 2 DAYS  Final   Report Status PENDING  Incomplete  Culture, blood (Routine X 2) w Reflex to ID Panel     Status: None (Preliminary result)   Collection Time: 05/28/16 10:17 AM  Result Value Ref Range Status   Specimen Description BLOOD LEFT ANTECUBITAL  Final   Special Requests IN PEDIATRIC BOTTLE 3CC  Final   Culture NO GROWTH 2 DAYS  Final   Report Status PENDING  Incomplete  Culture, blood (Routine X 2) w Reflex to ID Panel     Status: None (Preliminary result)   Collection Time: 05/28/16 10:20 AM  Result Value Ref Range Status   Specimen Description BLOOD BLOOD LEFT HAND  Final   Special Requests IN PEDIATRIC BOTTLE 3CC  Final   Culture NO GROWTH 2 DAYS  Final   Report Status PENDING  Incomplete  Surgical pcr screen     Status: None   Collection Time: 05/28/16  1:34 PM  Result Value Ref Range Status   MRSA, PCR NEGATIVE NEGATIVE Final   Staphylococcus aureus NEGATIVE NEGATIVE Final     Comment:        The Xpert SA Assay (FDA approved for NASAL specimens in patients over 87 years of age), is one component of a comprehensive surveillance program.  Test performance has been validated by  [facility] for patients greater than or equal to 31 year old. It is not intended to diagnose infection nor to guide or monitor treatment.     Studies/Results: Dg Knee Left Port  Result Date: 05/28/2016 CLINICAL DATA:  Removal of knee joint prosthetic components. EXAM: PORTABLE LEFT KNEE - 1-2 VIEW COMPARISON:  None. FINDINGS: Postoperative images demonstrate removal of the metal prosthetic components. Radio-opaque antibiotic impregnated spacers have been placed over the distal femur and proximal tibia, well positioned. IMPRESSION: Status post removal of infected knee prosthesis components with placement of antibiotic impregnated spacers. Electronically Signed   By: Lajean Manes M.D.   On: 05/28/2016 20:30     Assessment/Plan: Prosthetic Joint Infection DM2 with neuropathy Diarrhea  Would continue high dose ancef D/c planning (he has PIC) Possible rehab placement F/u appt with Dr Baxter Flattery in 2-4 weeks Plan for at least 6 weeks of ancef Available as needed.   Total days of antibiotics: 2 ancef  Diagnosis: Resection of infected Prosthetic Joint  Culture Result: MSSA  Allergies  Allergen Reactions  . Oxycontin [Oxycodone Hcl] Itching    Discharge antibiotics: Ancef 2g IVPB q8h Duration: 40 more days End Date: 07-09-16  [facility] Care Per Protocol:  Labs weekly while on IV antibiotics: _x_ CBC with differential __ BMP _x_ CMP _x_ CRP _x_ ESR __ Vancomycin trough  Fax weekly labs to [phone]  Clinic Follow Up Appt: Dr Baxter Flattery 2-4 weeks.        Bobby Rumpf Infectious Diseases (pager) [phone] [email or web address] 05/30/2016, 2:36 PM  LOS: 2 days

## 2016-05-30 NOTE — Evaluation (Signed)
Physical Therapy Evaluation Patient Details Name: Marc Vasquez MRN: [id number] DOB: 09/30/1946 Today's Date: 05/30/2016   History of Present Illness  Pt is a 70 y/o M who presented with septic arthritis now s/p hardware removal and antibiotic spacer placement 8/25.  Pt reports he had an infection of his Lt knee prosthesis in March, was admitted to [facility] where he required an I and D for MSSA infection. Pt's PMH includes depression, diabetic neuropathy, former smoker, stroke, lumbago.     Clinical Impression  Patient is s/p above surgery resulting in functional limitations due to the deficits listed below (see PT Problem List). Marc Vasquez presents with general deconditioning and requires up to max +2 assist for bed mobility.  Pt unable to stand from bed this session.  Patient will benefit from skilled PT to increase their independence and safety with mobility to allow discharge to the venue listed below.      Follow Up Recommendations SNF;Supervision for mobility/OOB    Equipment Recommendations  None recommended by PT    Recommendations for Other Services OT consult     Precautions / Restrictions Precautions Precautions: Fall Required Braces or Orthoses: Knee Immobilizer - Left (in room, no specific orders) Restrictions Weight Bearing Restrictions: Yes LLE Weight Bearing: Partial weight bearing LLE Partial Weight Bearing Percentage or Pounds: 50%      Mobility  Bed Mobility Overal bed mobility: Needs Assistance;+2 for physical assistance Bed Mobility: Supine to Sit;Sit to Supine;Rolling Rolling: Mod assist   Supine to sit: Mod assist;HOB elevated Sit to supine: Max assist;+2 for physical assistance   General bed mobility comments: Cues for technique and assist to elevate trunk and to advance LEs.  Once sitting pt's Lt LE uncomfortable due to bed rail and pt scoots to end of bed.  [facility]; however, due to "bump" in bed pt remained at end  of bed.  Foot board removed and pt assisted into supine with LEs supported.  Pt [facility] with [facility] to [facility] with bed in trendelenburg.  Pt then assisted with rolling for pericare.  Transfers                 General transfer comment: Attempted sit>stand but pt unable  Ambulation/Gait                Stairs            Wheelchair Mobility    Modified Rankin (Stroke Patients Only)       Balance Overall balance assessment: Needs assistance Sitting-balance support: Single extremity supported;Feet supported Sitting balance-Leahy Scale: Fair                                       Pertinent Vitals/Pain Pain Assessment: Faces Faces Pain Scale: Hurts even more Pain Location: Lt knee Pain Descriptors / Indicators: Aching;Grimacing;Guarding;Moaning Pain Intervention(s): Limited activity within patient's tolerance;Monitored during session;Repositioned    Home Living Family/patient expects to be discharged to:: Skilled nursing facility                      Prior Function Level of Independence: Independent         Comments: Has been wearing depends since March due to antibiotics     Hand Dominance        Extremity/Trunk Assessment   Upper Extremity Assessment: Defer to OT evaluation  Lower Extremity Assessment: LLE deficits/detail   LLE Deficits / Details: limited strength and ROM as expected.     Communication   Communication: No difficulties  Cognition Arousal/Alertness: Awake/alert Behavior During Therapy: WFL for tasks assessed/performed Overall Cognitive Status: Within Functional Limits for tasks assessed                      General Comments      Exercises General Exercises - Lower Extremity Ankle Circles/Pumps: AROM;Both;10 reps;Supine Quad Sets: Strengthening;Both;10 reps;Supine Hip ABduction/ADduction: AAROM;Left;5 reps;Supine Straight Leg Raises: AAROM;Left;5 reps;Supine       Assessment/Plan    PT Assessment Patient needs continued PT services  PT Diagnosis Difficulty walking;Generalized weakness;Acute pain   PT Problem List Decreased strength;Decreased range of motion;Decreased activity tolerance;Decreased balance;Decreased mobility;Decreased knowledge of use of DME;Decreased safety awareness;Pain;Obesity  PT Treatment Interventions DME instruction;Gait training;Functional mobility training;Therapeutic activities;Therapeutic exercise;Balance training;Patient/family education;Wheelchair mobility training;Modalities   PT Goals (Current goals can be found in the Care Plan section) Acute Rehab PT Goals Patient Stated Goal: to get stronger PT Goal Formulation: With patient/family Time For Goal Achievement: 06/13/16 Potential to Achieve Goals: Fair    Frequency Min 3X/week   Barriers to discharge        Co-evaluation               End of Session Equipment Utilized During Treatment: Gait belt;Left knee immobilizer Activity Tolerance: Patient limited by fatigue;Patient limited by pain Patient left: in bed;with call bell/phone within reach;with bed alarm set;with nursing/sitter in room;with SCD's reapplied;with family/visitor present Nurse Communication: Mobility status;Need for lift equipment;Weight bearing status         Time: 1610-9604 PT Time Calculation (min) (ACUTE ONLY): 44 min   Charges:   PT Evaluation $PT Eval High Complexity: 1 Procedure PT Treatments $Therapeutic Exercise: 8-22 mins $Therapeutic Activity: 8-22 mins   PT G Codes:        Encarnacion Chu PT, DPT  Pager: [phone] Phone: [phone] 05/30/2016, 4:34 PM

## 2016-05-30 NOTE — Progress Notes (Signed)
  Date: 05/30/2016  Patient name: Marc SprinklesMichael R Hyneman  Medical record number: [id number]  Date of birth: 11/30/1945   Patient seen and examined. Case d/w residents in detail. I agree with findings and plan as documented in Dr. Marcelino FreestoneAhmed's note.   Patient with episodes of n/v yesterday which have now improved. He complains of diarrhea since initiation of abx - 3-4 episodes over the last 24 hours. He also complains of pain in the left knee. He is now s/p removal of infected left knee prosthesis and placement of an abx spacer. PICC line in place. ID f/u appreciated. C/w IV ancef for now. Fluid cx growing staph. Will f/u sensitivities. PT to evaluate patient.   Patient also noted to have acute blood loss anemia likely post op. Will monitor closely and transfuse for Hg<7   Earl LagosNischal Maila Dukes, MD 05/30/2016, 10:10 AM

## 2016-05-30 NOTE — Progress Notes (Signed)
Patient stable, pain controlled, nausea resolved. Cultures show MSSA - appreciate ID recs 50 % PWB Up with PT  N. Glee ArvinMichael Xu, MD [facility] [phone] 1:02 PM

## 2016-05-30 NOTE — Progress Notes (Addendum)
Peripherally Inserted Central Catheter/Midline Placement  The IV Nurse has discussed with the patient and/or persons authorized to consent for the patient, the purpose of this procedure and the potential benefits and risks involved with this procedure.  The benefits include less needle sticks, lab draws from the catheter, ability to perform PICC exchange if ordered by the physician and patient may be discharged home with the catheter.  Risks include, but not limited to, infection, bleeding, blood clot (thrombus formation), and puncture of an artery; nerve damage and irregular heart beat.  Alternatives to this procedure were also discussed. [facility] educational packet given to pt.  PICC/Midline Placement Documentation  PICC Single Lumen 05/30/16 PICC Right Cephalic 41 cm 0 cm (Active)  Indication for Insertion or Continuance of Line Home intravenous therapies (PICC only) 05/30/2016  8:56 AM  Exposed Catheter (cm) 0 cm 05/30/2016  8:56 AM  Site Assessment Clean;Dry;Intact 05/30/2016  8:56 AM  Line Status Flushed;Saline locked;Blood return noted 05/30/2016  8:56 AM  Dressing Type Transparent 05/30/2016  8:56 AM  Dressing Status Clean;Dry;Intact 05/30/2016  8:56 AM  Dressing Change Due 06/06/16 05/30/2016  8:56 AM       Ethelda Chickurrie, Allen Robert 05/30/2016, 9:10 AM

## 2016-05-30 NOTE — Progress Notes (Signed)
   Subjective:   Patient is doing well. Has few episodes of nausea and vomitting yesterday after eating breakfast. No further emesis since then. Nausea is better today, still having poor appetite. Had 3 loose stools yesterday. 1x this morning. Having pain on left knee at surgical site, rates as 5/10. No sob or chest pain or any other complaints.   Objective:  Vital signs in last 24 hours: Vitals:   05/28/16 2045 05/29/16 0541 05/29/16 1300 05/29/16 2010  BP: (!) 145/67 (!) 154/60 (!) 152/65 (!) 146/56  Pulse: 89 (!) 103 98 88  Resp: 20     Temp: 98.1 F (36.7 C) 99.3 F (37.4 C) 99.7 F (37.6 C) 98.5 F (36.9 C)  TempSrc:  Oral Oral Oral  SpO2: 95% 100% 97% 93%  Weight:      Height:       Vitals reviewed. General: resting in bed, NAD HEENT: PERRL, EOMI, no scleral icterus Cardiac: RRR, systolic murmur Pulm: clear to auscultation bilaterally Abd: obese abdomen, no tenderness.  Ext: warm and well perfused, no pedal edema. Left knee/leg covered with brace.  Neuro: alert and oriented X3  Assessment/Plan:  Principal Problem:   Septic arthritis ([facility]) Active Problems:   Staph aureus infection   Infection of prosthetic left knee joint ([facility])   Poorly controlled type 2 diabetes mellitus ([facility])   Diabetic neuropathy ([facility])   Essential hypertension  PJI of left knee - s/p hardware removal and antibiotic spacer placement 05/28/16 bcx NGTD. Was on 8 weeks of ancef and then keflex+rifampin. Here with re-infection.  - appreciate ID rec. Cont ancef for now, await for cultures. This is likely his previous staph that was seen in the past.  - place PICC line for prolonged abx therapy - appreciate ortho assistance  - work with PT/OT, f/up their recs.   Poorly controlled DM II with neuropathy  takes close to ~200units  of insulin u500 total at home. Was put on lantus 60 here yesterday + ssi-r - will switch to lantus 70 units + cont ssi-r  AKI - had crt bump from 1.18 to 1.63. Now coming  down with fluids. - this is likely pre-renal from being NPO before surgery and perhaps from blood loss during surgery - will cont IVF and follow.   Acute blood loss anemia - hgb dropped to 9.3, from 11.8 on admission likely from surgical blood loss. Continue to trend hgb. Transfuse pRBC if hgb <7.0  Mild diarrhea - likely from abx side effect - if diarrhea continues, will need to check cdiff.   Dispo: Anticipated discharge in approximately 2-3 days.   Marc Meekerasrif Marc Leichter, MD 05/30/2016, 7:22 AM Pager: [phone]

## 2016-05-30 NOTE — Progress Notes (Signed)
PT Cancellation Note  Patient Details Name: Marc SprinklesMichael R Vasquez MRN: [id number] DOB: 04/07/1946   Cancelled Treatment:    Reason Eval/Treat Not Completed: Patient at procedure or test/unavailable ([place]).  Will follow acutely.   Encarnacion ChuAshley Wagner Tanzi PT, DPT  Pager: [phone] Phone: [phone] 05/30/2016, 1:45 PM

## 2016-05-30 NOTE — Progress Notes (Signed)
  Echocardiogram 2D Echocardiogram has been performed.  Marc Vasquez, Marc Vasquez 05/30/2016, 1:54 PM

## 2016-05-31 ENCOUNTER — Encounter (HOSPITAL_COMMUNITY): Payer: Self-pay | Admitting: Orthopaedic Surgery

## 2016-05-31 ENCOUNTER — Encounter (HOSPITAL_COMMUNITY)
Admission: EM | Disposition: A | Discharge status: Skilled Nursing Facility | Payer: Self-pay | Source: Home / Self Care | Attending: Internal Medicine

## 2016-05-31 ENCOUNTER — Inpatient Hospital Stay (HOSPITAL_COMMUNITY): Payer: Medicare Other

## 2016-05-31 DIAGNOSIS — I359 Nonrheumatic aortic valve disorder, unspecified: Secondary | ICD-10-CM

## 2016-05-31 DIAGNOSIS — I340 Nonrheumatic mitral (valve) insufficiency: Secondary | ICD-10-CM

## 2016-05-31 HISTORY — PX: TEE WITHOUT CARDIOVERSION: SHX5443

## 2016-05-31 LAB — BASIC METABOLIC PANEL
ANION GAP: 8 (ref 5–15)
BUN: 26 mg/dL — AB (ref 6–20)
CO2: 24 mmol/L (ref 22–32)
Calcium: 8.4 mg/dL — ABNORMAL LOW (ref 8.9–10.3)
Chloride: 103 mmol/L (ref 101–111)
Creatinine, Ser: 1.19 mg/dL (ref 0.61–1.24)
Glucose, Bld: 189 mg/dL — ABNORMAL HIGH (ref 65–99)
POTASSIUM: 3.3 mmol/L — AB (ref 3.5–5.1)
SODIUM: 135 mmol/L (ref 135–145)

## 2016-05-31 LAB — CBC
HEMATOCRIT: 26.2 % — AB (ref 39.0–52.0)
Hemoglobin: 8.7 g/dL — ABNORMAL LOW (ref 13.0–17.0)
MCH: 29.5 pg (ref 26.0–34.0)
MCHC: 33.2 g/dL (ref 30.0–36.0)
MCV: 88.8 fL (ref 78.0–100.0)
Platelets: 196 10*3/uL (ref 150–400)
RBC: 2.95 MIL/uL — AB (ref 4.22–5.81)
RDW: 13.6 % (ref 11.5–15.5)
WBC: 7.4 10*3/uL (ref 4.0–10.5)

## 2016-05-31 LAB — GLUCOSE, CAPILLARY
GLUCOSE-CAPILLARY: 202 mg/dL — AB (ref 65–99)
Glucose-Capillary: 210 mg/dL — ABNORMAL HIGH (ref 65–99)
Glucose-Capillary: 104 mg/dL — ABNORMAL HIGH (ref 65–99)
Glucose-Capillary: 183 mg/dL — ABNORMAL HIGH (ref 65–99)

## 2016-05-31 SURGERY — ECHOCARDIOGRAM, TRANSESOPHAGEAL
Anesthesia: Moderate Sedation

## 2016-05-31 MED ORDER — SODIUM CHLORIDE 0.9 % IV SOLN: INTRAVENOUS | Status: DC

## 2016-05-31 MED ORDER — DIPHENHYDRAMINE HCL 50 MG/ML IJ SOLN
INTRAMUSCULAR | Status: AC
  Filled 2016-05-31: qty 1

## 2016-05-31 MED ORDER — FENTANYL CITRATE (PF) 100 MCG/2ML IJ SOLN
INTRAMUSCULAR | Status: DC | PRN
  Administered 2016-05-31 (×2): 25 ug via INTRAVENOUS

## 2016-05-31 MED ORDER — MIDAZOLAM HCL 10 MG/2ML IJ SOLN
INTRAMUSCULAR | Status: DC | PRN
Start: 2016-05-31 — End: 2016-05-31
  Administered 2016-05-31 (×2): 2 mg via INTRAVENOUS

## 2016-05-31 MED ORDER — POTASSIUM CHLORIDE CRYS ER 10 MEQ PO TBCR: 30.0000 meq | EXTENDED_RELEASE_TABLET | Freq: Once | ORAL | Status: DC

## 2016-05-31 MED ORDER — MIDAZOLAM HCL 5 MG/ML IJ SOLN
INTRAMUSCULAR | Status: AC
  Filled 2016-05-31: qty 2

## 2016-05-31 MED ORDER — POTASSIUM CHLORIDE CRYS ER 20 MEQ PO TBCR
40.0000 meq | EXTENDED_RELEASE_TABLET | Freq: Once | ORAL | Status: AC
  Administered 2016-05-31: 40 meq via ORAL
  Filled 2016-05-31: qty 2

## 2016-05-31 MED ORDER — FENTANYL CITRATE (PF) 100 MCG/2ML IJ SOLN
INTRAMUSCULAR | Status: AC
  Filled 2016-05-31: qty 2

## 2016-05-31 NOTE — Care Management Important Message (Signed)
Important Message  Patient Details  Name: Marc SprinklesMichael R Spampinato MRN: [id number] Date of Birth: 11/03/1945   Medicare Important Message Given:  Yes    Aries Kasa 05/31/2016, 12:12 PM

## 2016-05-31 NOTE — Interval H&P Note (Signed)
History and Physical Interval Note:  05/31/2016 3:06 PM  Marc Vasquez  has presented today for surgery, with the diagnosis of bacteremia  The various methods of treatment have been discussed with the patient and family. After consideration of risks, benefits and other options for treatment, the patient has consented to  Procedure(s): TRANSESOPHAGEAL ECHOCARDIOGRAM (TEE) (N/A) as a surgical intervention .  The patient's history has been reviewed, patient examined, no change in status, stable for surgery.  I have reviewed the patient's chart and labs.  Questions were answered to the patient's satisfaction.     Coca ColaMark Skains

## 2016-05-31 NOTE — [place] FL2 (Addendum)
[place] MEDICAID FL2 LEVEL OF CARE SCREENING TOOL     IDENTIFICATION  Patient Name: Marc Vasquez Birthdate: [birth year] Sex: male Admission Date (Current Location): 05/28/2016  [facility] and [place] Number:  [employer] and Address:  [facility], [address] [place], [place] [address]      Provider Number: [id number]  Attending Physician Name and Address:  [name], MD  Relative Name and Phone Number:       Current Level of Care: Hospital Recommended Level of Care: Skilled Nursing Facility Prior Approval Number:    Date Approved/Denied:   PASRR Number:  [id number])  Discharge Plan: SNF    Current Diagnoses: Patient Active Problem List   Diagnosis Date Noted  . Septic arthritis (HCC) 05/28/2016  . Staph aureus infection 01/07/2016  . Infection of prosthetic left knee joint (HCC) 01/07/2016  . History of arthroplasty of left knee 01/07/2016  . Former smoker 01/07/2016  . Poorly controlled type 2 diabetes mellitus (HCC) 01/07/2016  . History of CVA (cerebrovascular accident) 01/07/2016  . Diabetic neuropathy (HCC) 01/07/2016  . Essential hypertension 01/07/2016    Orientation RESPIRATION BLADDER Height & Weight     Self, Time, Situation, Place    Continent Weight: (!) 305 lb (138.3 kg) Height:  5\' 11"  (180.3 cm)  BEHAVIORAL SYMPTOMS/MOOD NEUROLOGICAL BOWEL NUTRITION STATUS      Continent    AMBULATORY STATUS COMMUNICATION OF NEEDS Skin   Extensive Assist Verbally Normal                       Personal Care Assistance Level of Assistance  Bathing, Dressing Bathing Assistance: Limited assistance   Dressing Assistance: Limited assistance     Functional Limitations Info             SPECIAL CARE FACTORS FREQUENCY                       Contractures      Additional Factors Info                  Current Medications (05/31/2016):  This is the current hospital active medication list Current  Facility-Administered Medications  Medication Dose Route Frequency Provider Last Rate Last Dose  . 0.9 %  sodium chloride infusion   Intravenous Once Azalia Bilis, MD      . 0.9 %  sodium chloride infusion   Intravenous Continuous Kathryne Hitch, MD 75 mL/hr at 05/29/16 1124    . acetaminophen (TYLENOL) tablet 650 mg  650 mg Oral Q6H PRN Kathryne Hitch, MD       Or  . acetaminophen (TYLENOL) suppository 650 mg  650 mg Rectal Q6H PRN Kathryne Hitch, MD      . ceFAZolin (ANCEF) IVPB 2g/100 mL premix  2 g Intravenous Q8H Kathryne Hitch, MD   2 g at 05/31/16 5621  . clopidogrel (PLAVIX) tablet 75 mg  75 mg Oral Daily Kathryne Hitch, MD   75 mg at 05/31/16 1116  . diphenhydrAMINE (BENADRYL) 12.5 MG/5ML elixir 12.5-25 mg  12.5-25 mg Oral Q4H PRN Kathryne Hitch, MD      . insulin aspart (novoLOG) injection 0-20 Units  0-20 Units Subcutaneous TID WC Denton Brick, MD   7 Units at 05/31/16 1223  . insulin glargine (LANTUS) injection 70 Units  70 Units Subcutaneous Daily Tasrif Ahmed, MD   70 Units at 05/31/16 1116  .  lactated ringers infusion   Intravenous Continuous Arta BruceKevin Ossey, MD      . methocarbamol (ROBAXIN) tablet 500 mg  500 mg Oral Q6H PRN Kathryne Hitchhristopher Y Blackman, MD   500 mg at 05/30/16 2227   Or  . methocarbamol (ROBAXIN) 500 mg in dextrose 5 % 50 mL IVPB  500 mg Intravenous Q6H PRN Kathryne Hitchhristopher Y Blackman, MD      . metoCLOPramide (REGLAN) tablet 5-10 mg  5-10 mg Oral Q8H PRN Kathryne Hitchhristopher Y Blackman, MD       Or  . metoCLOPramide (REGLAN) injection 5-10 mg  5-10 mg Intravenous Q8H PRN Kathryne Hitchhristopher Y Blackman, MD   10 mg at 05/29/16 1313  . ondansetron (ZOFRAN) tablet 4 mg  4 mg Oral Q6H PRN Kathryne Hitchhristopher Y Blackman, MD       Or  . ondansetron [facility](ZOFRAN) injection 4 mg  4 mg Intravenous Q6H PRN Kathryne Hitchhristopher Y Blackman, MD   4 mg at 05/31/16 0830  . oxyCODONE (Oxy IR/ROXICODONE) immediate release tablet 5-10 mg  5-10 mg Oral Q4H PRN Hyacinth Meekerasrif Ahmed, MD   10  mg at 05/30/16 2227  . pantoprazole (PROTONIX) EC tablet 40 mg  40 mg Oral Daily Tasrif Ahmed, MD   40 mg at 05/31/16 1000  . potassium chloride (K-DUR,KLOR-CON) CR tablet 30 mEq  30 mEq Oral Once Thomasene LotJames Alison Kubicki, MD      . sodium chloride flush (NS) 0.9 % injection 10-40 mL  10-40 mL Intracatheter Q12H Nischal Narendra, MD   10 mL at 05/30/16 0929  . sodium chloride flush (NS) 0.9 % injection 10-40 mL  10-40 mL Intracatheter PRN Earl LagosNischal Narendra, MD   10 mL at 05/31/16 0516  . tamsulosin (FLOMAX) capsule 0.4 mg  0.4 mg Oral Daily Tasrif Ahmed, MD   0.4 mg at 05/31/16 1000     Discharge Medications: Please see discharge summary for a list of discharge medications.  Relevant Imaging Results:  Relevant Lab Results:   Additional Information  (SS: [id number])  Alene MiresWhitaker, Brittney R

## 2016-05-31 NOTE — Progress Notes (Signed)
  Echocardiogram Echocardiogram Transesophageal has been performed.  Marc Vasquez 05/31/2016, 3:42 PM

## 2016-05-31 NOTE — Progress Notes (Addendum)
Inpatient Diabetes Program Recommendations  AACE/ADA: New Consensus Statement on Inpatient Glycemic Control (2015)  Target Ranges:  Prepandial:   less than 140 mg/dL      Peak postprandial:   less than 180 mg/dL (1-2 hours)      Critically ill patients:  140 - 180 mg/dL   Results for Marc SprinklesCOX, Ballard R (MRN [id number]) as of 05/31/2016 11:47  Ref. Range 05/30/2016 05:17 05/30/2016 11:26 05/30/2016 16:25 05/30/2016 23:10 05/31/2016 06:41 05/31/2016 11:32  Glucose-Capillary Latest Ref Range: 65 - 99 mg/dL 132230 (H) 440218 (H) 102228 (H) 230 (H) 210 (H) 202 (H)   Review of Glycemic Control  Diabetes history: DM2 Outpatient Diabetes medications: Humulin R U500 120-130 units with breakfast, 20-30 units with lunch, 60-70 units with supper Current orders for Inpatient glycemic control: Lantus 70 units daily, Novolog 0-20 units TID with meals  Inpatient Diabetes Program Recommendations: Insulin - Basal: Please consider increasing Lantus to 74 units daily. Correction (SSI): Please consider ordering Novolog bedtime correction scale. Insulin - Meal Coverage: When diet is resumed, please consider ordering Novolog 6 units TID with meals for meal coverage if patient eats at least 50% of meals.  Thanks, Marc PennerMarie Reiana Poteet, RN, MSN, CDE Diabetes Coordinator Inpatient Diabetes Program [phone] (Team Pager from 8am to 5pm) [phone] (AP office) [phone] [facility] office) [phone] [facility] office)

## 2016-05-31 NOTE — CV Procedure (Signed)
   TEE  Indications: bacteremia  Findings:  No vegetations. No aortic valve vegetation Normal EF PICC line in RA  Donato SchultzMark Allisson Schindel, MD

## 2016-05-31 NOTE — Progress Notes (Signed)
OT Cancellation Note  Patient Details Name: Marc Vasquez MRN: [id number] DOB: 05/11/1946   Cancelled Treatment:    Reason Eval/Treat Not Completed: Patient at procedure or test/ unavailable. Pt in surgery, OT will evaluate post-op when Pt appropriate.  [name] 05/31/2016, 3:44 PM

## 2016-05-31 NOTE — H&P (View-Only) (Signed)
   Subjective: No acute events overnight. Patient denies any additional episodes of emesis. He does continue to have nausea which is controlled with medications. He is currently postop day #3 afebrile and hemodynamically stable. He has no additional acute complaints or concerns this morning.  Objective:  Vital signs in last 24 hours: Vitals:   05/29/16 2010 05/30/16 1300 05/30/16 2258 05/31/16 0415  BP: (!) 146/56 (!) 140/56 (!) 151/61 (!) 139/53  Pulse: 88 97 87 80  Resp:    18  Temp: 98.5 F (36.9 C) 99.8 F (37.7 C) 97.9 F (36.6 C) 98.5 F (36.9 C)  TempSrc: Oral Oral Oral Oral  SpO2: 93% 96% 97% 97%  Weight:      Height:       Physical Exam  Constitutional: He is oriented to person, place, and time. He appears well-developed and well-nourished.  Pleasant, obese, in no acute distress  HENT:  Head: Normocephalic and atraumatic.  Cardiovascular: Normal rate and regular rhythm.  Exam reveals no friction rub.   No murmur heard. Respiratory: Effort normal and breath sounds normal. No respiratory distress. He has no wheezes.  GI: Soft. Bowel sounds are normal. He exhibits no distension.  No abdominal bruits auscultated  Musculoskeletal: He exhibits no edema.  Left leg with surgical incision currently covered with bandage. There is no surrounding erythema or signs of infection.  Neurological: He is alert and oriented to person, place, and time.     Assessment/Plan: Mr. Gilpin is a 70-year-old male with a past medical history of diabetes mellitus, tobacco abuse, TKA in 2014, recent nephrolithiasis status post stent who presented to the [place] emergency department with worsening left knee pain for the past 48 hours secondary to septic left knee. He is currently status post excision arthroplasty and placement of antibiotic spacer.   In summary, patient is postop day 3 and is afebrile, without leukocytosis and hemodynamically stable. Transthoracic echocardiogram from 05/30/2016  demonstrated a potential aortic valve vegetation and he will have transesophageal echocardiogram today for further evaluation. Additionally, per infectious disease recommendation the patient will need 6 weeks of cefazolin and placement in skilled nursing facility per physical therapy.  1. Prosthetic joint infection Patient with left knee prosthetic joint infection. He has been followed by  infectious diseases and has failed treatment with Ancef, rifampin and cephalexin. He is currently status post excision arthroplasty and placement of antibiotic spacers. -- Continue cefazolin Will need 6 weeks -- Oxycodone immediate release 5-10 milligrams every 4 hours as needed for pain -- Zofran 4 mg every 6 hours as needed for nausea -- Blood cultures- no growth in 2 days  2. Acute kidney injury, resolved -- Most recent creatinine of 1.19 which is around the patient's baseline -- Hold lisinopril -- Hold hydrochlorothiazide -- Repeat basic metabolic panel tomorrow -- Maintenance IV fluids with normal saline at 75 mL an hour  3. Aortic valve vegetation Transthoracic echocardiogram from 05/30/2016 demonstrated a potential aortic valve vegetation. -- Further evaluation with transesophageal echocardiogram  4. Diabetes mellitus -- Correction factor insulin per protocol -- Insulin glargine 70 units once daily at night -- Carbohydrate modified diet  5. Hypertension -- Most recent blood pressure of 139/53 -- Hold lisinopril and hydrochlorothiazide in the setting of acute kidney injury  6. DVT/PE prophylaxis -- Lovenox per protocol   Dispo: Anticipated discharge in approximately 1-2 day(s).   Marlaya Turck, MD 05/31/2016, 10:29 AM Pager: [phone] 

## 2016-05-31 NOTE — Progress Notes (Signed)
Patient ID: Marc SprinklesMichael R Vasquez, male   DOB: 02/16/1946, 70 y.o.   MRN: [id number] Left knee dressing changed at the bedside.  Scant bloody drainage, no redness.  Cultures have shown sensitive staph.  Will need at leasy 6 weeks IV antibiotics, but ID will make final recs.

## 2016-05-31 NOTE — Progress Notes (Signed)
   Subjective: No acute events overnight. Patient denies any additional episodes of emesis. He does continue to have nausea which is controlled with medications. He is currently postop day #3 afebrile and hemodynamically stable. He has no additional acute complaints or concerns this morning.  Objective:  Vital signs in last 24 hours: Vitals:   05/29/16 2010 05/30/16 1300 05/30/16 2258 05/31/16 0415  BP: (!) 146/56 (!) 140/56 (!) 151/61 (!) 139/53  Pulse: 88 97 87 80  Resp:    18  Temp: 98.5 F (36.9 C) 99.8 F (37.7 C) 97.9 F (36.6 C) 98.5 F (36.9 C)  TempSrc: Oral Oral Oral Oral  SpO2: 93% 96% 97% 97%  Weight:      Height:       Physical Exam  Constitutional: He is oriented to person, place, and time. He appears well-developed and well-nourished.  Pleasant, obese, in no acute distress  HENT:  Head: Normocephalic and atraumatic.  Cardiovascular: Normal rate and regular rhythm.  Exam reveals no friction rub.   No murmur heard. Respiratory: Effort normal and breath sounds normal. No respiratory distress. He has no wheezes.  GI: Soft. Bowel sounds are normal. He exhibits no distension.  No abdominal bruits auscultated  Musculoskeletal: He exhibits no edema.  Left leg with surgical incision currently covered with bandage. There is no surrounding erythema or signs of infection.  Neurological: He is alert and oriented to person, place, and time.     Assessment/Plan: [name] is a 70 year old male with a past medical history of diabetes mellitus, tobacco abuse, TKA in 2014, recent nephrolithiasis status post stent who [facility] with worsening left knee pain for the past 48 hours secondary to septic left knee. He is currently status post excision arthroplasty and placement of antibiotic spacer.   In summary, patient is postop day 3 and is afebrile, without leukocytosis and hemodynamically stable. Transthoracic echocardiogram from 05/30/2016  demonstrated a potential aortic valve vegetation and he will have transesophageal echocardiogram today for further evaluation. Additionally, per infectious disease recommendation the patient will need 6 weeks of cefazolin and placement in skilled nursing facility per physical therapy.  1. Prosthetic joint infection Patient with left knee prosthetic joint infection. He has been followed by  infectious diseases and has failed treatment with Ancef, rifampin and cephalexin. He is currently status post excision arthroplasty and placement of antibiotic spacers. -- Continue cefazolin Will need 6 weeks -- Oxycodone immediate release 5-10 milligrams every 4 hours as needed for pain -- Zofran 4 mg every 6 hours as needed for nausea -- Blood cultures- no growth in 2 days  2. Acute kidney injury, resolved -- Most recent creatinine of 1.19 which is around the patient's baseline -- Hold lisinopril -- Hold hydrochlorothiazide -- Repeat basic metabolic panel tomorrow -- Maintenance IV fluids with normal saline at 75 mL an hour  3. Aortic valve vegetation Transthoracic echocardiogram from 05/30/2016 demonstrated a potential aortic valve vegetation. -- Further evaluation with transesophageal echocardiogram  4. Diabetes mellitus -- Correction factor insulin per protocol -- Insulin glargine 70 units once daily at night -- Carbohydrate modified diet  5. Hypertension -- Most recent blood pressure of 139/53 -- Hold lisinopril and hydrochlorothiazide in the setting of acute kidney injury  6. DVT/PE prophylaxis -- Lovenox per protocol   Dispo: Anticipated discharge in approximately 1-2 day(s).   Thomasene LotJames Davonte Siebenaler, MD 05/31/2016, 10:29 AM Pager: [phone]

## 2016-05-31 NOTE — Progress Notes (Signed)
[facility] has been requested to perform a transesophageal echocardiogram on 05/31/16 for bacteremia.  After careful review of history and examination, the risks and benefits of transesophageal echocardiogram have been explained including risks of esophageal damage, perforation (1:10,000 risk), bleeding, pharyngeal hematoma as well as other potential complications associated with conscious sedation including aspiration, arrhythmia, respiratory failure and death. Alternatives to treatment were discussed, questions were answered. Patient is willing to proceed.  TEE - Dr.Skains @ 15:00 . NPO after midnight. Meds with sips ok  Marc IshikawaErin E Kali Ambler, NP 05/31/2016 10:23 AM

## 2016-05-31 NOTE — Clinical Social Work Note (Signed)
Clinical Social Work Assessment  Patient Details  Name: Marc Vasquez MRN: [id number] Date of Birth: 1946-01-13  Date of referral:  05/31/16               Reason for consult:  Facility Placement                Permission sought to share information with:   (Facilities) Permission granted to share information::   (Facilities)  Name::        Agency::     Relationship::     Contact Information:     Housing/Transportation Living arrangements for the past 2 months:  Single Family Home (Patient lives at home with wife in [place]) Source of Information:  Patient, Spouse Patient Interpreter Needed:  None Criminal Activity/Legal Involvement Pertinent to Current Situation/Hospitalization:  No - Comment as needed Significant Relationships:  Spouse Lives with:  Spouse Do you feel safe going back to the place where you live?   (Patient is interested in SNF. ) Need for family participation in patient care:  Yes (Comment) (Wife is supportive)  Care giving concerns:  Patient states that he is from home with wife. Patient states that he lives at home with is wife in [place]. Patient states that prior to his knee surgery he was completing ADL's independently. However, he now states that he needs assistance.   Social Worker assessment / plan:  SW met with patient at bedside. Patient was alert and oriented. Wife was present. Patient states that he presents to [facility] due to knee surgery. Wife reports that the pt had his first knee replacement in 2014. Patient and wife states that this admission was the patient's 3rd surgery on knee. Patient states that if rehab is needed he will be accepting to go to rehab. Patient states his first choice is [facility]. SW will refer the pt to facilities.   Employment status:  Retired Forensic scientist:  Medicare PT Recommendations:  [place] / Referral to community resources:   (SW spoke with pt regarding facilities. He is accepting  with beign referred out.)  Patient/Family's Response to care:  Patient and wife are accepting at this time and agree for SW to refer him to facilities.  Patient/Family's Understanding of and Emotional Response to Diagnosis, Current Treatment, and Prognosis:  Patient and wife have no questions.  Emotional Assessment Appearance:  Appears stated age Attitude/Demeanor/Rapport:   (Appropriate.) Affect (typically observed):  Accepting, Appropriate Orientation:  Oriented to Self, Oriented to Place, Oriented to  Time, Oriented to Situation Alcohol / Substance use:  Not Applicable Psych involvement (Current and /or in the community):  No (Comment)  Discharge Needs  Concerns to be addressed:  No discharge needs identified Readmission within the last 30 days:  No Current discharge risk:    Barriers to Discharge:  No Barriers Identified   Venetia Maxon, Jovi Zavadil R 05/31/2016, 1:13 PM

## 2016-06-01 ENCOUNTER — Encounter (HOSPITAL_COMMUNITY): Payer: Self-pay

## 2016-06-01 LAB — BASIC METABOLIC PANEL
Anion gap: 7 (ref 5–15)
BUN: 20 mg/dL (ref 6–20)
CALCIUM: 8.5 mg/dL — AB (ref 8.9–10.3)
CHLORIDE: 106 mmol/L (ref 101–111)
CO2: 24 mmol/L (ref 22–32)
CREATININE: 1.17 mg/dL (ref 0.61–1.24)
GFR calc non Af Amer: >60 mL/min (ref >60)
GLUCOSE: 209 mg/dL — AB (ref 65–99)
Potassium: 3.4 mmol/L — ABNORMAL LOW (ref 3.5–5.1)
Sodium: 137 mmol/L (ref 135–145)

## 2016-06-01 LAB — GLUCOSE, CAPILLARY
GLUCOSE-CAPILLARY: 211 mg/dL — AB (ref 65–99)
GLUCOSE-CAPILLARY: 251 mg/dL — AB (ref 65–99)
Glucose-Capillary: 297 mg/dL — ABNORMAL HIGH (ref 65–99)

## 2016-06-01 LAB — MAGNESIUM: MAGNESIUM: 1.5 mg/dL — AB (ref 1.7–2.4)

## 2016-06-01 MED ORDER — METHOCARBAMOL 500 MG PO TABS: 500.0000 mg | ORAL_TABLET | Freq: Four times a day (QID) | ORAL | 0 refills | Status: DC | PRN

## 2016-06-01 MED ORDER — DIPHENHYDRAMINE HCL 12.5 MG/5ML PO ELIX: 12.5000 mg | ORAL_SOLUTION | ORAL | 0 refills | Status: DC | PRN

## 2016-06-01 MED ORDER — POTASSIUM CHLORIDE CRYS ER 20 MEQ PO TBCR
30.0000 meq | EXTENDED_RELEASE_TABLET | Freq: Two times a day (BID) | ORAL | Status: DC
  Administered 2016-06-01: 30 meq via ORAL
  Filled 2016-06-01: qty 1

## 2016-06-01 MED ORDER — CEFAZOLIN SODIUM-DEXTROSE 2-4 GM/100ML-% IV SOLN: 2.0000 g | Freq: Three times a day (TID) | INTRAVENOUS | 0 refills | Status: DC

## 2016-06-01 MED ORDER — CEFAZOLIN SODIUM-DEXTROSE 2-4 GM/100ML-% IV SOLN: 2.0000 g | Freq: Three times a day (TID) | INTRAVENOUS | 0 refills | Status: AC

## 2016-06-01 MED ORDER — HEPARIN SOD (PORK) LOCK FLUSH 100 UNIT/ML IV SOLN
250.0000 [IU] | INTRAVENOUS | Status: AC | PRN
  Administered 2016-06-01: 250 [IU]

## 2016-06-01 MED ORDER — OXYCODONE HCL 5 MG PO TABS: 5.0000 mg | ORAL_TABLET | ORAL | 0 refills | Status: DC | PRN

## 2016-06-01 MED ORDER — METOCLOPRAMIDE HCL 5 MG PO TABS: 5.0000 mg | ORAL_TABLET | Freq: Three times a day (TID) | ORAL | 0 refills | Status: DC | PRN

## 2016-06-01 NOTE — Clinical Social Work Placement (Signed)
   CLINICAL SOCIAL WORK PLACEMENT  NOTE  Date:  06/01/2016  Patient Details  Name: Marc Vasquez MRN: [id number] Date of Birth: 11/05/1945  Clinical Social Work is seeking post-discharge placement for this patient at the Skilled  Nursing Facility level of care (*CSW will initial, date and re-position this form in  chart as items are completed):  Yes   Patient/family provided with [place] Clinical Social Work Department's list of facilities offering this level of care within the geographic area requested by the patient (or if unable, by the patient's family).  Yes   Patient/family informed of their freedom to choose among providers that offer the needed level of care, that participate in Medicare, Medicaid or managed care program needed by the patient, have an available bed and are willing to accept the patient.  Yes   Patient/family informed of [facility]'s ownership interest in [facility] and [facility], as well as of the fact that they are under no obligation to receive care at these facilities.  PASRR submitted to EDS on       PASRR number received on       Existing PASRR number confirmed on       FL2 transmitted to all facilities in geographic area requested by pt/family on       FL2 transmitted to all facilities within larger geographic area on       Patient informed that his/her managed care company has contracts with or will negotiate with certain facilities, including the following:        Yes   Patient/family informed of bed offers received.  Patient chooses bed at  [facility])     Physician recommends and patient chooses bed at      Patient to be transferred to   on  .  Patient to be transferred to facility by       Patient family notified on   of transfer.  Name of family member notified:        PHYSICIAN       Additional Comment:    _______________________________________________ Alene MiresWhitaker, Anoushka Divito R 06/01/2016, 2:53 PM

## 2016-06-01 NOTE — Progress Notes (Signed)
Patient ID: Marc SprinklesMichael R Vasquez, male   DOB: 03/16/1946, 70 y.o.   MRN: [id number] No acute changes.  Left knee stable.  Can be discharge to skilled nursing from Ortho standpoint.

## 2016-06-01 NOTE — Progress Notes (Signed)
SW arranged transportation for patient with [facility] at 3:50pm to go to [facility]. SW gave nurse report number [phone] (ask for station 3).  Crista CurbBrittney Cebastian Neis, MSW [phone]

## 2016-06-01 NOTE — Discharge Instructions (Signed)
Ice as needed for left knee swelling. Can get current left knee dressing wet in the shower daily. Should leave this dressing on until his outpatient Ortho follow-up. Can put 50% of his weight on his left leg. Can bend his left knee as tolerated.

## 2016-06-01 NOTE — Progress Notes (Signed)
OT Cancellation Note  Patient Details Name: Marc Vasquez MRN: [id number] DOB: 1946/06/04   Cancelled Treatment:    Reason Eval/Treat Not Completed: Other (comment) (OT needs/evaluation can be met at next venue of care)  Jaci Carrel 06/01/2016, 2:35 PM  Hulda Humphrey OTR/L [phone]

## 2016-06-01 NOTE — Progress Notes (Signed)
Barbara of [facility] has offered the pt a bed. SW made pt and wife aware. They have accepted the facilities offer. SW made [facility] aware who states that the pt is welcomed to come today. SW made PA/Taylor aware.  Nurse Report Number: [phone]  Crista CurbBrittney Jera Headings, MSW [phone]

## 2016-06-01 NOTE — Discharge Summary (Signed)
Name: [name] MRN: [id number] DOB: [birth year] 70 y.o. PCP: Shelbie Ammons, MD  Date of Admission: 05/28/2016 12:03 AM Date of Discharge: 06/01/2016 Attending Physician: [name], MD  Discharge Diagnosis: 1. Prosthetic joint infection   Discharge Medications:   Medication List    STOP taking these medications   cephALEXin 500 MG capsule Commonly known as:  KEFLEX   diphenoxylate-atropine 2.5-0.025 MG/5ML liquid Commonly known as:  LOMOTIL   HYDROcodone-acetaminophen 10-325 MG tablet Commonly known as:  NORCO   magnesium oxide 400 MG tablet Commonly known as:  MAG-OX   rifampin 300 MG capsule Commonly known as:  RIFADIN     TAKE these medications   acetaminophen 325 MG tablet Commonly known as:  TYLENOL Take 650 mg by mouth every 6 (six) hours as needed (for breakthrough pain).   atorvastatin 80 MG tablet Commonly known as:  LIPITOR Take 80 mg by mouth daily.   ceFAZolin 2-4 GM/100ML-% IVPB Commonly known as:  ANCEF Inject 100 mLs (2 g total) into the vein every 8 (eight) hours.   clopidogrel 75 MG tablet Commonly known as:  PLAVIX Take 75 mg by mouth daily.   diphenhydrAMINE 12.5 MG/5ML elixir Commonly known as:  BENADRYL Take 5-10 mLs (12.5-25 mg total) by mouth every 4 (four) hours as needed for itching.   furosemide 40 MG tablet Commonly known as:  LASIX Take 80 mg by mouth daily.   HUMULIN R 500 UNIT/ML injection Generic drug:  insulin regular human CONCENTRATED Inject 20-130 Units into the skin See admin instructions. Use 120 to 130 units at breakfast, use 20-30 units at lunch then use 60 to 70 units at dinner   lisinopril-hydrochlorothiazide 10-12.5 MG tablet Commonly known as:  PRINZIDE,ZESTORETIC Take 1 tablet by mouth daily.   methocarbamol 500 MG tablet Commonly known as:  ROBAXIN Take 1 tablet (500 mg total) by mouth every 6 (six) hours as needed for muscle spasms.   metoCLOPramide 5 MG tablet Commonly known as:   REGLAN Take 1 tablet (5 mg total) by mouth every 8 (eight) hours as needed for nausea (if ondansetron (ZOFRAN) ineffective.).   multivitamin with minerals Tabs tablet Take 1 tablet by mouth daily.   omeprazole 40 MG capsule Commonly known as:  PRILOSEC Take 40 mg by mouth daily.   oxyCODONE 5 MG immediate release tablet Commonly known as:  Oxy IR/ROXICODONE Take 1-2 tablets (5-10 mg total) by mouth every 4 (four) hours as needed for breakthrough pain.   saccharomyces boulardii 250 MG capsule Commonly known as:  FLORASTOR Take 1 capsule (250 mg total) by mouth 2 (two) times daily.   tamsulosin 0.4 MG Caps capsule Commonly known as:  FLOMAX Take 0.4 mg by mouth daily.   Vitamin D3 2000 units Tabs Take 2,000 Units by mouth daily.       Disposition and follow-up:   [name] was discharged from [facility] in Good condition.  At the hospital follow up visit please address:  1.  Please ensure the patient continues his IV antibiotics. Please ensure the patient has follow-up with orthopedic surgery. Please ensure the patient has follow-up with infectious disease.  2.  Labs / imaging needed at time of follow-up: None  3.  Pending labs/ test needing follow-up: None  Follow-up Appointments: Follow-up Information    Kathryne Hitch, MD. Schedule an appointment as soon as possible for a visit in 2 week(s).   Specialty:  Orthopedic Surgery Contact information: [address]  [place] [address] [phone]           Hospital Course by problem list:  1. Left knee prosthetic joint infection Mr. [name] is a 70 year old male with a past medical history of diabetes mellitus, tobacco abuse, TKA in 2014, recent nephrolithiasis status post stent who presented to the [facility] emergency department on 05/28/2016 with worsening left knee pain with active left knee prosthetic joint infection. At the time of presentation the patient denied fevers,  chills, diarrhea , chest pain, abdominal pain or recent injury or falls. Importantly, in March of 2017 the patient was admitted to [facility] for incision and drainage of the left knee for prosthetic joint infection. Cultures from this procedure grew MSSA. He completed 8 weeks of IV cefazolin on 5/16 and has since been on Keflex and rifampin. Despite being on dual antimicrobial therapy the patient's pain continued to worsen causing him to present to the emergency department.  On admission to the emergency department the patient had a radiograph of the left knee which revealed a small joint effusion. Effusion was aspirated revealing yellow, cloudy fluid with 89,000 white blood cells. Gram stain performed on the fluid was negative for organisms but positive for abundant white blood cells. He was started on vancomycin and Zosyn in the emergency department. The following day he was taken to the operating room and he is currently status post excision arthroplasty and placement of antibiotic spacers. Infectious diseases was consult who recommended PICC line placement and a 6 week course of IV cefazolin. The patient recovered well after the surgery and remained afebrile and hemodynamically stable on service. Additionally, the patient was evaluated by physical therapy which recommended that he is only to bear 50% of weight on his left leg. At the time of discharge his left leg surgical incision was without erythema or signs of infection. His pain was well-controlled, he was afebrile and hemodynamically stable. He will need outpatient follow-up with surgery and infectious disease.  Antibiotic stop date will be 07/10/2016.  2. Potential aortic valve vegetation, ruled out At time of presentation to the emergency department blood cultures and echocardiogram were obtained to rule out bacteremia or infective endocarditis. The transthoracic echocardiogram, in a single view, revealed a potential aortic valve  vegetation. To further evaluate this finding a transesophageal echocardiogram was ordered and performed. Results of the transesophageal echocardiogram did not demonstrate valvular vegetation or other cardiac anomalies. Additionally, all blood cultures that were drawn have been no growth to date. Since admission the patient has been afebrile, without leukocytosis, with a normal transesophageal echocardiogram and with negative blood cultures making a diagnosis of bacteremia or infective endocarditis unlikely. He has been thoroughly worked up and the potential aortic valve vegetation has been ruled out at this time. At the time of discharge the patient was afebrile, hemodynamically stable, without leukocytosis and medically stable to go to skilled nursing facility.  Discharge Vitals:   BP (!) 151/56 (BP Location: Left Arm)   Pulse 82   Temp 99.1 F (37.3 C) (Oral)   Resp 18   Ht 5\' 11"  (1.803 m)   Wt (!) 305 lb (138.3 kg)   SpO2 97%   BMI 42.54 kg/m   Pertinent Labs, Studies, and Procedures:  1. Excision arthroplasty and placement of antibiotic spacers 2. Transthoracic echocardiogram demonstrating a potential aortic valve vegetation 3. Transesophageal echocardiogram ruling out the potential for a valve vegetation 4. Blood culture showing no growth to date  Discharge Instructions: Discharge Instructions  Diet - low sodium heart healthy    Complete by:  As directed   Discharge instructions    Complete by:  As directed   Please continue to take her antibiotics as prescribed. Please ensure you follow up with your orthopedic surgeries. Please ensure you follow up with infectious disease in the outpatient setting.   Increase activity slowly    Complete by:  As directed      Signed: Thomasene Lot, MD 06/01/2016, 2:12 PM   Pager: [phone]

## 2016-06-01 NOTE — Progress Notes (Signed)
Subjective: Postop day 4, afebrile, hemodynamically stable. No acute events overnight. Transesophageal echocardiogram did not show evidence of vegetation. Blood cultures continued to show no growth to date. Patient says his pain is well-controlled. He denies nausea, vomiting or abdominal pain. He has no additional acute complaints or concerns this morning. He will be appropriate for discharge from medical standpoint today.  Objective:  Vital signs in last 24 hours: Vitals:   05/31/16 1538 05/31/16 1548 05/31/16 2020 06/01/16 0619  BP: (!) 158/68 (!) 161/72 (!) 126/56 (!) 151/56  Pulse: 81  86 82  Resp: 15 (!) 22 18 18   Temp:   99.1 F (37.3 C)   TempSrc:   Oral   SpO2: 94% 95% 96% 97%  Weight:      Height:       Physical Exam  Constitutional: He is oriented to person, place, and time. He appears well-developed and well-nourished.  Extremely pleasant male, obese, in no acute distress  HENT:  Head: Normocephalic and atraumatic.  Cardiovascular: Normal rate and regular rhythm.  Exam reveals no gallop and no friction rub.   No murmur heard. Respiratory: Effort normal and breath sounds normal. No respiratory distress. He has no wheezes.  GI: Soft. Bowel sounds are normal. He exhibits no distension. There is no tenderness.  Musculoskeletal: He exhibits edema.  Positive edema on the right lower extremity, left lower extremity is extremely swollen compared to the right. There is a dressing over the surgical incision. There is mild warmness upon palpation around surgical incision. There is no evidence of erythema or other signs of infection.  Neurological: He is alert and oriented to person, place, and time.     Assessment/Plan: Marc Vasquez is a 70 year old male with a past medical history of diabetes mellitus, tobacco abuse, TKA in 2014, recent nephrolithiasis status post stent who presented to the [facility] emergency department with worsening left knee pain for the past 48 hours secondary  to septic left knee. He is currently status post excision arthroplasty and placement of antibiotic spacer.   In summary, patient is postop day 4, hemodynamically stable and afebrile. He had a transesophageal echocardiogram on 05/31/2016 which did not show evidence of vegetation. Blood cultures are no growth to date. He is appropriate for discharge from medical standpoint today.  1.Prosthetic joint infection Patient with left knee prosthetic joint infection. He has been followed by infectious diseases and has failed treatment with Ancef, rifampin and cephalexin. He is currently status post excision arthroplasty and placement of antibiotic spacers. -- Continue cefazolin Will need 6 weeks -- Follow-up with infectious diseases in the outpatient setting -- Oxycodone immediate release 5-10 milligrams every 4 hours as needed for pain -- Zofran 4 mg every 6 hours as needed for nausea -- Blood cultures- NGTD  2. Acute kidney injury, resolved -- Most recent creatinine of 1.17 which is around the patient's baseline -- Restart lisinopril at time of discharge  3. Aortic valve vegetation, ruled out Transthoracic echocardiogram from 05/30/2016 demonstrated a potential aortic valve vegetation. -- Patient had transesophageal echocardiogram on 05/31/2016 which was negative for aortic valve vegetation or other pathology  4. Diabetes mellitus -- Correction factor insulin per protocol -- Insulin glargine 70 units once daily at night -- Carbohydrate modified diet  5. Hypertension -- Most recent blood pressure of 151/56 -- Restart lisinopril upon discharge  6. DVT/PE prophylaxis -- Lovenox per protocol  Dispo: Appropriate for discharge today pending placement.   Marc LotJames Brennen Camper, MD 06/01/2016, 11:21 AM Pager: [phone]

## 2016-06-02 LAB — CULTURE, BLOOD (ROUTINE X 2)
CULTURE: NO GROWTH
CULTURE: NO GROWTH
CULTURE: NO GROWTH
CULTURE: NO GROWTH

## 2016-06-16 ENCOUNTER — Telehealth: Payer: Self-pay

## 2016-06-16 NOTE — Telephone Encounter (Signed)
Beth with [facility] in [place] is requesting a script for patients discharge to home.  The plan is for him to discharge on Saturday of this week.   The patient is not scheduled to see Dr Drue SecondSnider until October, 2017.  Please advise .

## 2016-06-16 NOTE — Telephone Encounter (Signed)
Orders completed and faxed to [facility]- [place] .  Fax : [phone] Copy available in triage.    Phone : [phone] ext [address]     Tammy K King, [place]

## 2016-06-22 ENCOUNTER — Other Ambulatory Visit: Payer: Self-pay | Admitting: Internal Medicine

## 2016-06-22 DIAGNOSIS — T8450XD Infection and inflammatory reaction due to unspecified internal joint prosthesis, subsequent encounter: Secondary | ICD-10-CM

## 2016-07-07 ENCOUNTER — Telehealth: Payer: Self-pay | Admitting: *Deleted

## 2016-07-07 NOTE — Telephone Encounter (Signed)
Patient's wife called. He is to finish IV antibiotics 10/6, [facility]. Patient was previously transitioned to oral antibiotics after IV, she is asking if he should be on oral antibiotics this time as well. If so, please send to [facility] in [place] (pharmacy on profile). Additionally, she is asking if he is on track lab-wise.  Patient scheduled for follow up next with with Dr. Drue SecondSnider. Please advise.  Andree CossHowell, Elby Blackwelder M, RN

## 2016-07-08 NOTE — Telephone Encounter (Signed)
Called his wife back and let her know to cont with the current plan. She is to f/u with Dr. Drue SecondSnider next week.

## 2016-07-13 ENCOUNTER — Telehealth: Payer: Self-pay | Admitting: *Deleted

## 2016-07-13 NOTE — Telephone Encounter (Signed)
Call from patient's wife stating the front of his leg is reddish purple (states this in unchanged), however he has swelling of both feet and the skin is peeling off with a sore spot about the size of a "50 cent piece". She wants to know if an oral antibiotic should be given. She is worried about it worsening prior to his appt with Dr. Drue SecondSnider on 07/15/16. Advised I would speak to Dr. Drue SecondSnider and return her call this afternoon. Please advise

## 2016-07-13 NOTE — Telephone Encounter (Signed)
Patients wife notified

## 2016-07-13 NOTE — Telephone Encounter (Signed)
Can you ask them to take one addn dose of lasix today and tomorrow to see if swelling improves.

## 2016-07-15 ENCOUNTER — Ambulatory Visit (INDEPENDENT_AMBULATORY_CARE_PROVIDER_SITE_OTHER): Payer: Medicare Other | Admitting: Internal Medicine

## 2016-07-15 VITALS — Temp 98.6°F

## 2016-07-15 DIAGNOSIS — D72823 Leukemoid reaction: Secondary | ICD-10-CM

## 2016-07-15 DIAGNOSIS — Z23 Encounter for immunization: Secondary | ICD-10-CM

## 2016-07-15 DIAGNOSIS — R7881 Bacteremia: Secondary | ICD-10-CM

## 2016-07-15 DIAGNOSIS — M1712 Unilateral primary osteoarthritis, left knee: Secondary | ICD-10-CM | POA: Diagnosis not present

## 2016-07-15 DIAGNOSIS — M779 Enthesopathy, unspecified: Secondary | ICD-10-CM | POA: Diagnosis not present

## 2016-07-15 DIAGNOSIS — A4901 Methicillin susceptible Staphylococcus aureus infection, unspecified site: Secondary | ICD-10-CM | POA: Diagnosis not present

## 2016-07-15 DIAGNOSIS — T8450XS Infection and inflammatory reaction due to unspecified internal joint prosthesis, sequela: Secondary | ICD-10-CM

## 2016-07-15 LAB — CBC WITH DIFFERENTIAL/PLATELET
BASOS PCT: 1 %
Basophils Absolute: 83 cells/uL (ref 0–200)
EOS PCT: 4 %
Eosinophils Absolute: 332 cells/uL (ref 15–500)
HCT: 33.9 % — ABNORMAL LOW (ref 38.5–50.0)
Hemoglobin: 10.7 g/dL — ABNORMAL LOW (ref 13.2–17.1)
LYMPHS PCT: 21 %
Lymphs Abs: 1743 cells/uL (ref 850–3900)
MCH: 28.2 pg (ref 27.0–33.0)
MCHC: 31.6 g/dL — ABNORMAL LOW (ref 32.0–36.0)
MCV: 89.2 fL (ref 80.0–100.0)
MONOS PCT: 8 %
MPV: 8.5 fL (ref 7.5–12.5)
Monocytes Absolute: 664 cells/uL (ref 200–950)
Neutro Abs: 5478 cells/uL (ref 1500–7800)
Neutrophils Relative %: 66 %
PLATELETS: 257 10*3/uL (ref 140–400)
RBC: 3.8 MIL/uL — AB (ref 4.20–5.80)
RDW: 14.1 % (ref 11.0–15.0)
WBC: 8.3 10*3/uL (ref 3.8–10.8)

## 2016-07-15 LAB — BASIC METABOLIC PANEL
BUN: 32 mg/dL — AB (ref 7–25)
CO2: 24 mmol/L (ref 20–31)
CREATININE: 1.36 mg/dL — AB (ref 0.70–1.18)
Calcium: 9.1 mg/dL (ref 8.6–10.3)
Chloride: 105 mmol/L (ref 98–110)
Glucose, Bld: 121 mg/dL — ABNORMAL HIGH (ref 65–99)
POTASSIUM: 4.4 mmol/L (ref 3.5–5.3)
Sodium: 141 mmol/L (ref 135–146)

## 2016-07-15 NOTE — Progress Notes (Signed)
Rfv: follow upon pji  Patient ID: Marc Vasquez, male   DOB: [birth year], 70 y.o.   MRN: [id number]  HPI 70yo M who had recent  MSSA infection prosthetic joint infection but recently underwent  Removal of all components of left total knee with abtx spacer on 8/25, repeat cx grew MSSA. HE is now dischargd on cefazolin 2gm IV Q 8hr which finished on oc 6th. He has difficulty with weightbearing on left knee abtx spacer. Tolerates only 5 min. Sees dr. Magnus Ivan for follow up Outpatient Encounter Prescriptions as of 07/15/2016  Medication Sig  . acetaminophen (TYLENOL) 325 MG tablet Take 650 mg by mouth every 6 (six) hours as needed (for breakthrough pain).  Marland Kitchen atorvastatin (LIPITOR) 80 MG tablet Take 80 mg by mouth daily.  . Cholecalciferol (VITAMIN D3) 2000 units TABS Take 2,000 Units by mouth daily.   . clopidogrel (PLAVIX) 75 MG tablet Take 75 mg by mouth daily.  . diphenhydrAMINE (BENADRYL) 12.5 MG/5ML elixir Take 5-10 mLs (12.5-25 mg total) by mouth every 4 (four) hours as needed for itching.  . furosemide (LASIX) 40 MG tablet Take 80 mg by mouth daily.   . insulin regular human CONCENTRATED (HUMULIN R) 500 UNIT/ML injection Inject 20-130 Units into the skin See admin instructions. Use 120 to 130 units at breakfast, use 20-30 units at lunch then use 60 to 70 units at dinner  . lisinopril-hydrochlorothiazide (PRINZIDE,ZESTORETIC) 10-12.5 MG tablet Take 1 tablet by mouth daily.  . methocarbamol (ROBAXIN) 500 MG tablet Take 1 tablet (500 mg total) by mouth every 6 (six) hours as needed for muscle spasms.  . metoCLOPramide (REGLAN) 5 MG tablet Take 1 tablet (5 mg total) by mouth every 8 (eight) hours as needed for nausea (if ondansetron (ZOFRAN) ineffective.).  Marland Kitchen Multiple Vitamin (MULTIVITAMIN WITH MINERALS) TABS tablet Take 1 tablet by mouth daily.  Marland Kitchen omeprazole (PRILOSEC) 40 MG capsule Take 40 mg by mouth daily.  Marland Kitchen oxyCODONE (OXY IR/ROXICODONE) 5 MG immediate release tablet Take 1-2 tablets  (5-10 mg total) by mouth every 4 (four) hours as needed for breakthrough pain.  Marland Kitchen saccharomyces boulardii (FLORASTOR) 250 MG capsule Take 1 capsule (250 mg total) by mouth 2 (two) times daily.  . tamsulosin (FLOMAX) 0.4 MG CAPS capsule Take 0.4 mg by mouth daily.   No facility-administered encounter medications on file as of 07/15/2016.      Patient Active Problem List   Diagnosis Date Noted  . Septic arthritis (HCC) 05/28/2016  . Staph aureus infection 01/07/2016  . Infection of prosthetic left knee joint (HCC) 01/07/2016  . History of arthroplasty of left knee 01/07/2016  . Former smoker 01/07/2016  . Poorly controlled type 2 diabetes mellitus (HCC) 01/07/2016  . History of CVA (cerebrovascular accident) 01/07/2016  . Diabetic neuropathy (HCC) 01/07/2016  . Essential hypertension 01/07/2016     Health Maintenance Due  Topic Date Due  . HEMOGLOBIN A1C  01/08/1946  . Hepatitis C Screening  09/17/1946  . FOOT EXAM  07/08/1955  . OPHTHALMOLOGY EXAM  07/08/1955  . TETANUS/TDAP  07/07/1964  . COLONOSCOPY  07/08/1995  . ZOSTAVAX  07/07/2005  . PNA vac Low Risk Adult (1 of 2 - PCV13) 07/07/2010  . INFLUENZA VACCINE  05/04/2016     Review of Systems  Physical Exam   Physical Exam  Constitutional: He is oriented to person, place, and time. He appears well-developed and well-nourished. No distress.  HENT:  Mouth/Throat: Oropharynx is clear and moist. No oropharyngeal exudate.  Cardiovascular: Normal rate,  regular rhythm and normal heart sounds. Exam reveals no gallop and no friction rub.  No murmur heard.  Pulmonary/Chest: Effort normal and breath sounds normal. No respiratory distress. He has no wheezes.  Abdominal: Soft. Bowel sounds are normal. He exhibits no distension. There is no tenderness.  Lymphadenopathy:  He has no cervical adenopathy.  Ext: left knee warm to touch  swollen but no erythema or fluctuance, incision is well healed Neurological: He is alert and oriented  to person, place, and time.  Skin: Skin is warm and dry. No rash noted. No erythema.  Psychiatric: He has a normal mood and affect. His behavior is normal.    CBC Lab Results  Component Value Date   WBC 7.4 05/31/2016   RBC 2.95 (L) 05/31/2016   HGB 8.7 (L) 05/31/2016   HCT 26.2 (L) 05/31/2016   PLT 196 05/31/2016   MCV 88.8 05/31/2016   MCH 29.5 05/31/2016   MCHC 33.2 05/31/2016   RDW 13.6 05/31/2016   LYMPHSABS 1.2 05/28/2016   MONOABS 1.1 (H) 05/28/2016   EOSABS 0.0 05/28/2016   BASOSABS 0.0 05/28/2016   BMET Lab Results  Component Value Date   NA 137 06/01/2016   K 3.4 (L) 06/01/2016   CL 106 06/01/2016   CO2 24 06/01/2016   GLUCOSE 209 (H) 06/01/2016   BUN 20 06/01/2016   CREATININE 1.17 06/01/2016   CALCIUM 8.5 (L) 06/01/2016   GFRNONAA >60 06/01/2016   GFRAA >60 06/01/2016   Lab Results  Component Value Date   CREATININE 1.17 06/01/2016   BUN 20 06/01/2016   NA 137 06/01/2016   K 3.4 (L) 06/01/2016   CL 106 06/01/2016   CO2 24 06/01/2016    Lab Results  Component Value Date   ESRSEDRATE 100 (H) 05/29/2016   Lab Results  Component Value Date   CRP 25.6 (H) 05/29/2016   Lab Results  Component Value Date   ESRSEDRATE 71 (H) 07/15/2016   Lab Results  Component Value Date   CRP 25.6 (H) 05/29/2016     Assessment and Plan  MSSA prosthetic joint infection = he has finished his 6 wk course of IV abtx but do not have recent inflammatory markers to see that it is trending down. Will reach out to dr blackman to see if he is willing to tap prosthesis to see if still has signs of infection  - will get cbc, bmp, sed rate and crp  May need to repeat course of antibiotics if inflammatory markers trend up. Will ask to repeat this week off of abtx

## 2016-07-16 LAB — SEDIMENTATION RATE: Sed Rate: 71 mm/hr — ABNORMAL HIGH (ref 0–20)

## 2016-07-21 ENCOUNTER — Ambulatory Visit (INDEPENDENT_AMBULATORY_CARE_PROVIDER_SITE_OTHER): Payer: Medicare Other | Admitting: Orthopaedic Surgery

## 2016-07-21 DIAGNOSIS — T8454XD Infection and inflammatory reaction due to internal left knee prosthesis, subsequent encounter: Secondary | ICD-10-CM | POA: Diagnosis not present

## 2016-07-28 ENCOUNTER — Encounter (INDEPENDENT_AMBULATORY_CARE_PROVIDER_SITE_OTHER): Payer: Self-pay | Admitting: Orthopaedic Surgery

## 2016-07-28 ENCOUNTER — Ambulatory Visit (INDEPENDENT_AMBULATORY_CARE_PROVIDER_SITE_OTHER): Payer: Medicare Other | Admitting: Orthopaedic Surgery

## 2016-07-28 DIAGNOSIS — Z96652 Presence of left artificial knee joint: Secondary | ICD-10-CM

## 2016-07-28 DIAGNOSIS — T8454XD Infection and inflammatory reaction due to internal left knee prosthesis, subsequent encounter: Secondary | ICD-10-CM

## 2016-07-28 NOTE — Progress Notes (Signed)
Office Visit Note   Patient: Marc Vasquez           Date of Birth: 09/17/1946           MRN: [id number] Visit Date: 07/28/2016              Requested by: Raelyn Number, MD [address] [place], [place] [address] PCP: Bonnita Nasuti, MD   Assessment & Plan: Visit Diagnoses:  1. Infection of total left knee replacement, subsequent encounter     Plan:  1)  Will plan on proceeding to surgery for removal of the antibiotic space and possible revision of the total knee as intra-op findings dictate.  His infectious labs are trending down.  He has been off of antibiotics for 2 weeks.  He understands fully the risks and benefits of surgery and that he may require just a repeat I&D and new spacer as well as another course of IV antibiotcs.  Hopefully we will be able to proceed with a revision knee replacement.  Follow-Up Instructions: Return for 2 weeks post-op.   Orders:  No orders of the defined types were placed in this encounter.  No orders of the defined types were placed in this encounter.     Procedures: No procedures performed   Clinical Data: No additional findings.   Subjective: Chief Complaint  Patient presents with  . Left Knee - Routine Post Op, Follow-up    9 WEEKS PO    Status post excision arthroplasty of a left total knee. SU Date: 05/28/16. Pt ambulates with wheelchair. NWB. Pain level 7-8/10. Takes Tylenol for pain. Also takes Hydrocodone when pain is severe. He has some popping in the Left Knee. Stopped home health PT bc of the popping pt states. He would like to review blood work today.     Review of Systems   Objective: Vital Signs: There were no vitals taken for this visit.  Physical Exam  Ortho Exam  Left knee has well-healed incision with minimal swelling WBC from aspiration 2,200 ESR down to 65, Cr-P 5 and WBC 9    Specialty Comments:  No specialty comments available.  Imaging: No results found.   PMFS History: Patient  Active Problem List   Diagnosis Date Noted  . Septic arthritis ([place]) 05/28/2016  . Staph aureus infection 01/07/2016  . Infection of prosthetic left knee joint ([place]) 01/07/2016  . History of arthroplasty of left knee 01/07/2016  . Former smoker 01/07/2016  . Poorly controlled type 2 diabetes mellitus ([place]) 01/07/2016  . History of CVA (cerebrovascular accident) 01/07/2016  . Diabetic neuropathy ([place]) 01/07/2016  . Essential hypertension 01/07/2016   Past Medical History:  Diagnosis Date  . Depression   . Diabetic neuropathy associated with diabetes mellitus due to underlying condition ([place])   . Former smoker quit on 12/15/15  . Generalized OA   . History of left knee replacement   . History of stroke   . Hypercholesteremia   . Infection of prosthetic left knee joint ([place])   . Left hip pain   . Lumbago   . OSA (obstructive sleep apnea)   . Poorly controlled diabetes mellitus ([place])     Family History  Problem Relation Age of Onset  . Heart disease    . Diabetes      Past Surgical History:  Procedure Laterality Date  . TEE WITHOUT CARDIOVERSION N/A 05/31/2016   Procedure: TRANSESOPHAGEAL ECHOCARDIOGRAM (TEE);  Surgeon: Jerline Pain, MD;  Location: [place];  Service: Cardiovascular;  Laterality: N/A;  . TOTAL KNEE REVISION Left 05/28/2016   Procedure: REMOVAL OF ALL COMPONENTS OF LEFT TOTAL KNEE AND PLACEMENT OF ANTIBIOTIC SPACER;  Surgeon: Mcarthur Rossetti, MD;  Location: [place];  Service: Orthopedics;  Laterality: Left;   Social History   Occupational History  . retired Psychologist, sport and exercise   . former veteran    Social History Main Topics  . Smoking status: Former Smoker    Packs/day: 1.00    Years: 15.00    Types: Cigarettes    Quit date: 12/15/2015  . Smokeless tobacco: Never Used  . Alcohol use 0.6 oz/week    1 Standard drinks or equivalent per week     Comment: rarely  . Drug use: No  . Sexual activity: Yes    Partners: Female     Comment: married , lives with his  wife

## 2016-08-05 NOTE — Progress Notes (Signed)
Need orders in epic for 08-27-16 surgery

## 2016-08-10 ENCOUNTER — Other Ambulatory Visit (INDEPENDENT_AMBULATORY_CARE_PROVIDER_SITE_OTHER): Payer: Self-pay | Admitting: Physician Assistant

## 2016-08-19 ENCOUNTER — Encounter (HOSPITAL_COMMUNITY): Payer: Self-pay

## 2016-08-20 ENCOUNTER — Encounter (HOSPITAL_COMMUNITY)
Admission: RE | Admit: 2016-08-20 | Discharge: 2016-08-20 | Disposition: A | Discharge status: Home / Self Care | Payer: Medicare Other | Source: Ambulatory Visit | Attending: Orthopaedic Surgery | Admitting: Orthopaedic Surgery

## 2016-08-20 ENCOUNTER — Encounter (HOSPITAL_COMMUNITY): Payer: Self-pay

## 2016-08-20 DIAGNOSIS — Z01818 Encounter for other preprocedural examination: Secondary | ICD-10-CM | POA: Insufficient documentation

## 2016-08-20 HISTORY — DX: Chronic kidney disease, unspecified: N18.9

## 2016-08-20 HISTORY — DX: Scarlet fever, uncomplicated: A38.9

## 2016-08-20 HISTORY — DX: Pneumonia, unspecified organism: J18.9

## 2016-08-20 LAB — GLUCOSE, CAPILLARY
Glucose-Capillary: 57 mg/dL — ABNORMAL LOW (ref 65–99)
Glucose-Capillary: 94 mg/dL (ref 65–99)

## 2016-08-20 LAB — BASIC METABOLIC PANEL
ANION GAP: 10 (ref 5–15)
BUN: 49 mg/dL — AB (ref 6–20)
CO2: 24 mmol/L (ref 22–32)
Calcium: 9.3 mg/dL (ref 8.9–10.3)
Chloride: 105 mmol/L (ref 101–111)
Creatinine, Ser: 1.44 mg/dL — ABNORMAL HIGH (ref 0.61–1.24)
GFR, EST AFRICAN AMERICAN: 55 mL/min — AB (ref >60)
GFR, EST NON AFRICAN AMERICAN: 47 mL/min — AB (ref >60)
Glucose, Bld: 86 mg/dL (ref 65–99)
POTASSIUM: 3.9 mmol/L (ref 3.5–5.1)
SODIUM: 139 mmol/L (ref 135–145)

## 2016-08-20 LAB — CBC
HEMATOCRIT: 33.9 % — AB (ref 39.0–52.0)
Hemoglobin: 11.2 g/dL — ABNORMAL LOW (ref 13.0–17.0)
MCH: 28.8 pg (ref 26.0–34.0)
MCHC: 33 g/dL (ref 30.0–36.0)
MCV: 87.1 fL (ref 78.0–100.0)
Platelets: 259 10*3/uL (ref 150–400)
RBC: 3.89 MIL/uL — AB (ref 4.22–5.81)
RDW: 13.7 % (ref 11.5–15.5)
WBC: 10.5 10*3/uL (ref 4.0–10.5)

## 2016-08-20 LAB — SURGICAL PCR SCREEN
MRSA, PCR: NEGATIVE
STAPHYLOCOCCUS AUREUS: NEGATIVE

## 2016-08-20 LAB — ABO/RH: ABO/RH(D): O POS

## 2016-08-20 NOTE — Patient Instructions (Addendum)
Marc Vasquez  08/20/2016   Your procedure is scheduled on: 08/27/16  Report to [facility]  Entrance take [facility]  elevators to 3rd floor to  Short Stay Center at 5:30 AM.  Call this number if you have problems the morning of surgery [phone]   Remember: ONLY 1 PERSON MAY GO WITH YOU TO SHORT STAY TO GET  READY MORNING OF YOUR SURGERY.  Do not eat food or drink liquids :After Midnight.     Take these medicines the morning of surgery with A SIP OF WATER: Amlodipine, hydrocodone-acetaminophen if needed, eye drops if needed.  How to Manage Your Diabetes Before and After Surgery  Why is it important to control my blood sugar before and after surgery? . Improving blood sugar levels before and after surgery helps healing and can limit problems. . A way of improving blood sugar control is eating a healthy diet by: o  Eating less sugar and carbohydrates o  Increasing activity/exercise o  Talking with your doctor about reaching your blood sugar goals . High blood sugars (greater than 180 mg/dL) can raise your risk of infections and slow your recovery, so you will need to focus on controlling your diabetes during the weeks before surgery. . Make sure that the doctor who takes care of your diabetes knows about your planned surgery including the date and location.  How do I manage my blood sugar before surgery? . Check your blood sugar at least 4 times a day, starting 2 days before surgery, to make sure that the level is not too high or low. o Check your blood sugar the morning of your surgery when you wake up and every 2 hours until you get to the Short Stay unit. . If your blood sugar is less than 70 mg/dL, you will need to treat for low blood sugar: o Do not take insulin. o Treat a low blood sugar (less than 70 mg/dL) with  cup of clear juice (cranberry or apple), 4 glucose tablets, OR glucose gel. o Recheck blood sugar in 15 minutes after treatment (to make  sure it is greater than 70 mg/dL). If your blood sugar is not greater than 70 mg/dL on recheck, call [phone] for further instructions. . Report your blood sugar to the short stay nurse when you get to Short Stay.  . If you are admitted to the hospital after surgery: o Your blood sugar will be checked by the staff and you will probably be given insulin after surgery (instead of oral diabetes medicines) to make sure you have good blood sugar levels. o The goal for blood sugar control after surgery is 80-180 mg/dL.   WHAT DO I DO ABOUT MY DIABETES MEDICATION?  Marland Kitchen The day before surgery:  take your usual does of Humulin R before meals. . Morning of surgery:  Do not take oral diabetes medicines (pills) the morning of surgery. . Morning of surgery:  If CBG is greater than 220 mg/dL, you may take half of your usual correction dose.   . The day of surgery, do not take other diabetes injectables, including Byetta (exenatide), Bydureon (exenatide ER), Victoza (liraglutide), or Trulicity (dulaglutide).     For patients with insulin pumps: Contact your diabetes doctor for specific instructions before surgery. Decrease basal rates by 20% at midnight the night before your surgery. Note that if your surgery is planned to be longer than 2  hours, your insulin pump will be removed and intravenous (IV) insulin will be started and managed by the nurses and the anesthesiologist. You will be able to restart your insulin pump once you are awake and able to manage it.  Make sure to bring insulin pump supplies to the hospital with you in case the  site needs to be changed.  Patient Signature:  Date:   Nurse Signature:  Date:   Reviewed and Endorsed by [facility] Patient Education Committee, August 2015                               You may not have any metal on your body including hair pins and              piercings  Do not wear jewelry, make-up, lotions, powders or perfumes, deodorant             Do  not wear nail polish.  Do not shave  48 hours prior to surgery.              Men may shave face and neck.   Do not bring valuables to the hospital. [facility] IS NOT             RESPONSIBLE   FOR VALUABLES.  Contacts, dentures or bridgework may not be worn into surgery.  Leave suitcase in the car. After surgery it may be brought to your room.               Please read over the following fact sheets you were given: _____________________________________________________________________             [facility] - Preparing for Surgery Before surgery, you can play an important role.  Because skin is not sterile, your skin needs to be as free of germs as possible.  You can reduce the number of germs on your skin by washing with CHG (chlorahexidine gluconate) soap before surgery.  CHG is an antiseptic cleaner which kills germs and bonds with the skin to continue killing germs even after washing. Please DO NOT use if you have an allergy to CHG or antibacterial soaps.  If your skin becomes reddened/irritated stop using the CHG and inform your nurse when you arrive at Short Stay. Do not shave (including legs and underarms) for at least 48 hours prior to the first CHG shower.  You may shave your face/neck. Please follow these instructions carefully:  1.  Shower with CHG Soap the night before surgery and the  morning of Surgery.  2.  If you choose to wash your hair, wash your hair first as usual with your  normal  shampoo.  3.  After you shampoo, rinse your hair and body thoroughly to remove the  shampoo.                           4.  Use CHG as you would any other liquid soap.  You can apply chg directly  to the skin and wash                       Gently with a scrungie or clean washcloth.  5.  Apply the CHG Soap to your body ONLY FROM THE NECK DOWN.   Do not use on face/ open  Wound or open sores. Avoid contact with eyes, ears mouth and genitals (private parts).                        Wash face,  Genitals (private parts) with your normal soap.             6.  Wash thoroughly, paying special attention to the area where your surgery  will be performed.  7.  Thoroughly rinse your body with warm water from the neck down.  8.  DO NOT shower/wash with your normal soap after using and rinsing off  the CHG Soap.                9.  Pat yourself dry with a clean towel.            10.  Wear clean pajamas.            11.  Place clean sheets on your bed the night of your first shower and do not  sleep with pets. Day of Surgery : Do not apply any lotions/deodorants the morning of surgery.  Please wear clean clothes to the hospital/surgery center.  FAILURE TO FOLLOW THESE INSTRUCTIONS MAY RESULT IN THE CANCELLATION OF YOUR SURGERY PATIENT SIGNATURE_________________________________  NURSE SIGNATURE__________________________________  ________________________________________________________________________   Marc MireIncentive Spirometer  An incentive spirometer is a tool that can help keep your lungs clear and active. This tool measures how well you are filling your lungs with each breath. Taking long deep breaths may help reverse or decrease the chance of developing breathing (pulmonary) problems (especially infection) following:  A long period of time when you are unable to move or be active. BEFORE THE PROCEDURE   If the spirometer includes an indicator to show your best effort, your nurse or respiratory therapist will set it to a desired goal.  If possible, sit up straight or lean slightly forward. Try not to slouch.  Hold the incentive spirometer in an upright position. INSTRUCTIONS FOR USE  1. Sit on the edge of your bed if possible, or sit up as far as you can in bed or on a chair. 2. Hold the incentive spirometer in an upright position. 3. Breathe out normally. 4. Place the mouthpiece in your mouth and seal your lips tightly around it. 5. Breathe in slowly and as deeply  as possible, raising the piston or the ball toward the top of the column. 6. Hold your breath for 3-5 seconds or for as long as possible. Allow the piston or ball to fall to the bottom of the column. 7. Remove the mouthpiece from your mouth and breathe out normally. 8. Rest for a few seconds and repeat Steps 1 through 7 at least 10 times every 1-2 hours when you are awake. Take your time and take a few normal breaths between deep breaths. 9. The spirometer may include an indicator to show your best effort. Use the indicator as a goal to work toward during each repetition. 10. After each set of 10 deep breaths, practice coughing to be sure your lungs are clear. If you have an incision (the cut made at the time of surgery), support your incision when coughing by placing a pillow or rolled up towels firmly against it. Once you are able to get out of bed, walk around indoors and cough well. You may stop using the incentive spirometer when instructed by your caregiver.  RISKS AND COMPLICATIONS  Take your time so you do not  get dizzy or light-headed.  If you are in pain, you may need to take or ask for pain medication before doing incentive spirometry. It is harder to take a deep breath if you are having pain. AFTER USE  Rest and breathe slowly and easily.  It can be helpful to keep track of a log of your progress. Your caregiver can provide you with a simple table to help with this. If you are using the spirometer at home, follow these instructions: SEEK MEDICAL CARE IF:   You are having difficultly using the spirometer.  You have trouble using the spirometer as often as instructed.  Your pain medication is not giving enough relief while using the spirometer.  You develop fever of 100.5 F (38.1 C) or higher. SEEK IMMEDIATE MEDICAL CARE IF:   You cough up bloody sputum that had not been present before.  You develop fever of 102 F (38.9 C) or greater.  You develop worsening pain at or  near the incision site. MAKE SURE YOU:   Understand these instructions.  Will watch your condition.  Will get help right away if you are not doing well or get worse. Document Released: 01/31/2007 Document Revised: 12/13/2011 Document Reviewed: 04/03/2007 [facility] Patient Information 2014 [place], [place].   ________________________________________________________________________

## 2016-08-20 NOTE — Pre-Procedure Instructions (Signed)
EKG 05-30-16 epic TEE 05-31-16 epic CXR 05-28-16 epic HgbA1C 08-09-16 on chart

## 2016-08-26 ENCOUNTER — Encounter (HOSPITAL_COMMUNITY): Payer: Self-pay | Admitting: Anesthesiology

## 2016-08-26 MED ORDER — DEXTROSE 5 % IV SOLN
3.0000 g | INTRAVENOUS | Status: AC
  Administered 2016-08-27: 3 g via INTRAVENOUS
  Filled 2016-08-26: qty 3
  Filled 2016-08-26: qty 3000

## 2016-08-26 NOTE — Anesthesia Preprocedure Evaluation (Addendum)
Anesthesia Evaluation  Patient identified by MRN, date of birth, ID band Patient awake    Reviewed: Allergy & Precautions, NPO status , Patient's Chart, lab work & pertinent test results, reviewed documented beta blocker date and time   Airway Mallampati: III  TM Distance: >3 FB Neck ROM: Full    Dental  (+) Teeth Intact   Pulmonary sleep apnea and Continuous Positive Airway Pressure Ventilation , pneumonia, resolved, former smoker,  Non compliant with CPAP   Pulmonary exam normal breath sounds clear to auscultation       Cardiovascular hypertension, Pt. 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 balance issues CVA, Residual Symptoms    GI/Hepatic negative GI ROS, Neg liver ROS,   Endo/Other  diabetes, Poorly Controlled, Type 2, Insulin Dependent, Oral Hypoglycemic AgentsMorbid obesityHyperlipidemia  Renal/GU Renal InsufficiencyRenal disease  negative genitourinary   Musculoskeletal  (+) Arthritis , Osteoarthritis,  Revision of left knee total arthroplasty   Abdominal (+) + obese,   Peds  Hematology Plavix- last dose 08/20/2016   Anesthesia Other Findings   Reproductive/Obstetrics                           Lab Results  Component Value Date   WBC 10.5 08/20/2016   HGB 11.2 (L) 08/20/2016   HCT 33.9 (L) 08/20/2016   MCV 87.1 08/20/2016   PLT 259 08/20/2016     Chemistry      Component Value Date/Time   NA 139 08/20/2016 1100   K 3.9 08/20/2016 1100   CL 105 08/20/2016 1100   CO2 24 08/20/2016 1100   BUN 49 (H) 08/20/2016 1100   CREATININE 1.44 (H) 08/20/2016 1100   CREATININE 1.36 (H) 07/15/2016 1215      Component Value Date/Time   CALCIUM 9.3 08/20/2016 1100   ALKPHOS 162 (H) 01/07/2016 1414   AST 64 (H) 01/07/2016 1414   ALT 9 01/07/2016 1414   BILITOT 0.4  01/07/2016 1414     EKG 05/30/2016: normal sinus rhythm, left axis deviation.  ECHO 05/30/2016:  - Left ventricle: The cavity size was normal. Systolic function was   vigorous. The estimated ejection fraction was in the range of 65%   to 70%. Wall motion was normal; there were no regional wall   motion abnormalities. - Aortic valve: IN one view there is a suggestion of a mobile mass   on the aortic valve. suggest TEE to evaluate. Trileaflet; normal   thickness, mildly calcified leaflets. - Mitral valve: Mildly calcified annulus. - Left atrium: The atrium was mildly dilated. - Recommendations: Consider transesophageal echocardiography if   clinically indicated in order to exclude vegetation  TEE 05/31/2016: No evidence of endocarditis. There was no evidence of a   vegetation. Anesthesia Physical Anesthesia Plan  ASA: III  Anesthesia Plan: General   Post-op Pain Management:    Induction: Intravenous and Cricoid pressure planned  Airway Management Planned: Oral ETT  Additional Equipment:   Intra-op Plan:   Post-operative Plan: Extubation in OR  Informed Consent: I have reviewed the patients History and Physical, chart, labs and discussed the procedure including the risks, benefits and alternatives for the proposed anesthesia with the patient or authorized representative who has indicated his/her understanding and acceptance.   Dental advisory given  Plan Discussed with: Surgeon, Anesthesiologist and CRNA  Anesthesia Plan Comments:        Anesthesia  Quick Evaluation

## 2016-08-27 ENCOUNTER — Inpatient Hospital Stay (HOSPITAL_COMMUNITY): Payer: Medicare Other

## 2016-08-27 ENCOUNTER — Encounter (HOSPITAL_COMMUNITY): Payer: Self-pay

## 2016-08-27 ENCOUNTER — Encounter (HOSPITAL_COMMUNITY)
Admission: RE | Disposition: A | Discharge status: Skilled Nursing Facility | Payer: Self-pay | Source: Ambulatory Visit | Attending: Orthopaedic Surgery

## 2016-08-27 ENCOUNTER — Inpatient Hospital Stay (HOSPITAL_COMMUNITY)
Admission: RE | Admit: 2016-08-27 | Discharge: 2016-08-31 | DRG: 467 | Disposition: A | Discharge status: Skilled Nursing Facility | Payer: Medicare Other | Source: Ambulatory Visit | Attending: Orthopaedic Surgery | Admitting: Orthopaedic Surgery

## 2016-08-27 ENCOUNTER — Inpatient Hospital Stay (HOSPITAL_COMMUNITY): Payer: Medicare Other | Admitting: Anesthesiology

## 2016-08-27 DIAGNOSIS — Z87891 Personal history of nicotine dependence: Secondary | ICD-10-CM | POA: Diagnosis not present

## 2016-08-27 DIAGNOSIS — Z471 Aftercare following joint replacement surgery: Secondary | ICD-10-CM | POA: Diagnosis not present

## 2016-08-27 DIAGNOSIS — I129 Hypertensive chronic kidney disease with stage 1 through stage 4 chronic kidney disease, or unspecified chronic kidney disease: Secondary | ICD-10-CM | POA: Diagnosis present

## 2016-08-27 DIAGNOSIS — M1712 Unilateral primary osteoarthritis, left knee: Principal | ICD-10-CM | POA: Diagnosis present

## 2016-08-27 DIAGNOSIS — M159 Polyosteoarthritis, unspecified: Secondary | ICD-10-CM | POA: Diagnosis present

## 2016-08-27 DIAGNOSIS — Z79891 Long term (current) use of opiate analgesic: Secondary | ICD-10-CM | POA: Diagnosis not present

## 2016-08-27 DIAGNOSIS — D62 Acute posthemorrhagic anemia: Secondary | ICD-10-CM | POA: Diagnosis not present

## 2016-08-27 DIAGNOSIS — E1165 Type 2 diabetes mellitus with hyperglycemia: Secondary | ICD-10-CM | POA: Diagnosis present

## 2016-08-27 DIAGNOSIS — F329 Major depressive disorder, single episode, unspecified: Secondary | ICD-10-CM | POA: Diagnosis present

## 2016-08-27 DIAGNOSIS — Z96652 Presence of left artificial knee joint: Secondary | ICD-10-CM

## 2016-08-27 DIAGNOSIS — N183 Chronic kidney disease, stage 3 (moderate): Secondary | ICD-10-CM | POA: Diagnosis not present

## 2016-08-27 DIAGNOSIS — E7800 Pure hypercholesterolemia, unspecified: Secondary | ICD-10-CM | POA: Diagnosis present

## 2016-08-27 DIAGNOSIS — Z794 Long term (current) use of insulin: Secondary | ICD-10-CM | POA: Diagnosis not present

## 2016-08-27 DIAGNOSIS — Z6841 Body Mass Index (BMI) 40.0 and over, adult: Secondary | ICD-10-CM | POA: Diagnosis not present

## 2016-08-27 DIAGNOSIS — Z7902 Long term (current) use of antithrombotics/antiplatelets: Secondary | ICD-10-CM | POA: Diagnosis not present

## 2016-08-27 DIAGNOSIS — Z79899 Other long term (current) drug therapy: Secondary | ICD-10-CM

## 2016-08-27 DIAGNOSIS — T8454XD Infection and inflammatory reaction due to internal left knee prosthesis, subsequent encounter: Secondary | ICD-10-CM

## 2016-08-27 DIAGNOSIS — E1140 Type 2 diabetes mellitus with diabetic neuropathy, unspecified: Secondary | ICD-10-CM | POA: Diagnosis present

## 2016-08-27 DIAGNOSIS — E1122 Type 2 diabetes mellitus with diabetic chronic kidney disease: Secondary | ICD-10-CM | POA: Diagnosis present

## 2016-08-27 DIAGNOSIS — E662 Morbid (severe) obesity with alveolar hypoventilation: Secondary | ICD-10-CM | POA: Diagnosis present

## 2016-08-27 DIAGNOSIS — T8454XA Infection and inflammatory reaction due to internal left knee prosthesis, initial encounter: Secondary | ICD-10-CM

## 2016-08-27 DIAGNOSIS — R262 Difficulty in walking, not elsewhere classified: Secondary | ICD-10-CM

## 2016-08-27 DIAGNOSIS — Z8673 Personal history of transient ischemic attack (TIA), and cerebral infarction without residual deficits: Secondary | ICD-10-CM

## 2016-08-27 DIAGNOSIS — E1121 Type 2 diabetes mellitus with diabetic nephropathy: Secondary | ICD-10-CM | POA: Diagnosis not present

## 2016-08-27 DIAGNOSIS — F339 Major depressive disorder, recurrent, unspecified: Secondary | ICD-10-CM | POA: Diagnosis not present

## 2016-08-27 DIAGNOSIS — Z8739 Personal history of other diseases of the musculoskeletal system and connective tissue: Secondary | ICD-10-CM

## 2016-08-27 HISTORY — PX: TOTAL KNEE REVISION: SHX996

## 2016-08-27 LAB — GLUCOSE, CAPILLARY
GLUCOSE-CAPILLARY: 97 mg/dL (ref 65–99)
GLUCOSE-CAPILLARY: 165 mg/dL — AB (ref 65–99)
GLUCOSE-CAPILLARY: 308 mg/dL — AB (ref 65–99)
Glucose-Capillary: 262 mg/dL — ABNORMAL HIGH (ref 65–99)

## 2016-08-27 SURGERY — TOTAL KNEE REVISION
Anesthesia: General | Laterality: Left

## 2016-08-27 MED ORDER — FENTANYL CITRATE (PF) 100 MCG/2ML IJ SOLN
INTRAMUSCULAR | Status: AC
  Filled 2016-08-27: qty 4

## 2016-08-27 MED ORDER — PROPOFOL 10 MG/ML IV BOLUS
INTRAVENOUS | Status: AC
  Filled 2016-08-27: qty 40

## 2016-08-27 MED ORDER — HYDROMORPHONE HCL 1 MG/ML IJ SOLN
1.0000 mg | INTRAMUSCULAR | Status: DC | PRN
  Administered 2016-08-27: 1 mg via INTRAVENOUS
  Filled 2016-08-27 (×2): qty 1

## 2016-08-27 MED ORDER — PHENYLEPHRINE 40 MCG/ML (10ML) SYRINGE FOR IV PUSH (FOR BLOOD PRESSURE SUPPORT)
PREFILLED_SYRINGE | INTRAVENOUS | Status: DC | PRN
  Administered 2016-08-27 (×4): 40 ug via INTRAVENOUS

## 2016-08-27 MED ORDER — SODIUM CHLORIDE 0.9 % IR SOLN
Status: DC | PRN
  Administered 2016-08-27: 5000 mL

## 2016-08-27 MED ORDER — VITAMIN D 1000 UNITS PO TABS
3000.0000 [IU] | ORAL_TABLET | Freq: Every day | ORAL | Status: DC
  Administered 2016-08-27 – 2016-08-31 (×5): 3000 [IU] via ORAL
  Filled 2016-08-27 (×5): qty 3

## 2016-08-27 MED ORDER — ROCURONIUM BROMIDE 10 MG/ML (PF) SYRINGE
PREFILLED_SYRINGE | INTRAVENOUS | Status: DC | PRN
  Administered 2016-08-27: 50 mg via INTRAVENOUS
  Administered 2016-08-27: 10 mg via INTRAVENOUS

## 2016-08-27 MED ORDER — TRANEXAMIC ACID 1000 MG/10ML IV SOLN
1000.0000 mg | INTRAVENOUS | Status: AC
  Administered 2016-08-27: 1000 mg via INTRAVENOUS
  Filled 2016-08-27: qty 1100

## 2016-08-27 MED ORDER — MEPERIDINE HCL 50 MG/ML IJ SOLN: 6.2500 mg | INTRAMUSCULAR | Status: DC | PRN

## 2016-08-27 MED ORDER — ONDANSETRON HCL 4 MG/2ML IJ SOLN
INTRAMUSCULAR | Status: DC | PRN
Start: 2016-08-27 — End: 2016-08-27
  Administered 2016-08-27: 4 mg via INTRAVENOUS

## 2016-08-27 MED ORDER — INSULIN REGULAR HUMAN (CONC) 500 UNIT/ML ~~LOC~~ SOPN
70.0000 [IU] | PEN_INJECTOR | Freq: Every day | SUBCUTANEOUS | Status: DC
  Administered 2016-08-27 – 2016-08-30 (×3): 70 [IU] via SUBCUTANEOUS
  Filled 2016-08-27: qty 3

## 2016-08-27 MED ORDER — INSULIN REGULAR HUMAN (CONC) 500 UNIT/ML ~~LOC~~ SOLN: 20.0000 [IU] | SUBCUTANEOUS | Status: DC

## 2016-08-27 MED ORDER — LACTATED RINGERS IV SOLN: INTRAVENOUS | Status: DC

## 2016-08-27 MED ORDER — ONDANSETRON HCL 4 MG PO TABS: 4.0000 mg | ORAL_TABLET | Freq: Four times a day (QID) | ORAL | Status: DC | PRN

## 2016-08-27 MED ORDER — FENTANYL CITRATE (PF) 100 MCG/2ML IJ SOLN
INTRAMUSCULAR | Status: DC | PRN
  Administered 2016-08-27 (×8): 50 ug via INTRAVENOUS

## 2016-08-27 MED ORDER — TAMSULOSIN HCL 0.4 MG PO CAPS
0.8000 mg | ORAL_CAPSULE | Freq: Every day | ORAL | Status: DC
  Administered 2016-08-27 – 2016-08-30 (×4): 0.8 mg via ORAL
  Filled 2016-08-27 (×4): qty 2

## 2016-08-27 MED ORDER — METHOCARBAMOL 500 MG PO TABS
500.0000 mg | ORAL_TABLET | Freq: Four times a day (QID) | ORAL | Status: DC | PRN
  Administered 2016-08-27 – 2016-08-31 (×4): 500 mg via ORAL
  Filled 2016-08-27 (×4): qty 1

## 2016-08-27 MED ORDER — ROCURONIUM BROMIDE 50 MG/5ML IV SOSY
PREFILLED_SYRINGE | INTRAVENOUS | Status: AC
  Filled 2016-08-27: qty 5

## 2016-08-27 MED ORDER — HYDROMORPHONE HCL 1 MG/ML IJ SOLN
INTRAMUSCULAR | Status: AC
  Filled 2016-08-27: qty 1

## 2016-08-27 MED ORDER — HYDROMORPHONE HCL 1 MG/ML IJ SOLN
INTRAMUSCULAR | Status: AC
  Administered 2016-08-27: 1 mg
  Filled 2016-08-27: qty 1

## 2016-08-27 MED ORDER — SUGAMMADEX SODIUM 500 MG/5ML IV SOLN
INTRAVENOUS | Status: DC | PRN
  Administered 2016-08-27: 300 mg via INTRAVENOUS

## 2016-08-27 MED ORDER — OXYCODONE HCL 5 MG PO TABS
5.0000 mg | ORAL_TABLET | ORAL | Status: DC | PRN
  Administered 2016-08-27 – 2016-08-28 (×6): 10 mg via ORAL
  Administered 2016-08-29 (×2): 5 mg via ORAL
  Administered 2016-08-29: 10 mg via ORAL
  Administered 2016-08-30 – 2016-08-31 (×4): 5 mg via ORAL
  Filled 2016-08-27 (×2): qty 2
  Filled 2016-08-27 (×2): qty 1
  Filled 2016-08-27 (×2): qty 2
  Filled 2016-08-27: qty 1
  Filled 2016-08-27 (×4): qty 2
  Filled 2016-08-27: qty 1
  Filled 2016-08-27: qty 2
  Filled 2016-08-27: qty 1

## 2016-08-27 MED ORDER — ADULT MULTIVITAMIN W/MINERALS CH
1.0000 | ORAL_TABLET | Freq: Every day | ORAL | Status: DC
  Administered 2016-08-27 – 2016-08-31 (×5): 1 via ORAL
  Filled 2016-08-27 (×5): qty 1

## 2016-08-27 MED ORDER — CLOPIDOGREL BISULFATE 75 MG PO TABS
75.0000 mg | ORAL_TABLET | Freq: Every day | ORAL | Status: DC
  Administered 2016-08-28 – 2016-08-31 (×4): 75 mg via ORAL
  Filled 2016-08-27 (×5): qty 1

## 2016-08-27 MED ORDER — LIDOCAINE 2% (20 MG/ML) 5 ML SYRINGE
INTRAMUSCULAR | Status: DC | PRN
  Administered 2016-08-27: 100 mg via INTRAVENOUS

## 2016-08-27 MED ORDER — SODIUM CHLORIDE 0.9 % IV SOLN
INTRAVENOUS | Status: DC
  Administered 2016-08-27: 18:00:00 via INTRAVENOUS

## 2016-08-27 MED ORDER — ONDANSETRON HCL 4 MG/2ML IJ SOLN
INTRAMUSCULAR | Status: AC
  Filled 2016-08-27: qty 2

## 2016-08-27 MED ORDER — ATORVASTATIN CALCIUM 20 MG PO TABS
80.0000 mg | ORAL_TABLET | Freq: Every day | ORAL | Status: DC
  Administered 2016-08-27 – 2016-08-31 (×5): 80 mg via ORAL
  Filled 2016-08-27 (×5): qty 4

## 2016-08-27 MED ORDER — INSULIN ASPART 100 UNIT/ML ~~LOC~~ SOLN
0.0000 [IU] | Freq: Every day | SUBCUTANEOUS | Status: DC
  Administered 2016-08-27: 3 [IU] via SUBCUTANEOUS
  Administered 2016-08-29: 2 [IU] via SUBCUTANEOUS

## 2016-08-27 MED ORDER — HYDROMORPHONE HCL 1 MG/ML IJ SOLN
0.2500 mg | INTRAMUSCULAR | Status: DC | PRN
  Administered 2016-08-27 (×2): 0.5 mg via INTRAVENOUS

## 2016-08-27 MED ORDER — DOCUSATE SODIUM 100 MG PO CAPS
100.0000 mg | ORAL_CAPSULE | Freq: Two times a day (BID) | ORAL | Status: DC
  Administered 2016-08-27 – 2016-08-30 (×6): 100 mg via ORAL
  Filled 2016-08-27 (×8): qty 1

## 2016-08-27 MED ORDER — METFORMIN HCL ER 500 MG PO TB24
1000.0000 mg | ORAL_TABLET | Freq: Two times a day (BID) | ORAL | Status: DC
  Administered 2016-08-28 – 2016-08-29 (×2): 1000 mg via ORAL
  Filled 2016-08-27 (×4): qty 2

## 2016-08-27 MED ORDER — ACETAMINOPHEN 325 MG PO TABS: 650.0000 mg | ORAL_TABLET | Freq: Four times a day (QID) | ORAL | Status: DC | PRN

## 2016-08-27 MED ORDER — LIDOCAINE 2% (20 MG/ML) 5 ML SYRINGE
INTRAMUSCULAR | Status: AC
  Filled 2016-08-27: qty 5

## 2016-08-27 MED ORDER — INSULIN REGULAR HUMAN (CONC) 500 UNIT/ML ~~LOC~~ SOPN
20.0000 [IU] | PEN_INJECTOR | Freq: Every day | SUBCUTANEOUS | Status: DC
  Administered 2016-08-28 – 2016-08-31 (×4): 20 [IU] via SUBCUTANEOUS
  Filled 2016-08-27: qty 3

## 2016-08-27 MED ORDER — FENTANYL CITRATE (PF) 100 MCG/2ML IJ SOLN
INTRAMUSCULAR | Status: AC
  Filled 2016-08-27: qty 2

## 2016-08-27 MED ORDER — INSULIN ASPART 100 UNIT/ML ~~LOC~~ SOLN
0.0000 [IU] | Freq: Three times a day (TID) | SUBCUTANEOUS | Status: DC
  Administered 2016-08-27: 15 [IU] via SUBCUTANEOUS
  Administered 2016-08-28: 4 [IU] via SUBCUTANEOUS
  Administered 2016-08-29 (×2): 11 [IU] via SUBCUTANEOUS
  Administered 2016-08-30 (×2): 4 [IU] via SUBCUTANEOUS

## 2016-08-27 MED ORDER — SUCCINYLCHOLINE CHLORIDE 200 MG/10ML IV SOSY
PREFILLED_SYRINGE | INTRAVENOUS | Status: DC | PRN
  Administered 2016-08-27: 140 mg via INTRAVENOUS

## 2016-08-27 MED ORDER — AMLODIPINE BESYLATE 10 MG PO TABS
10.0000 mg | ORAL_TABLET | Freq: Every day | ORAL | Status: DC
  Administered 2016-08-28 – 2016-08-31 (×4): 10 mg via ORAL
  Filled 2016-08-27 (×4): qty 1

## 2016-08-27 MED ORDER — METOCLOPRAMIDE HCL 5 MG/ML IJ SOLN: 5.0000 mg | Freq: Three times a day (TID) | INTRAMUSCULAR | Status: DC | PRN

## 2016-08-27 MED ORDER — ALUM & MAG HYDROXIDE-SIMETH 200-200-20 MG/5ML PO SUSP: 30.0000 mL | ORAL | Status: DC | PRN

## 2016-08-27 MED ORDER — INSULIN REGULAR HUMAN (CONC) 500 UNIT/ML ~~LOC~~ SOPN
130.0000 [IU] | PEN_INJECTOR | Freq: Every day | SUBCUTANEOUS | Status: DC
  Administered 2016-08-28 – 2016-08-31 (×3): 130 [IU] via SUBCUTANEOUS
  Filled 2016-08-27: qty 3

## 2016-08-27 MED ORDER — ASPIRIN EC 325 MG PO TBEC
325.0000 mg | DELAYED_RELEASE_TABLET | Freq: Two times a day (BID) | ORAL | Status: DC
  Administered 2016-08-28 – 2016-08-31 (×7): 325 mg via ORAL
  Filled 2016-08-27 (×7): qty 1

## 2016-08-27 MED ORDER — MENTHOL 3 MG MT LOZG
1.0000 | LOZENGE | OROMUCOSAL | Status: DC | PRN
  Filled 2016-08-27: qty 9

## 2016-08-27 MED ORDER — METHOCARBAMOL 1000 MG/10ML IJ SOLN
500.0000 mg | Freq: Four times a day (QID) | INTRAVENOUS | Status: DC | PRN
  Administered 2016-08-27: 500 mg via INTRAVENOUS
  Filled 2016-08-27: qty 550
  Filled 2016-08-27: qty 5

## 2016-08-27 MED ORDER — SACCHAROMYCES BOULARDII 250 MG PO CAPS
250.0000 mg | ORAL_CAPSULE | Freq: Every day | ORAL | Status: DC
  Administered 2016-08-27 – 2016-08-31 (×5): 250 mg via ORAL
  Filled 2016-08-27 (×5): qty 1

## 2016-08-27 MED ORDER — FUROSEMIDE 40 MG PO TABS
80.0000 mg | ORAL_TABLET | Freq: Every day | ORAL | Status: DC
  Administered 2016-08-28 – 2016-08-31 (×4): 80 mg via ORAL
  Filled 2016-08-27 (×5): qty 2

## 2016-08-27 MED ORDER — METOCLOPRAMIDE HCL 5 MG PO TABS: 5.0000 mg | ORAL_TABLET | Freq: Three times a day (TID) | ORAL | Status: DC | PRN

## 2016-08-27 MED ORDER — METOCLOPRAMIDE HCL 5 MG/ML IJ SOLN: 10.0000 mg | Freq: Once | INTRAMUSCULAR | Status: DC | PRN

## 2016-08-27 MED ORDER — ONDANSETRON HCL 4 MG/2ML IJ SOLN
4.0000 mg | Freq: Four times a day (QID) | INTRAMUSCULAR | Status: DC | PRN
  Administered 2016-08-29 – 2016-08-30 (×2): 4 mg via INTRAVENOUS
  Filled 2016-08-27 (×2): qty 2

## 2016-08-27 MED ORDER — PHENOL 1.4 % MT LIQD
1.0000 | OROMUCOSAL | Status: DC | PRN
  Filled 2016-08-27: qty 177

## 2016-08-27 MED ORDER — LACTATED RINGERS IV SOLN
INTRAVENOUS | Status: DC | PRN
  Administered 2016-08-27 (×2): via INTRAVENOUS
  Administered 2016-08-27: 1000 mL

## 2016-08-27 MED ORDER — ACETAMINOPHEN 650 MG RE SUPP: 650.0000 mg | Freq: Four times a day (QID) | RECTAL | Status: DC | PRN

## 2016-08-27 MED ORDER — SUGAMMADEX SODIUM 500 MG/5ML IV SOLN
INTRAVENOUS | Status: AC
  Filled 2016-08-27: qty 5

## 2016-08-27 MED ORDER — SUCCINYLCHOLINE CHLORIDE 200 MG/10ML IV SOSY
PREFILLED_SYRINGE | INTRAVENOUS | Status: AC
  Filled 2016-08-27: qty 10

## 2016-08-27 MED ORDER — CHLORHEXIDINE GLUCONATE 4 % EX LIQD: 60.0000 mL | Freq: Once | CUTANEOUS | Status: DC

## 2016-08-27 MED ORDER — CEFAZOLIN SODIUM-DEXTROSE 2-4 GM/100ML-% IV SOLN
2.0000 g | Freq: Four times a day (QID) | INTRAVENOUS | Status: AC
  Administered 2016-08-27 (×2): 2 g via INTRAVENOUS
  Filled 2016-08-27 (×2): qty 100

## 2016-08-27 MED ORDER — DIPHENHYDRAMINE HCL 12.5 MG/5ML PO ELIX
12.5000 mg | ORAL_SOLUTION | ORAL | Status: DC | PRN
  Administered 2016-08-28: 25 mg via ORAL
  Filled 2016-08-27: qty 10

## 2016-08-27 MED ORDER — PROPOFOL 10 MG/ML IV BOLUS
INTRAVENOUS | Status: DC | PRN
  Administered 2016-08-27: 200 mg via INTRAVENOUS

## 2016-08-27 SURGICAL SUPPLY — 61 items
ADAPTER BOLT FEMORAL +2/-2 (Knees) ×2 IMPLANT
AUGMENT FMRL POST PFC 4MM SZ5 (Orthopedic Implant) ×2 IMPLANT
BAG ZIPLOCK 12X15 (MISCELLANEOUS) ×2 IMPLANT
BANDAGE ACE 6X5 VEL STRL LF (GAUZE/BANDAGES/DRESSINGS) ×4 IMPLANT
BENZOIN TINCTURE PRP APPL 2/3 (GAUZE/BANDAGES/DRESSINGS) IMPLANT
BLADE SAG 18X100X1.27 (BLADE) ×2 IMPLANT
BLADE SAW SGTL 13.0X1.19X90.0M (BLADE) ×2 IMPLANT
BONE CEMENT PALACOSE (Orthopedic Implant) ×4 IMPLANT
CEMENT BONE PALACOSE (Orthopedic Implant) ×2 IMPLANT
CLOTH BEACON ORANGE TIMEOUT ST (SAFETY) ×2 IMPLANT
CUFF TOURN SGL QUICK 34 (TOURNIQUET CUFF) ×1
CUFF TRNQT CYL 34X4X40X1 (TOURNIQUET CUFF) ×1 IMPLANT
DRAPE U-SHAPE 47X51 STRL (DRAPES) ×2 IMPLANT
DRSG PAD ABDOMINAL 8X10 ST (GAUZE/BANDAGES/DRESSINGS) ×4 IMPLANT
DURAPREP 26ML APPLICATOR (WOUND CARE) ×2 IMPLANT
ELECT REM PT RETURN 9FT ADLT (ELECTROSURGICAL) ×2
ELECTRODE REM PT RTRN 9FT ADLT (ELECTROSURGICAL) ×1 IMPLANT
EVACUATOR 1/8 PVC DRAIN (DRAIN) IMPLANT
FEM POST AUG PFC 4MM SZ5 (Orthopedic Implant) ×4 IMPLANT
FEMORAL ADAPTER (Orthopedic Implant) ×2 IMPLANT
FEMUR LFT TC3 REVISION (Knees) ×2 IMPLANT
GAUZE SPONGE 4X4 12PLY STRL (GAUZE/BANDAGES/DRESSINGS) ×2 IMPLANT
GAUZE XEROFORM 5X9 LF (GAUZE/BANDAGES/DRESSINGS) ×2 IMPLANT
GLOVE BIO SURGEON STRL SZ7.5 (GLOVE) ×4 IMPLANT
GLOVE BIOGEL PI IND STRL 8 (GLOVE) ×2 IMPLANT
GLOVE BIOGEL PI INDICATOR 8 (GLOVE) ×2
GLOVE ECLIPSE 8.0 STRL XLNG CF (GLOVE) ×2 IMPLANT
GOWN STRL REUS W/TWL XL LVL3 (GOWN DISPOSABLE) ×6 IMPLANT
HANDPIECE INTERPULSE COAX TIP (DISPOSABLE) ×1
IMMOBILIZER KNEE 20 (SOFTGOODS) ×2
IMMOBILIZER KNEE 20 THIGH 36 (SOFTGOODS) ×1 IMPLANT
INSERT TIB TC3 RP SZ5 15 (Knees) ×2 IMPLANT
NS IRRIG 1000ML POUR BTL (IV SOLUTION) ×2 IMPLANT
PACK TOTAL KNEE CUSTOM (KITS) ×2 IMPLANT
PADDING CAST COTTON 6X4 STRL (CAST SUPPLIES) ×4 IMPLANT
PATELLA DOME PFC 35MM (Knees) ×2 IMPLANT
POSITIONER SURGICAL ARM (MISCELLANEOUS) ×2 IMPLANT
SET HNDPC FAN SPRY TIP SCT (DISPOSABLE) ×1 IMPLANT
SET PAD KNEE POSITIONER (MISCELLANEOUS) ×2 IMPLANT
SLEEVE MBT PROUS ML 29MM (Knees) ×2 IMPLANT
SLEEVE UNIV FEM DIST PRO SZ 31 (Sleeve) ×2 IMPLANT
SPONGE LAP 18X18 X RAY DECT (DISPOSABLE) ×8 IMPLANT
STAPLER VISISTAT 35W (STAPLE) ×4 IMPLANT
STEM FLUTED UNIV REV 75X20 (Stem) ×2 IMPLANT
STEM UNIVERSAL REVISION 75X18 (Stem) ×2 IMPLANT
STRIP CLOSURE SKIN 1/2X4 (GAUZE/BANDAGES/DRESSINGS) IMPLANT
SUT MNCRL AB 4-0 PS2 18 (SUTURE) ×2 IMPLANT
SUT VIC AB 0 CT1 36 (SUTURE) ×2 IMPLANT
SUT VIC AB 1 CT1 36 (SUTURE) ×8 IMPLANT
SUT VIC AB 2-0 CT1 27 (SUTURE) ×2
SUT VIC AB 2-0 CT1 TAPERPNT 27 (SUTURE) ×2 IMPLANT
SWAB COLLECTION DEVICE MRSA (MISCELLANEOUS) IMPLANT
SWAB CULTURE ESWAB REG 1ML (MISCELLANEOUS) IMPLANT
TOWER CARTRIDGE SMART MIX (DISPOSABLE) IMPLANT
TRAY FOLEY W/METER SILVER 16FR (SET/KITS/TRAYS/PACK) ×2 IMPLANT
TRAY REVISION SZ 4 (Knees) ×2 IMPLANT
TUBE KAMVAC SUCTION (TUBING) IMPLANT
WATER STERILE IRR 1000ML POUR (IV SOLUTION) ×4 IMPLANT
WATER STERILE IRR 1500ML POUR (IV SOLUTION) IMPLANT
WEDGE LEFT SIZE 54MM (Knees) ×4 IMPLANT
YANKAUER SUCT BULB TIP NO VENT (SUCTIONS) ×2 IMPLANT

## 2016-08-27 NOTE — Progress Notes (Signed)
Portable AP and Lateral Left Knee X-rays done. 

## 2016-08-27 NOTE — Anesthesia Procedure Notes (Signed)
Procedure Name: Intubation Date/Time: 08/27/2016 7:31 AM Performed by: Epimenio SarinJARVELA, Kailah Pennel R Pre-anesthesia Checklist: Patient identified, Emergency Drugs available, Suction available, Patient being monitored and Timeout performed Patient Re-evaluated:Patient Re-evaluated prior to inductionOxygen Delivery Method: Circle system utilized Preoxygenation: Pre-oxygenation with 100% oxygen Intubation Type: IV induction, Cricoid Pressure applied and Rapid sequence Ventilation: Mask ventilation without difficulty, Mask ventilation with difficulty and Oral airway inserted - appropriate to patient size Laryngoscope Size: Mac and 3 Grade View: Grade II Tube type: Oral Tube size: 7.5 mm Number of attempts: 1 Airway Equipment and Method: Stylet Placement Confirmation: ETT inserted through vocal cords under direct vision,  positive ETCO2 and breath sounds checked- equal and bilateral Secured at: 23 cm Tube secured with: Tape Dental Injury: Teeth and Oropharynx as per pre-operative assessment  Comments: Grade 2 view with downward laryngeal pressure, ETT easily passed through cords.

## 2016-08-27 NOTE — Anesthesia Postprocedure Evaluation (Signed)
Anesthesia Post Note  Patient: Marc Vasquez  Procedure(s) Performed: Procedure(s) (LRB): LEFT TOTAL KNEE REVISION WITH REMOVAL OF ANTIBIOTIC SPACER (Left)  Patient location during evaluation: PACU Anesthesia Type: General Level of consciousness: awake and alert and oriented Pain management: pain level controlled Vital Signs Assessment: post-procedure vital signs reviewed and stable Respiratory status: spontaneous breathing, nonlabored ventilation and respiratory function stable Cardiovascular status: blood pressure returned to baseline and stable Postop Assessment: no signs of nausea or vomiting Anesthetic complications: no    Last Vitals:  Vitals:   08/27/16 1119 08/27/16 1130  BP:  106/79  Pulse: 81 82  Resp: 16 12  Temp:      Last Pain:  Vitals:   08/27/16 1105  TempSrc:   PainSc: 5                  Tienna Bienkowski,Marc A.

## 2016-08-27 NOTE — H&P (Addendum)
TOTAL KNEE REVISION ADMISSION H&P  Patient is being admitted for left revision total knee arthroplasty.  Subjective:  Chief Complaint:left knee pain.  HPI: Marc SprinklesMichael R Tinner, 70 y.o. male, has a history of pain and functional disability in the left knee(s) due to failed previous arthroplasty and and infection who is status-post left knee resection and placement of an antibiotic spacer and patient has failed non-surgical conservative treatments for greater than 12 weeks to include use of assistive devices, activity modification and IV antibiotics. The indications for the revision of the total knee arthroplasty are history of total knee infection. Onset of symptoms was abrupt starting 1 years ago with stable course since that time.  Prior procedures on the left knee(s) include arthroplasty and and resection arthroplasty with placement of antibiotic spacer.  Patient currently rates pain in the left knee(s) at 5 out of 10 with activity. There is worsening of pain with activity and weight bearing.  He has been on IV antibioitcs for over 6 weeks.   Patient Active Problem List   Diagnosis Date Noted  . 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 Septic arthritis (HCC) 05/28/2016  . Staph aureus infection 01/07/2016  . Infection of prosthetic left knee joint (HCC) 01/07/2016  . History of arthroplasty of left knee 01/07/2016  . Former smoker 01/07/2016  . Poorly controlled type 2 diabetes mellitus (HCC) 01/07/2016  . History of CVA (cerebrovascular accident) 01/07/2016  . Diabetic neuropathy (HCC) 01/07/2016  . Essential hypertension 01/07/2016   Past Medical History:  Diagnosis Date  . Chronic kidney disease    stage 3 kidney failure  . Depression   . 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 Former smoker quit on 12/15/15  . Generalized OA   . History of left knee replacement   . History of stroke   .  Hypercholesteremia   . Infection of prosthetic left knee joint (HCC)   . Left hip pain   . Lumbago   . OSA (obstructive sleep apnea)   . Pneumonia   . Poorly controlled diabetes mellitus (HCC)   . Scarlet fever     Past Surgical History:  Procedure Laterality Date  . EYE SURGERY     broken blood vessels, catarcts bil  . JOINT REPLACEMENT    . left foot surgery     stepped on thorn as child and got infected  . TEE WITHOUT CARDIOVERSION N/A 05/31/2016   Procedure: TRANSESOPHAGEAL ECHOCARDIOGRAM (TEE);  Surgeon: Jake BatheMark C Skains, MD;  [facility];  Service: Cardiovascular;  Laterality: N/A;  . TOE AMPUTATION     partial toe amputation  . TOTAL KNEE REVISION Left 05/28/2016   Procedure: REMOVAL OF ALL COMPONENTS OF LEFT TOTAL KNEE AND PLACEMENT OF ANTIBIOTIC SPACER;  Surgeon: Kathryne Hitchhristopher Y Jeet Shough, MD;  Location: [facility] OR;  Service: Orthopedics;  Laterality: Left;    Prescriptions Prior to Admission  Medication Sig Dispense Refill Last Dose  . acetaminophen (TYLENOL) 325 MG tablet Take 650 mg by mouth every 6 (six) hours as needed (for breakthrough pain).   08/26/2016 at 1600  . amLODipine (NORVASC) 10 MG tablet Take 10 mg by mouth daily.   08/27/2016 at 0400  . ARTIFICIAL TEAR OP Apply 1 drop to eye daily as needed (dry eyes).   08/26/2016 at Unknown time  . atorvastatin (LIPITOR) 80 MG tablet Take 80 mg by mouth daily.   08/26/2016 at am  . Cholecalciferol (VITAMIN  D3) 1000 units CAPS Take 3,000 Units by mouth daily.   Past Week at Unknown time  . clopidogrel (PLAVIX) 75 MG tablet Take 75 mg by mouth daily.   08/20/2016  . furosemide (LASIX) 80 MG tablet Take 80 mg by mouth daily. May take an additional 80mg s if swelling persists   08/26/2016 at Unknown time  . HYDROcodone-acetaminophen (NORCO) 10-325 MG tablet Take 1 tablet by mouth every 6 (six) hours as needed for moderate pain or severe pain.   Past Week at Unknown time  . insulin regular human CONCENTRATED (HUMULIN R) 500  UNIT/ML injection Inject 20-130 Units into the skin See admin instructions. Use 130 units at breakfast, use 20 units at lunch, then use 70 units at dinner   08/26/2016 at 1800  . lisinopril-hydrochlorothiazide (PRINZIDE,ZESTORETIC) 10-12.5 MG tablet Take 1 tablet by mouth daily.   08/26/2016 at am  . metFORMIN (GLUCOPHAGE-XR) 500 MG 24 hr tablet Take 1,000 mg by mouth 2 (two) times daily after a meal.   08/26/2016 at pm  . Multiple Vitamin (MULTIVITAMIN WITH MINERALS) TABS tablet Take 1 tablet by mouth daily.   Past Week at Unknown time  . saccharomyces boulardii (FLORASTOR) 250 MG capsule Take 1 capsule (250 mg total) by mouth 2 (two) times daily. (Patient taking differently: Take 250 mg by mouth daily. ) 60 capsule 3 08/26/2016 at pm  . tamsulosin (FLOMAX) 0.4 MG CAPS capsule Take 0.8 mg by mouth at bedtime.    08/26/2016 at pm  . diphenhydrAMINE (BENADRYL) 12.5 MG/5ML elixir Take 5-10 mLs (12.5-25 mg total) by mouth every 4 (four) hours as needed for itching. (Patient not taking: Reported on 07/28/2016) 120 mL 0 Not Taking  . methocarbamol (ROBAXIN) 500 MG tablet Take 1 tablet (500 mg total) by mouth every 6 (six) hours as needed for muscle spasms. (Patient not taking: Reported on 07/28/2016) 30 tablet 0 Not Taking  . metoCLOPramide (REGLAN) 5 MG tablet Take 1 tablet (5 mg total) by mouth every 8 (eight) hours as needed for nausea (if ondansetron (ZOFRAN) ineffective.). (Patient not taking: Reported on 07/28/2016) 30 tablet 0 Not Taking  . oxyCODONE (OXY IR/ROXICODONE) 5 MG immediate release tablet Take 1-2 tablets (5-10 mg total) by mouth every 4 (four) hours as needed for breakthrough pain. (Patient not taking: Reported on 07/28/2016) 30 tablet 0 Not Taking   Allergies  Allergen Reactions  . Oxycontin [Oxycodone Hcl] Itching    Social History  Substance Use Topics  . Smoking status: Former Smoker    Packs/day: 1.00    Years: 15.00    Types: Cigarettes    Quit date: 12/15/2015  . Smokeless  tobacco: Never Used  . Alcohol use 0.6 oz/week    1 Standard drinks or equivalent per week     Comment: rarely    Family History  Problem Relation Age of Onset  . Heart disease    . Diabetes        Review of Systems  All other systems reviewed and are negative.    Objective:  Physical Exam  Constitutional: He is oriented to person, place, and time. He appears well-developed and well-nourished.  HENT:  Head: Normocephalic and atraumatic.  Eyes: EOM are normal. Pupils are equal, round, and reactive to light.  Neck: Normal range of motion. Neck supple.  Cardiovascular: Normal rate and regular rhythm.   Respiratory: Effort normal and breath sounds normal.  GI: Soft. Bowel sounds are normal.  Musculoskeletal:       Left knee: He  exhibits decreased range of motion and swelling.  Neurological: He is alert and oriented to person, place, and time.  Skin: Skin is warm and dry.  Psychiatric: He has a normal mood and affect.    Vital signs in last 24 hours: Temp:  [97.6 F (36.4 C)] 97.6 F (36.4 C) (11/24 0601) Pulse Rate:  [73] 73 (11/24 0601) Resp:  [16] 16 (11/24 0601) BP: (137)/(59) 137/59 (11/24 0601) SpO2:  [99 %] 99 % (11/24 0601) Weight:  [330 lb (149.7 kg)] 330 lb (149.7 kg) (11/24 0603)  Labs:  Estimated body mass index is 46.03 kg/m as calculated from the following:   Height as of this encounter: 5\' 11"  (1.803 m).   Weight as of this encounter: 330 lb (149.7 kg).  Assessment/Plan:  History of left total knee infection post resection of the components and treatment with IV antibiotics and antibiotic spacer  Revision total knee arthroplasty is deemed medically necessary. The risks and benefits of revision total knee arthroplasty were presented and reviewed. The risks due to aseptic loosening, infection, stiffness, patella tracking problems, thromboembolic complications and other imponderables were discussed. The patient acknowledged the explanation, agreed to  proceed with the plan and consent was signed. Patient is being admitted for inpatient treatment for surgery, pain control, PT, OT, prophylactic antibiotics, VTE prophylaxis, progressive ambulation and ADL's and discharge planning.The patient is planning to be discharged home with home health services vs skilled nursing placement pending his post-operative progress with therapy.

## 2016-08-27 NOTE — Op Note (Deleted)
  The note originally documented on this encounter has been moved the the encounter in which it belongs.  

## 2016-08-27 NOTE — Transfer of Care (Signed)
Immediate Anesthesia Transfer of Care Note  Patient: Marc Vasquez  Procedure(s) Performed: Procedure(s): LEFT TOTAL KNEE REVISION WITH REMOVAL OF ANTIBIOTIC SPACER (Left)  Patient Location: PACU  Anesthesia Type:General  Level of Consciousness:  sedated, patient cooperative and responds to stimulation  Airway & Oxygen Therapy:Patient Spontanous Breathing and Patient connected to face mask oxgen  Post-op Assessment:  Report given to PACU RN and Post -op Vital signs reviewed and stable  Post vital signs:  Reviewed and stable  Last Vitals:  Vitals:   08/27/16 0601  BP: (!) 137/59  Pulse: 73  Resp: 16  Temp: 36.4 C    Complications: No apparent anesthesia complications

## 2016-08-27 NOTE — Op Note (Signed)
NAME:  Marc Vasquez, Marc Vasquez                 ACCOUNT NO.:  [id number]  MEDICAL RECORD NO.:  [id number]  LOCATION:                                 FACILITY:  PHYSICIAN:  Vanita PandaChristopher Y. Magnus IvanBlackman, M.D.DATE OF BIRTH:  1946/07/11  DATE OF PROCEDURE:  08/27/2016 DATE OF DISCHARGE:                              OPERATIVE REPORT   PREOPERATIVE DIAGNOSES: 1. History of left total knee arthroplasty. 2. History of excision arthroplasty of left total knee components and     placement of antibiotic spacer. 3. History of IV antibiotics via PICC line for over 6 weeks.  POSTOPERATIVE DIAGNOSES: 1. History of left total knee arthroplasty. 2. History of excision arthroplasty of left total knee components and     placement of antibiotic spacer. 3. History of IV antibiotics via PICC line for over 6 weeks.  PROCEDURE: 1. Removal of antibiotic spacer, left knee. 2. Revision arthroplasty of left knee with revision of femoral     component, tibial component, and patellar component.  IMPLANTS:  DePuy M.B.T. Revision knee system with a size 5 femur with a press-fit stem, a size 4 tibia with a press-fit stem, 15-mm rotating platform polyethylene insert, size 35 patellar button.  ASSISTANT:  Richardean CanalGilbert Clark, PA-C.  ANESTHESIA:  General.  BLOOD LOSS:  Less than 200 mL.  TOURNIQUET TIME:  Just less than 2 hours.  ANTIBIOTICS:  3 g of IV Ancef.  COMPLICATIONS:  None.  INDICATIONS:  Marc Vasquez is a morbidly obese 70 year old gentleman with poorly-controlled diabetes, who had history of a total knee arthroplasty of the [facility] that eventually did get infected and Marc Vasquez was seen in [place] Marc Vasquez is from, where they performed an irrigation and debridement of his knee.  Marc Vasquez eventually showed in [facility] for family reasons and I was consulted.  At that point, Marc Vasquez had an obvious infection again of his left total knee and we recommended at this point removal  of all components thorough irrigation, debridement, synovectomy, and placement of antibiotic spacer with at least 6 weeks of IV antibiotics.  Marc Vasquez has been followed closely by the Infectious Disease service as well as Marc Vasquez gotten his blood glucose in better control.  At this point, the time is right for proceeding with a removal of antibiotic spacer and revision of the components.  Marc Vasquez understands the risk of acute blood loss anemia, nerve and vessel injury, fracture, infection, DVT.  Marc Vasquez understands that Marc Vasquez has a high risk of infection due to his obesity, his previous infection, and his poorly-controlled diabetes.  I believe the next step for situation like this would be likely above-the-knee amputation if Marc Vasquez becomes reinfected.  DESCRIPTION OF PROCEDURE:  After informed consent was obtained, appropriate left leg was marked.  Marc Vasquez was brought to the operating room, placed supine on the operating table.  General anesthesia was then obtained.  A Foley catheter was placed.  A nonsterile tourniquet was placed around his upper left thigh.  His left thigh, knee, and ankle were prepped and draped with DuraPrep and sterile drapes including a sterile stockinette.  Time-out was called.  Marc Vasquez was identified as correct  patient and correct left knee.  We then used an Esmarch to wrap out the leg and tourniquet was inflated to 300 mm of pressure.  We made a midline incision over the patella and carried this proximally and distally through his previous incision, dissected down the knee joint where we carried out a medial parapatellar arthrotomy and found a joint effusion, but no evidence of gross infection.  The tissue actually looked very good.  We removed the antibiotic spacer easily and then performed another synovectomy of the knee removing any type of inflamed- appearing tissue, but again there was no appearance of gross infection at all.  We then irrigated 3 L of normal saline solution through the knee.   We then began with the revision portion of the case.  First, we repaired the tibia with a freshening tibial cut after reaming for a size 18 press-fit stem.  We were to make our freshening cut of the tibia and set our rotation off the tibial tubercle and the femur and we reamed up to a size 18 femoral stem.  We then put our distal femoral cutting block and was able to make our distal femoral freshening cut followed by setting of rotation off the tibia at 90 degrees with a spacer block and making our anterior and posterior cuts followed by our chamfer cuts.  We then made our femoral box cut.  We then trialed a size 4 tibia with a stem and a size 5 femur with a stem and we placed a 15-mm mobile bearing platform trial tibial tray.  This gave him good stability and range of motion.  We then made a freshening cut of the patella and drilled 3 holes for a size 35 patellar button.  We then removed all components and irrigated the knee once again with normal saline solution.  We then opened up all of our real components and said these with the rotation based off our trial components as well.  Once we had all these components together, we mixed our Palacos cement and then cemented our real tibial tray with a press-fit stem and then cement over the tibial plateau followed by the real femur.  We placed the real 15-mm rotating platform polyethylene insert and cemented our patellar button.  Once the cement had hardened, we let the tourniquet down.  Hemostasis was obtained with electrocautery.  We then irrigated the knee again freshly with normal saline solution and closed the arthrotomy with #1 Vicryl suture followed by 0 Vicryl in the deep tissue, 2-0 Vicryl in the subcutaneous tissue, interrupted staples on the skin.  Xeroform and well- padded sterile dressing were applied.  Marc Vasquez was awakened, extubated, and taken to the recovery room in stable condition.  All final counts were correct.  There were no  complications noted.  Of note, Richardean CanalGilbert Clark, PA- C assisted in the entire case.  His assistance was crucial facilitating all aspects of this case.     Vanita Pandahristopher Y. Magnus IvanBlackman, M.D.   ______________________________ Vanita Pandahristopher Y. Magnus IvanBlackman, M.D.    CYB/MEDQ  D:  08/27/2016  T:  08/27/2016  Job:  [id number]

## 2016-08-27 NOTE — Brief Op Note (Signed)
08/27/2016  10:07 AM  PATIENT:  Marc Vasquez  70 y.o. male  PRE-OPERATIVE DIAGNOSIS:  History of infected left total knee  POST-OPERATIVE DIAGNOSIS:  History of infected left total knee  PROCEDURE:  Procedure(s): LEFT TOTAL KNEE REVISION WITH REMOVAL OF ANTIBIOTIC SPACER (Left)  SURGEON:  Surgeon(s) and Role:    * Kathryne Hitchhristopher Y Elisah Parmer, MD - Primary  PHYSICIAN ASSISTANT: Rexene EdisonGil Clark, PA-C  ANESTHESIA:   general  EBL:  Total I/O In: 1000 [I.V.:1000] Out: 250 [Urine:250]  COUNTS:  YES  TOURNIQUET:   Total Tourniquet Time Documented: Thigh (Left) - 120 minutes Total: Thigh (Left) - 120 minutes   PLAN OF CARE: Admit to inpatient   PATIENT DISPOSITION:  PACU - hemodynamically stable.   Delay start of Pharmacological VTE agent (>24hrs) due to surgical blood loss or risk of bleeding: no

## 2016-08-27 NOTE — Progress Notes (Signed)
X-ray results noted 

## 2016-08-27 NOTE — Progress Notes (Signed)
Dr. Malen GauzeFoster in- aware patient does not use or have CPAP-

## 2016-08-28 LAB — BASIC METABOLIC PANEL
Anion gap: 7 (ref 5–15)
BUN: 43 mg/dL — AB (ref 6–20)
CO2: 25 mmol/L (ref 22–32)
Calcium: 8.6 mg/dL — ABNORMAL LOW (ref 8.9–10.3)
Chloride: 106 mmol/L (ref 101–111)
Creatinine, Ser: 1.71 mg/dL — ABNORMAL HIGH (ref 0.61–1.24)
GFR calc Af Amer: 45 mL/min — ABNORMAL LOW (ref >60)
GFR, EST NON AFRICAN AMERICAN: 38 mL/min — AB (ref >60)
Glucose, Bld: 139 mg/dL — ABNORMAL HIGH (ref 65–99)
POTASSIUM: 4.9 mmol/L (ref 3.5–5.1)
SODIUM: 138 mmol/L (ref 135–145)

## 2016-08-28 LAB — CBC
HCT: 25.9 % — ABNORMAL LOW (ref 39.0–52.0)
Hemoglobin: 8.6 g/dL — ABNORMAL LOW (ref 13.0–17.0)
MCH: 29.2 pg (ref 26.0–34.0)
MCHC: 33.2 g/dL (ref 30.0–36.0)
MCV: 87.8 fL (ref 78.0–100.0)
PLATELETS: 219 10*3/uL (ref 150–400)
RBC: 2.95 MIL/uL — AB (ref 4.22–5.81)
RDW: 14.1 % (ref 11.5–15.5)
WBC: 11.3 10*3/uL — AB (ref 4.0–10.5)

## 2016-08-28 LAB — GLUCOSE, CAPILLARY
GLUCOSE-CAPILLARY: 157 mg/dL — AB (ref 65–99)
GLUCOSE-CAPILLARY: 101 mg/dL — AB (ref 65–99)
Glucose-Capillary: 42 mg/dL — CL (ref 65–99)
Glucose-Capillary: 78 mg/dL (ref 65–99)
Glucose-Capillary: 70 mg/dL (ref 65–99)

## 2016-08-28 LAB — HEMOGLOBIN A1C
HEMOGLOBIN A1C: 8.6 % — AB (ref 4.8–5.6)
Mean Plasma Glucose: 200 mg/dL

## 2016-08-28 NOTE — Progress Notes (Signed)
CBG: 42  Treatment: 15 GM carbohydrate snack (brought apple juice and patient just received dinner.)  Symptoms: Lethargy   Follow-up CBG: Time: 1703 CBG Result:78  Possible Reasons for Event: Inadequate meal intake

## 2016-08-28 NOTE — Evaluation (Addendum)
Physical Therapy Evaluation Patient Details Name: Marc Vasquez MRN: [id number] DOB: 07/03/1946 Today's Date: 08/28/2016   History of Present Illness  s/p LEFT TOTAL KNEE REVISION WITH REMOVAL OF ANTIBIOTIC SPACER (Left)  Clinical Impression  Pt admitted with above diagnosis. Pt currently with functional limitations due to the deficits listed below (see PT Problem List).  Pt will benefit from skilled PT to increase their independence and safety with mobility to allow discharge to the venue listed below.  Pt required +3 to clear buttocks from bed for brief period of time and unable to achieve full standing.  He will need SNF rehab.     Follow Up Recommendations SNF;Supervision for mobility/OOB    Equipment Recommendations  None recommended by PT    Recommendations for Other Services       Precautions / Restrictions Precautions Precautions: Knee Required Braces or Orthoses: Knee Immobilizer - Left Knee Immobilizer - Left: Other (comment) (no orders, but in the room) Restrictions Weight Bearing Restrictions: No Other Position/Activity Restrictions: WBAT      Mobility  Bed Mobility Overal bed mobility: +2 for physical assistance;+ 2 for safety/equipment;Needs Assistance Bed Mobility: Supine to Sit;Sit to Supine     Supine to sit: Max assist;Total assist;+2 for physical assistance;+2 for safety/equipment;HOB elevated Sit to supine: Total assist;+2 for physical assistance;+2 for safety/equipment   General bed mobility comments: +3 A sit to supine  Transfers Overall transfer level: Needs assistance Equipment used: Rolling walker (2 wheeled) Transfers: Sit to/from Stand Sit to Stand: Total assist;+2 physical assistance;+2 safety/equipment;From elevated surface         General transfer comment: attempted to stand 3 times. With +2 A pt unable to even clear bottom. With +3 A pt able to clear bottom just enough to move linens. Patient unable to fully stand at all this  session.  Ambulation/Gait                Stairs            Wheelchair Mobility    Modified Rankin (Stroke Patients Only)       Balance Overall balance assessment: Needs assistance   Sitting balance-Leahy Scale: Fair       Standing balance-Leahy Scale: Zero                               Pertinent Vitals/Pain Pain Assessment: 0-10 Pain Score: 5  Pain Location: L knee Pain Descriptors / Indicators: Aching;Sore Pain Intervention(s): Limited activity within patient's tolerance;Monitored during session;Premedicated before session;Repositioned    Home Living Family/patient expects to be discharged to:: Skilled nursing facility Living Arrangements: Spouse/significant other                    Prior Function Level of Independence: Needs assistance   Gait / Transfers Assistance Needed: wife assisted with transfers; mostly non-amb  ADL's / Homemaking Assistance Needed: wife assisted with ADLs  Comments: Patient reports that since he came home from SNF rehab, his wife has been assisting with all ADLs. He can only transfer to potty chair or wheelchair from a lift chair using a RW.     Hand Dominance   Dominant Hand: Right    Extremity/Trunk Assessment   Upper Extremity Assessment: Defer to OT evaluation           Lower Extremity Assessment: LLE deficits/detail;Generalized weakness         Communication   Communication: No difficulties  Cognition  Arousal/Alertness: Awake/alert Behavior During Therapy: WFL for tasks assessed/performed Overall Cognitive Status: Within Functional Limits for tasks assessed                      General Comments General comments (skin integrity, edema, etc.): Pt incontinent of urine and bed linens needed to be changed    Exercises Total Joint Exercises Ankle Circles/Pumps: AROM;Both;10 reps;Seated   Assessment/Plan    PT Assessment Patient needs continued PT services  PT Problem List  Decreased strength;Decreased range of motion;Decreased activity tolerance;Decreased balance;Decreased mobility;Pain          PT Treatment Interventions DME instruction;Functional mobility training;Therapeutic activities;Therapeutic exercise;Patient/family education;Balance training    PT Goals (Current goals can be found in the Care Plan section)  Acute Rehab PT Goals Patient Stated Goal: to return to [facility] for rehab PT Goal Formulation: With patient Time For Goal Achievement: 09/04/16 Potential to Achieve Goals: Fair    Frequency 7X/week   Barriers to discharge        Co-evaluation   Reason for Co-Treatment: For patient/therapist safety PT goals addressed during session: Mobility/safety with mobility;Proper use of DME OT goals addressed during session: ADL's and self-care       End of Session Equipment Utilized During Treatment: Gait belt;Left knee immobilizer Activity Tolerance: Patient limited by pain Patient left: in bed;with call bell/phone within reach;with nursing/sitter in room Nurse Communication: Mobility status         Time: [id number] PT Time Calculation (min) (ACUTE ONLY): 32 min   Charges:   PT Evaluation $PT Eval Moderate Complexity: 1 Procedure     PT G Codes:        Helaina Stefano LUBECK 08/28/2016, 11:07 AM

## 2016-08-28 NOTE — Progress Notes (Signed)
Occupational Therapy Evaluation Patient Details Name: Marc Vasquez MRN: [id number] DOB: 1945/10/08 Today's Date: 08/28/2016    History of Present Illness s/p LEFT TOTAL KNEE REVISION WITH REMOVAL OF ANTIBIOTIC SPACER (Left)   Clinical Impression   Patient presents to OT with decreased ADL independence and safety, currently requiring +3 for attempts to stand. All further OT needs can be met at next venue of care (SNF). Recommend SNF for rehab at this time.     Follow Up Recommendations  SNF    Equipment Recommendations  Other (comment) (tbd next venue of care)    Recommendations for Other Services       Precautions / Restrictions Precautions Precautions: Knee Required Braces or Orthoses: Knee Immobilizer - Left Restrictions Weight Bearing Restrictions: No Other Position/Activity Restrictions: WBAT      Mobility Bed Mobility Overal bed mobility: +2 for physical assistance;+ 2 for safety/equipment;Needs Assistance Bed Mobility: Supine to Sit;Sit to Supine     Supine to sit: Max assist;Total assist;+2 for physical assistance;+2 for safety/equipment;HOB elevated Sit to supine: Total assist;+2 for physical assistance;+2 for safety/equipment   General bed mobility comments: +3 A sit to supine  Transfers Overall transfer level: Needs assistance Equipment used: Rolling walker (2 wheeled) Transfers: Sit to/from Stand Sit to Stand: Total assist;+2 physical assistance;+2 safety/equipment;From elevated surface         General transfer comment: attempted to stand 3 times. With +2 A pt unable to even clear bottom. With +3 A pt able to clear bottom just enough to move linens. Patient unable to fully stand at all this session.    Balance                                            ADL Overall ADL's : Needs assistance/impaired Eating/Feeding: Set up;Bed level   Grooming: Minimal assistance;Moderate assistance;Oral care;Sitting Grooming Details  (indicate cue type and reason): kept leaning posteriorly and required assistance for sitting balance Upper Body Bathing: Total assistance   Lower Body Bathing: Total assistance   Upper Body Dressing : Total assistance   Lower Body Dressing: Total assistance     Toilet Transfer Details (indicate cue type and reason): pt currently unable to stand even with +3 assistance Toileting- Clothing Manipulation and Hygiene: Total assistance       Functional mobility during ADLs: Total assistance;+2 for physical assistance;+2 for safety/equipment;Rolling walker (requires +3 A to just clear bottom; unable to fully stand)       Vision     Perception     Praxis      Pertinent Vitals/Pain Pain Assessment: 0-10 Pain Score: 5  Pain Location: L knee Pain Descriptors / Indicators: Aching;Sore Pain Intervention(s): Premedicated before session;Repositioned     Hand Dominance Right   Extremity/Trunk Assessment Upper Extremity Assessment Upper Extremity Assessment: Generalized weakness   Lower Extremity Assessment Lower Extremity Assessment: Defer to PT evaluation       Communication Communication Communication: No difficulties   Cognition Arousal/Alertness: Awake/alert Behavior During Therapy: WFL for tasks assessed/performed Overall Cognitive Status: Within Functional Limits for tasks assessed                     General Comments       Exercises       Shoulder Instructions      Home Living Family/patient expects to be discharged to:: Skilled nursing facility Living  Arrangements: Spouse/significant other                                      Prior Functioning/Environment Level of Independence: Needs assistance  Gait / Transfers Assistance Needed: wife assisted with transfers; mostly non-amb ADL's / 81 Assistance Needed: wife assisted with ADLs   Comments: Patient reports that since he came home from SNF rehab, his wife has been assisting  with all ADLs. He can only transfer to potty chair or wheelchair from a lift chair using a RW.        OT Problem List: Decreased strength;Decreased range of motion;Decreased activity tolerance;Impaired balance (sitting and/or standing);Decreased safety awareness;Decreased knowledge of use of DME or AE;Decreased knowledge of precautions;Obesity;Pain   OT Treatment/Interventions:      OT Goals(Current goals can be found in the care plan section) Acute Rehab OT Goals Patient Stated Goal: to return to [facility] for rehab OT Goal Formulation: All assessment and education complete, DC therapy  OT Frequency:     Barriers to D/C:            Co-evaluation PT/OT/SLP Co-Evaluation/Treatment: Yes Reason for Co-Treatment: For patient/therapist safety PT goals addressed during session: Mobility/safety with mobility;Proper use of DME OT goals addressed during session: ADL's and self-care      End of Session Equipment Utilized During Treatment: Rolling walker;Left knee immobilizer CPM Left Knee CPM Left Knee: Off Nurse Communication: Mobility status  Activity Tolerance: Patient limited by pain Patient left: in bed;with call bell/phone within reach;with nursing/sitter in room   Time: 0932-1004 OT Time Calculation (min): 32 min Charges:  OT General Charges $OT Visit: 1 Procedure OT Evaluation $OT Eval Moderate Complexity: 1 Procedure G-Codes:    Marc Vasquez A Sep 22, 2016, 10:18 AM

## 2016-08-28 NOTE — Progress Notes (Signed)
Subjective: 1 Day Post-Op Procedure(s) (LRB): LEFT TOTAL KNEE REVISION WITH REMOVAL OF ANTIBIOTIC SPACER (Left) Patient reports pain as moderate.  Acute blood loss anemia from surgery.  Will follow closely.  Objective: Vital signs in last 24 hours: Temp:  [97.4 F (36.3 C)-98.8 F (37.1 C)] 98.5 F (36.9 C) (11/25 0537) Pulse Rate:  [79-113] 86 (11/25 0537) Resp:  [12-23] 16 (11/25 0537) BP: (106-165)/(44-79) 144/44 (11/25 0537) SpO2:  [92 %-99 %] 94 % (11/25 0537)  Intake/Output from previous day: 11/24 0701 - 11/25 0700 In: 3855 [P.O.:680; I.V.:2920; IV Piggyback:255] Out: 1850 [Urine:1750; Blood:100] Intake/Output this shift: Total I/O In: 261.3 [I.V.:261.3] Out: -    Recent Labs  08/28/16 0443  HGB 8.6*    Recent Labs  08/28/16 0443  WBC 11.3*  RBC 2.95*  HCT 25.9*  PLT 219    Recent Labs  08/28/16 0443  NA 138  K 4.9  CL 106  CO2 25  BUN 43*  CREATININE 1.71*  GLUCOSE 139*  CALCIUM 8.6*   No results for input(s): LABPT, INR in the last 72 hours.  Sensation intact distally Intact pulses distally Dorsiflexion/Plantar flexion intact Incision: dressing C/D/I Compartment soft  Assessment/Plan: 1 Day Post-Op Procedure(s) (LRB): LEFT TOTAL KNEE REVISION WITH REMOVAL OF ANTIBIOTIC SPACER (Left) Up with therapy - WBAT Check H/H and BMET tomorrow  Kathryne HitchChristopher Y Tamla Winkels 08/28/2016, 9:33 AM

## 2016-08-29 LAB — CBC
HEMATOCRIT: 24.1 % — AB (ref 39.0–52.0)
Hemoglobin: 7.8 g/dL — ABNORMAL LOW (ref 13.0–17.0)
MCH: 28.9 pg (ref 26.0–34.0)
MCHC: 32.4 g/dL (ref 30.0–36.0)
MCV: 89.3 fL (ref 78.0–100.0)
Platelets: 194 10*3/uL (ref 150–400)
RBC: 2.7 MIL/uL — ABNORMAL LOW (ref 4.22–5.81)
RDW: 14.3 % (ref 11.5–15.5)
WBC: 11.7 10*3/uL — ABNORMAL HIGH (ref 4.0–10.5)

## 2016-08-29 LAB — BASIC METABOLIC PANEL
Anion gap: 6 (ref 5–15)
BUN: 52 mg/dL — AB (ref 6–20)
CALCIUM: 8.4 mg/dL — AB (ref 8.9–10.3)
CO2: 23 mmol/L (ref 22–32)
CREATININE: 2.32 mg/dL — AB (ref 0.61–1.24)
Chloride: 105 mmol/L (ref 101–111)
GFR calc non Af Amer: 27 mL/min — ABNORMAL LOW (ref >60)
GFR, EST AFRICAN AMERICAN: 31 mL/min — AB (ref >60)
Glucose, Bld: 170 mg/dL — ABNORMAL HIGH (ref 65–99)
Potassium: 5.3 mmol/L — ABNORMAL HIGH (ref 3.5–5.1)
SODIUM: 134 mmol/L — AB (ref 135–145)

## 2016-08-29 LAB — GLUCOSE, CAPILLARY
Glucose-Capillary: 201 mg/dL — ABNORMAL HIGH (ref 65–99)
Glucose-Capillary: 275 mg/dL — ABNORMAL HIGH (ref 65–99)
Glucose-Capillary: 293 mg/dL — ABNORMAL HIGH (ref 65–99)
Glucose-Capillary: 239 mg/dL — ABNORMAL HIGH (ref 65–99)

## 2016-08-29 LAB — PREPARE RBC (CROSSMATCH)

## 2016-08-29 MED ORDER — FUROSEMIDE 10 MG/ML IJ SOLN
20.0000 mg | Freq: Once | INTRAMUSCULAR | Status: AC
  Administered 2016-08-29: 20 mg via INTRAVENOUS
  Filled 2016-08-29: qty 2

## 2016-08-29 MED ORDER — SODIUM CHLORIDE 0.9 % IV SOLN: Freq: Once | INTRAVENOUS | Status: DC

## 2016-08-29 NOTE — Progress Notes (Signed)
PT Cancellation Note  Patient Details Name: Marisa SprinklesMichael R Roots MRN: [id number] DOB: 04/17/1946   Cancelled Treatment:     pt receiving blood and IV site is at L hand.     Felecia ShellingLori Karrington Mccravy  PTA [facility]  Acute  Rehab Pager      [phone]

## 2016-08-29 NOTE — Care Management Note (Signed)
Case Management Note  Patient Details  Name: Marisa SprinklesMichael R Menning MRN: [id number] Date of Birth: 09/22/1946  Subjective/Objective:  S/p Left Total Knee Revision                 Action/Plan: Discharge Planning: NCM spoke to pt's wife. Wife states she wants pt to go back to SNF at [facility]. He was there at one time after is surgery in the past and they did well. CSW referral for SNF placement.    Expected Discharge Date:               Expected Discharge Plan:  Skilled Nursing Facility  In-House Referral:  Clinical Social Work  Discharge planning Services  CM Consult  Post Acute Care Choice:  NA Choice offered to:  NA  DME Arranged:  N/A DME Agency:  NA  HH Arranged:  NA HH Agency:  NA  Status of Service:  Completed, signed off  If discussed at Long Length of Stay Meetings, dates discussed:    Additional Comments:  Elliot CousinShavis, Rayvon Dakin Ellen, RN 08/29/2016, 10:47 AM

## 2016-08-29 NOTE — Progress Notes (Signed)
Subjective: 2 Days Post-Op Procedure(s) (LRB): LEFT TOTAL KNEE REVISION WITH REMOVAL OF ANTIBIOTIC SPACER (Left) Patient reports pain as moderate.  Acute blood loss anemia and acute renal insufficiency related to his blood loss from surgery.  Objective: Vital signs in last 24 hours: Temp:  [98.6 F (37 C)-99.6 F (37.6 C)] 99.1 F (37.3 C) (11/26 0518) Pulse Rate:  [99-110] 105 (11/26 0518) Resp:  [18-20] 18 (11/26 0518) BP: (115-134)/(44-46) 115/44 (11/26 0518) SpO2:  [84 %-92 %] 92 % (11/26 0518)  Intake/Output from previous day: 11/25 0701 - 11/26 0700 In: 1041.3 [P.O.:780; I.V.:261.3] Out: 75 [Urine:75] Intake/Output this shift: No intake/output data recorded.   Recent Labs  08/28/16 0443 08/29/16 0425  HGB 8.6* 7.8*    Recent Labs  08/28/16 0443 08/29/16 0425  WBC 11.3* 11.7*  RBC 2.95* 2.70*  HCT 25.9* 24.1*  PLT 219 194    Recent Labs  08/28/16 0443 08/29/16 0425  NA 138 134*  K 4.9 5.3*  CL 106 105  CO2 25 23  BUN 43* 52*  CREATININE 1.71* 2.32*  GLUCOSE 139* 170*  CALCIUM 8.6* 8.4*   No results for input(s): LABPT, INR in the last 72 hours.  Intact pulses distally Dorsiflexion/Plantar flexion intact Incision: scant drainage No cellulitis present Compartment soft  Assessment/Plan: 2 Days Post-Op Procedure(s) (LRB): LEFT TOTAL KNEE REVISION WITH REMOVAL OF ANTIBIOTIC SPACER (Left) Up with therapy  Transfuse 2 units of blood today. Check CBC and BMET tomorrow am. Needs FL-2 to sign SNF tomorrow if H/H improves and creatinine improves.  Kathryne HitchChristopher Y Germain Koopmann 08/29/2016, 8:43 AM

## 2016-08-29 NOTE — Clinical Social Work Placement (Signed)
   CLINICAL SOCIAL WORK PLACEMENT  NOTE  Date:  08/29/2016  Patient Details  Name: Marisa SprinklesMichael R Werk MRN: [id number] Date of Birth: 07/02/1946  Clinical Social Work is seeking post-discharge placement for this patient at the Skilled  Nursing Facility level of care (*CSW will initial, date and re-position this form in  chart as items are completed):  Yes   Patient/family provided with [facility] Clinical Social Work Department's list of facilities offering this level of care within the geographic area requested by the patient (or if unable, by the patient's family).  Yes   Patient/family informed of their freedom to choose among providers that offer the needed level of care, that participate in Medicare, Medicaid or managed care program needed by the patient, have an available bed and are willing to accept the patient.  Yes   Patient/family informed of [facility]'s ownership interest in [facility] and [facility], as well as of the fact that they are under no obligation to receive care at these facilities.  PASRR submitted to EDS on       PASRR number received on       Existing PASRR number confirmed on 08/29/16     FL2 transmitted to all facilities in geographic area requested by pt/family on 08/29/16     FL2 transmitted to all facilities within larger geographic area on       Patient informed that his/her managed care company has contracts with or will negotiate with certain facilities, including the following:            Patient/family informed of bed offers received.  Patient chooses bed at       Physician recommends and patient chooses bed at      Patient to be transferred to   on  .  Patient to be transferred to facility by       Patient family notified on   of transfer.  Name of family member notified:        PHYSICIAN Please prepare priority discharge summary, including medications, Please prepare prescriptions, Please sign FL2     Additional Comment:     _______________________________________________ Venita Lickampbell, Foster Frericks B, LCSW 08/29/2016, 11:08 AM

## 2016-08-29 NOTE — Clinical Social Work Note (Signed)
Clinical Social Work Assessment  Patient Details  Name: Marc Vasquez MRN: [id number] Date of Birth: Aug 19, 1946  Date of referral:  08/29/16               Reason for consult:  Facility Placement, Discharge Planning                Permission sought to share information with:  Facility Sport and exercise psychologist, Family Supports Permission granted to share information::  Yes, Verbal Permission Granted  Name::     Engineer, manufacturing::  SNFs  Relationship::     Contact Information:     Housing/Transportation Living arrangements for the past 2 months:  Single Family Home Source of Information:  Patient, Spouse Patient Interpreter Needed:  None Criminal Activity/Legal Involvement Pertinent to Current Situation/Hospitalization:  No - Comment as needed Significant Relationships:  Spouse Lives with:  Spouse Do you feel safe going back to the place where you live?  Yes Need for family participation in patient care:  Yes (Comment)  Care giving concerns:  The patient and wife are concerned about the patient's need for rehab at discharge. Both plan for placement at SNF.   Social Worker assessment / plan:  CSW met with patient and wife at bedside to complete assessment. The patient and wife share that the patient is from home with wife but plans to discharge to SNF for rehab given his increased care needs. CSW explained SNF search/placement process and answered the patient's questions. Wife and patient state that the following facilities are preferred: 1. [facility] 2. [facility] 3. [facility] 4. [facility]. CSW will make referrals and followup Monday with bed offers.   Employment status:  Retired Forensic scientist:  [employer] PT Recommendations:  [place] / Referral to community resources:  [place]  Patient/Family's Response to care:  The patient and wife are pleased with the care the patient has received. They both appreciate  assistance with placement.   Patient/Family's Understanding of and Emotional Response to Diagnosis, Current Treatment, and Prognosis:  The patient and wife appear to have a good understanding of the patient diagnosis and post DC needs. Both state that they are expecting the patient to be ready for DC 11/27.   Emotional Assessment Appearance:  Appears stated age Attitude/Demeanor/Rapport:  Other (Patient is appropriate and welcoming of CSW.) Affect (typically observed):  Accepting, Appropriate, Calm, Pleasant Orientation:  Oriented to Self, Oriented to Place, Oriented to  Time, Oriented to Situation Alcohol / Substance use:  Not Applicable Psych involvement (Current and /or in the community):  No (Comment)  Discharge Needs  Concerns to be addressed:  Discharge Planning Concerns Readmission within the last 30 days:  No Current discharge risk:  Physical Impairment Barriers to Discharge:  Continued Medical Work up   Rigoberto Noel, LCSW 08/29/2016, 10:55 AM

## 2016-08-29 NOTE — NC FL2 (Signed)
[place] MEDICAID FL2 LEVEL OF CARE SCREENING TOOL     IDENTIFICATION  Patient Name: Marc SprinklesMichael R Vasquez Birthdate: 07/21/1946 Sex: male Admission Date (Current Location): 08/27/2016  [facility] [place]  [place] [place]   Facility and Address:  [facility],  [address], [place] [id number]      Provider Number: [id number]  Attending Physician Name and Address:  Kathryne Hitchhristopher Y Blackman, *  Relative Name and Phone Number:       Current Level of Care: Hospital Recommended Level of Care: Skilled Nursing Facility Prior Approval Number:    Date Approved/Denied:   PASRR Number: [id number] [id number]  Discharge Plan: SNF    Current Diagnoses: Patient Active Problem List   Diagnosis Date Noted  . 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 Status post revision of total replacement of left knee 08/27/2016  . Septic arthritis (HCC) 05/28/2016  . Staph aureus infection 01/07/2016  . Infection of prosthetic left knee joint (HCC) 01/07/2016  . History of arthroplasty of left knee 01/07/2016  . Former smoker 01/07/2016  . Poorly controlled type 2 diabetes mellitus (HCC) 01/07/2016  . History of CVA (cerebrovascular accident) 01/07/2016  . Diabetic neuropathy (HCC) 01/07/2016  . Essential hypertension 01/07/2016    Orientation RESPIRATION BLADDER Height & Weight     Self, Time, Situation, Place  O2 (2L) Incontinent Weight: (!) 149.7 kg (330 lb) Height:  5\' 11"  (180.3 cm)  BEHAVIORAL SYMPTOMS/MOOD NEUROLOGICAL BOWEL NUTRITION STATUS   (NONE)  (NONE) Continent Diet (Carb Mod)  AMBULATORY STATUS COMMUNICATION OF NEEDS Skin   Extensive Assist Verbally Surgical wounds (Incisions knee left)                       Personal Care Assistance Level of Assistance  Bathing, Feeding, Dressing Bathing Assistance: Limited assistance Feeding assistance: Independent Dressing Assistance: Limited assistance     Functional Limitations Info  Sight,  Hearing, Speech Sight Info: Adequate Hearing Info: Adequate Speech Info: Adequate    SPECIAL CARE FACTORS FREQUENCY  PT (By licensed PT), OT (By licensed OT)     PT Frequency: 5 OT Frequency: 5            Contractures Contractures Info: Not present    Additional Factors Info  Code Status, Allergies, Insulin Sliding Scale Code Status Info: Full Code Allergies Info: Oxycontin   Insulin Sliding Scale Info: 3/day       Current Medications (08/29/2016):  This is the current hospital active medication list Current Facility-Administered Medications  Medication Dose Route Frequency Provider Last Rate Last Dose  . 0.9 %  sodium chloride infusion   Intravenous Continuous Kathryne Hitchhristopher Y Blackman, MD   Stopped at 08/28/16 [phone]  . 0.9 %  sodium chloride infusion   Intravenous Once Kathryne Hitchhristopher Y Blackman, MD      . acetaminophen (TYLENOL) tablet 650 mg  650 mg Oral Q6H PRN Kathryne Hitchhristopher Y Blackman, MD       Or  . acetaminophen (TYLENOL) suppository 650 mg  650 mg Rectal Q6H PRN Kathryne Hitchhristopher Y Blackman, MD      . alum & mag hydroxide-simeth (MAALOX/MYLANTA) 200-200-20 MG/5ML suspension 30 mL  30 mL Oral Q4H PRN Kathryne Hitchhristopher Y Blackman, MD      . amLODipine (NORVASC) tablet 10 mg  10 mg Oral Daily Kathryne Hitchhristopher Y Blackman, MD   10 mg at 08/29/16 0915  . aspirin EC tablet 325 mg  325 mg Oral BID PC Kathryne Hitchhristopher Y Blackman, MD  325 mg at 08/29/16 0915  . atorvastatin (LIPITOR) tablet 80 mg  80 mg Oral Daily Kathryne Hitchhristopher Y Blackman, MD   80 mg at 08/29/16 0916  . cholecalciferol (VITAMIN D) tablet 3,000 Units  3,000 Units Oral Daily Kathryne Hitchhristopher Y Blackman, MD   3,000 Units at 08/29/16 [phone]  . clopidogrel (PLAVIX) tablet 75 mg  75 mg Oral Daily Kathryne Hitchhristopher Y Blackman, MD   75 mg at 08/29/16 0916  . diphenhydrAMINE (BENADRYL) 12.5 MG/5ML elixir 12.5-25 mg  12.5-25 mg Oral Q4H PRN Kathryne Hitchhristopher Y Blackman, MD   25 mg at 08/28/16 1640  . docusate sodium (COLACE) capsule 100 mg  100 mg Oral BID Kathryne Hitchhristopher Y  Blackman, MD   100 mg at 08/29/16 0916  . furosemide (LASIX) injection 20 mg  20 mg Intravenous Once Kathryne Hitchhristopher Y Blackman, MD   Stopped at 08/29/16 [phone]  . furosemide (LASIX) tablet 80 mg  80 mg Oral Daily Kathryne Hitchhristopher Y Blackman, MD   80 mg at 08/29/16 0915  . HYDROmorphone (DILAUDID) injection 1 mg  1 mg Intravenous Q2H PRN Kathryne Hitchhristopher Y Blackman, MD   1 mg at 08/27/16 1744  . insulin aspart (novoLOG) injection 0-20 Units  0-20 Units Subcutaneous TID WC Kathryne Hitchhristopher Y Blackman, MD   4 Units at 08/28/16 [phone]  . insulin aspart (novoLOG) injection 0-5 Units  0-5 Units Subcutaneous QHS Kathryne Hitchhristopher Y Blackman, MD   3 Units at 08/27/16 2231  . insulin regular human CONCENTRATED (HUMULIN R) 500 UNIT/ML kwikpen 130 Units  130 Units Subcutaneous Q breakfast Kathryne Hitchhristopher Y Blackman, MD   130 Units at 08/28/16 [phone]  . insulin regular human CONCENTRATED (HUMULIN R) 500 UNIT/ML kwikpen 20 Units  20 Units Subcutaneous Q lunch Kathryne Hitchhristopher Y Blackman, MD   20 Units at 08/28/16 1216  . insulin regular human CONCENTRATED (HUMULIN R) 500 UNIT/ML kwikpen 70 Units  70 Units Subcutaneous Q supper Kathryne Hitchhristopher Y Blackman, MD   70 Units at 08/27/16 1744  . menthol-cetylpyridinium (CEPACOL) lozenge 3 mg  1 lozenge Oral PRN Kathryne Hitchhristopher Y Blackman, MD       Or  . phenol (CHLORASEPTIC) mouth spray 1 spray  1 spray Mouth/Throat PRN Kathryne Hitchhristopher Y Blackman, MD      . metFORMIN (GLUCOPHAGE-XR) 24 hr tablet 1,000 mg  1,000 mg Oral BID PC Kathryne Hitchhristopher Y Blackman, MD   1,000 mg at 08/29/16 0916  . methocarbamol (ROBAXIN) tablet 500 mg  500 mg Oral Q6H PRN Kathryne Hitchhristopher Y Blackman, MD   500 mg at 08/29/16 0916   Or  . methocarbamol (ROBAXIN) 500 mg in dextrose 5 % 50 mL IVPB  500 mg Intravenous Q6H PRN Kathryne Hitchhristopher Y Blackman, MD   500 mg at 08/27/16 1100  . metoCLOPramide (REGLAN) tablet 5-10 mg  5-10 mg Oral Q8H PRN Kathryne Hitchhristopher Y Blackman, MD       Or  . metoCLOPramide (REGLAN) injection 5-10 mg  5-10 mg Intravenous Q8H PRN Kathryne Hitchhristopher  Y Blackman, MD      . multivitamin with minerals tablet 1 tablet  1 tablet Oral Daily Kathryne Hitchhristopher Y Blackman, MD   1 tablet at 08/29/16 [phone]  . ondansetron (ZOFRAN) tablet 4 mg  4 mg Oral Q6H PRN Kathryne Hitchhristopher Y Blackman, MD       Or  . ondansetron [facility](ZOFRAN) injection 4 mg  4 mg Intravenous Q6H PRN Kathryne Hitchhristopher Y Blackman, MD      . oxyCODONE (Oxy IR/ROXICODONE) immediate release tablet 5-10 mg  5-10 mg Oral Q3H PRN Kathryne Hitchhristopher Y Blackman, MD   5 mg  at 08/29/16 0916  . saccharomyces boulardii (FLORASTOR) capsule 250 mg  250 mg Oral Daily Kathryne Hitch, MD   250 mg at 08/29/16 0916  . tamsulosin (FLOMAX) capsule 0.8 mg  0.8 mg Oral QHS Kathryne Hitch, MD   0.8 mg at 08/28/16 2138     Discharge Medications: Please see discharge summary for a list of discharge medications.  Relevant Imaging Results:  Relevant Lab Results:   Additional Information SSN: [id number]  Venita Lick, LCSW

## 2016-08-30 ENCOUNTER — Encounter (HOSPITAL_COMMUNITY): Payer: Self-pay | Admitting: Orthopaedic Surgery

## 2016-08-30 LAB — BASIC METABOLIC PANEL
Anion gap: 7 (ref 5–15)
BUN: 71 mg/dL — AB (ref 6–20)
CALCIUM: 8.5 mg/dL — AB (ref 8.9–10.3)
CHLORIDE: 103 mmol/L (ref 101–111)
CO2: 23 mmol/L (ref 22–32)
CREATININE: 2.36 mg/dL — AB (ref 0.61–1.24)
GFR calc Af Amer: 30 mL/min — ABNORMAL LOW (ref >60)
GFR, EST NON AFRICAN AMERICAN: 26 mL/min — AB (ref >60)
Glucose, Bld: 136 mg/dL — ABNORMAL HIGH (ref 65–99)
Potassium: 4.8 mmol/L (ref 3.5–5.1)
SODIUM: 133 mmol/L — AB (ref 135–145)

## 2016-08-30 LAB — CBC
HCT: 26.9 % — ABNORMAL LOW (ref 39.0–52.0)
Hemoglobin: 9.2 g/dL — ABNORMAL LOW (ref 13.0–17.0)
MCH: 29 pg (ref 26.0–34.0)
MCHC: 34.2 g/dL (ref 30.0–36.0)
MCV: 84.9 fL (ref 78.0–100.0)
PLATELETS: 227 10*3/uL (ref 150–400)
RBC: 3.17 MIL/uL — ABNORMAL LOW (ref 4.22–5.81)
RDW: 13.9 % (ref 11.5–15.5)
WBC: 13.6 10*3/uL — ABNORMAL HIGH (ref 4.0–10.5)

## 2016-08-30 LAB — GLUCOSE, CAPILLARY
GLUCOSE-CAPILLARY: 154 mg/dL — AB (ref 65–99)
GLUCOSE-CAPILLARY: 159 mg/dL — AB (ref 65–99)
Glucose-Capillary: 141 mg/dL — ABNORMAL HIGH (ref 65–99)
Glucose-Capillary: 64 mg/dL — ABNORMAL LOW (ref 65–99)
Glucose-Capillary: 44 mg/dL — CL (ref 65–99)
Glucose-Capillary: 49 mg/dL — ABNORMAL LOW (ref 65–99)
Glucose-Capillary: 81 mg/dL (ref 65–99)

## 2016-08-30 LAB — TYPE AND SCREEN
ABO/RH(D): O POS
ANTIBODY SCREEN: NEGATIVE
UNIT DIVISION: 0
Unit division: 0

## 2016-08-30 NOTE — Progress Notes (Signed)
Pt has [facility] [place] [facility] tomorrow if stable for d/c. CSW will continue to follow to assist with d/c planning.  Cori RazorJamie Jaysten Essner LCSW [phone]

## 2016-08-30 NOTE — Progress Notes (Signed)
Inpatient Diabetes Program Recommendations  AACE/ADA: New Consensus Statement on Inpatient Glycemic Control (2015)  Target Ranges:  Prepandial:   less than 140 mg/dL      Peak postprandial:   less than 180 mg/dL (1-2 hours)      Critically ill patients:  140 - 180 mg/dL   Lab Results  Component Value Date   GLUCAP 154 (H) 08/30/2016   HGBA1C 8.6 (H) 08/27/2016   Results for [name], Marc Vasquez (MRN [id number]) as of 08/30/2016 11:06  Ref. Range 08/29/2016 07:25 08/29/2016 11:42 08/29/2016 17:15 08/29/2016 22:21 08/30/2016 07:53  Glucose-Capillary Latest Ref Range: 65 - 99 mg/dL 829201 (H) 562275 (H) 130293 (H) 239 (H) 154 (H)    Review of Glycemic Control  Diabetes history: DM2 Outpatient Diabetes medications: Humulin Vasquez U500 120-130 units with breakfast, 20-30 units with lunch, 70 units with supper, metformin 1000 mg bid Current orders for Inpatient glycemic control: Humulin Vasquez 130 units QAM, 20 units at Lunch and 70 units at supper, Novolog 0-20 units TID with meals + HS, metformin 1000 mg bid  Blood sugars much improved this am.  Will continue to follow.   Thank you. Ailene Ardshonda Constance Whittle, RD, LDN, CDE Inpatient Diabetes Coordinator [phone]

## 2016-08-30 NOTE — Progress Notes (Signed)
Physical Therapy Treatment Patient Details Name: Marc SprinklesMichael R Vasquez MRN: [id number] DOB: 06/07/1946 Today's Date: 08/30/2016    History of Present Illness s/p LEFT TOTAL KNEE REVISION WITH REMOVAL OF ANTIBIOTIC SPACER (Left)    PT Comments    Pt continues to require Max-Total assist +2 for bed mobility. LOB x 2, posteriorly, while sitting EOB. Mod encouragement to get pt to sit at EOB for as long as he could tolerate (pt sat for at least 5 minutes). Encouraged pt to perform LE exercises on R LE when in bed, as tolerated. Pt will need SNF for continued rehab. Recommend lift equipment for OOB<>chair for safe transfers for now.    Follow Up Recommendations  SNF     Equipment Recommendations  None recommended by PT    Recommendations for Other Services       Precautions / Restrictions Precautions Precautions: Fall;Knee Required Braces or Orthoses: Knee Immobilizer - Left Restrictions Weight Bearing Restrictions: No Other Position/Activity Restrictions: WBAT    Mobility  Bed Mobility Overal bed mobility: Needs Assistance Bed Mobility: Supine to Sit;Sit to Supine     Supine to sit: Max assist;+2 for physical assistance;+2 for safety/equipment;HOB elevated Sit to supine: Total assist;+2 for physical assistance;+2 for safety/equipment   General bed mobility comments: Assist for trunk and bil LEs. Increased time. Multimodal cues for safety, technique. Utilized bedpad for scooting, positioning.   Transfers Overall transfer level: Needs assistance               General transfer comment: Pt unable to stand on today. LOB x2 while sitting EOB.   Ambulation/Gait                 Stairs            Wheelchair Mobility    Modified Rankin (Stroke Patients Only)       Balance Overall balance assessment: Needs assistance   Sitting balance-Leahy Scale: Poor Sitting balance - Comments: LOb x 2 while sitting EOB. Assist required to return trunk to upright and  stabilize for first few minutes. Pt eventually progressed to close Min guard assist. Pt immediately requesting return to supine-Mod encouragement to get pt to sit EOB as long as possible.                             Cognition Arousal/Alertness: Awake/alert Behavior During Therapy: WFL for tasks assessed/performed Overall Cognitive Status: Within Functional Limits for tasks assessed                      Exercises      General Comments        Pertinent Vitals/Pain Pain Assessment: Faces Faces Pain Scale: Hurts even more Pain Location: L knee with activity Pain Descriptors / Indicators: Aching;Sore Pain Intervention(s): Limited activity within patient's tolerance;Repositioned    Home Living                      Prior Function            PT Goals (current goals can now be found in the care plan section) Progress towards PT goals: Progressing toward goals (very slowly)    Frequency    7X/week      PT Plan Current plan remains appropriate    Co-evaluation             End of Session Equipment Utilized During Treatment: Left knee immobilizer Activity Tolerance:  Patient limited by fatigue;Patient limited by pain Patient left: in bed;with call bell/phone within reach;with bed alarm set;with family/visitor present     Time: [id number] PT Time Calculation (min) (ACUTE ONLY): 20 min  Charges:  $Therapeutic Activity: 8-22 mins                    G Codes:      Rebeca AlertJannie Dyanna Seiter, MPT Pager: [phone]

## 2016-08-30 NOTE — Progress Notes (Signed)
Subjective: 3 Days Post-Op Procedure(s) (LRB): LEFT TOTAL KNEE REVISION WITH REMOVAL OF ANTIBIOTIC SPACER (Left) Patient reports pain as moderate.  H/H improved with transfusion, but creatinine still trending up.  Objective: Vital signs in last 24 hours: Temp:  [98.7 F (37.1 C)-100 F (37.8 C)] 98.7 F (37.1 C) (11/27 0602) Pulse Rate:  [93-111] 93 (11/27 0602) Resp:  [15-20] 20 (11/27 0602) BP: (123-150)/(40-57) 132/57 (11/27 0602) SpO2:  [86 %-93 %] 91 % (11/27 0602)  Intake/Output from previous day: 11/26 0701 - 11/27 0700 In: 1146 [P.O.:480; Blood:666] Out: 500 [Urine:500] Intake/Output this shift: No intake/output data recorded.   Recent Labs  08/28/16 0443 08/29/16 0425 08/30/16 0423  HGB 8.6* 7.8* 9.2*    Recent Labs  08/29/16 0425 08/30/16 0423  WBC 11.7* 13.6*  RBC 2.70* 3.17*  HCT 24.1* 26.9*  PLT 194 227    Recent Labs  08/29/16 0425 08/30/16 0423  NA 134* 133*  K 5.3* 4.8  CL 105 103  CO2 23 23  BUN 52* 71*  CREATININE 2.32* 2.36*  GLUCOSE 170* 136*  CALCIUM 8.4* 8.5*   No results for input(s): LABPT, INR in the last 72 hours.  Intact pulses distally Dorsiflexion/Plantar flexion intact Incision: dressing C/D/I No cellulitis present Compartment soft  Assessment/Plan: 3 Days Post-Op Procedure(s) (LRB): LEFT TOTAL KNEE REVISION WITH REMOVAL OF ANTIBIOTIC SPACER (Left) Up with therapy  Hold on discharge today and re-check labs tomorrow. Encourage fluid intake.  Marc HitchChristopher Y Vasquez Marc Vasquez 08/30/2016, 7:13 AM

## 2016-08-31 LAB — CBC
HCT: 27.6 % — ABNORMAL LOW (ref 39.0–52.0)
Hemoglobin: 9.2 g/dL — ABNORMAL LOW (ref 13.0–17.0)
MCH: 28.8 pg (ref 26.0–34.0)
MCHC: 33.3 g/dL (ref 30.0–36.0)
MCV: 86.5 fL (ref 78.0–100.0)
PLATELETS: 270 10*3/uL (ref 150–400)
RBC: 3.19 MIL/uL — ABNORMAL LOW (ref 4.22–5.81)
RDW: 14.2 % (ref 11.5–15.5)
WBC: 11.4 10*3/uL — ABNORMAL HIGH (ref 4.0–10.5)

## 2016-08-31 LAB — BASIC METABOLIC PANEL
Anion gap: 8 (ref 5–15)
BUN: 82 mg/dL — AB (ref 6–20)
CHLORIDE: 102 mmol/L (ref 101–111)
CO2: 24 mmol/L (ref 22–32)
CREATININE: 2.25 mg/dL — AB (ref 0.61–1.24)
Calcium: 8.7 mg/dL — ABNORMAL LOW (ref 8.9–10.3)
GFR calc Af Amer: 32 mL/min — ABNORMAL LOW (ref >60)
GFR, EST NON AFRICAN AMERICAN: 28 mL/min — AB (ref >60)
GLUCOSE: 50 mg/dL — AB (ref 65–99)
Potassium: 4.1 mmol/L (ref 3.5–5.1)
SODIUM: 134 mmol/L — AB (ref 135–145)

## 2016-08-31 LAB — GLUCOSE, CAPILLARY
GLUCOSE-CAPILLARY: 72 mg/dL (ref 65–99)
Glucose-Capillary: 129 mg/dL — ABNORMAL HIGH (ref 65–99)

## 2016-08-31 MED ORDER — CEPHALEXIN 500 MG PO CAPS: 500.0000 mg | ORAL_CAPSULE | Freq: Four times a day (QID) | ORAL | 0 refills | Status: DC

## 2016-08-31 MED ORDER — METHOCARBAMOL 500 MG PO TABS: 500.0000 mg | ORAL_TABLET | Freq: Four times a day (QID) | ORAL | 0 refills | Status: DC | PRN

## 2016-08-31 MED ORDER — ASPIRIN 325 MG PO TBEC: 325.0000 mg | DELAYED_RELEASE_TABLET | Freq: Two times a day (BID) | ORAL | 0 refills | Status: DC

## 2016-08-31 MED ORDER — OXYCODONE HCL 5 MG PO TABS: 5.0000 mg | ORAL_TABLET | ORAL | 0 refills | Status: DC | PRN

## 2016-08-31 NOTE — Care Management Important Message (Signed)
Important Message  Patient Details  Name: Marc SprinklesMichael R Vasquez MRN: [id number] Date of Birth: 03/18/1946   Medicare Important Message Given:  Yes    Haskell FlirtJamison, Reginia Battie 08/31/2016, 12:36 PMImportant Message  Patient Details  Name: Marc SprinklesMichael R Vasquez MRN: [id number] Date of Birth: 09/16/1946   Medicare Important Message Given:  Yes    Haskell FlirtJamison, Ashtin Melichar 08/31/2016, 12:36 PM

## 2016-08-31 NOTE — Discharge Summary (Signed)
Patient ID: Marc SprinklesMichael R Vasquez MRN: [id number] DOB/AGE: 71/01/1946 71 y.o.  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 Chronic kidney disease    stage 3 kidney failure  . Depression   . 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 Former smoker quit on 12/15/15  . Generalized OA   . History of left knee replacement   . History of stroke   . Hypercholesteremia   . Infection of prosthetic left knee joint (HCC)   . Left hip pain   . Lumbago   . OSA (obstructive sleep apnea)   . Pneumonia   . Poorly controlled diabetes mellitus (HCC)   . Scarlet fever     Surgeries: Procedure(s): LEFT TOTAL KNEE REVISION WITH REMOVAL OF ANTIBIOTIC SPACER on 08/27/2016   Consultants:   Discharged Condition: Improved  Hospital Course: Marc SprinklesMichael R Vasquez is an 70 y.o. male who was admitted 08/27/2016 for operative treatment of<principal problem not specified>. Patient has severe unremitting pain that affects sleep, daily activities, and work/hobbies. After pre-op clearance the patient was taken to the operating room on 08/27/2016 and underwent  Procedure(s): LEFT TOTAL KNEE REVISION WITH REMOVAL OF ANTIBIOTIC SPACER.    Patient was given perioperative antibiotics: Anti-infectives    Start     Dose/Rate Route Frequency Ordered Stop   08/31/16 0000  cephALEXin (KEFLEX) 500 MG capsule     500 mg Oral 4 times daily 08/31/16 0719     08/27/16 1400  ceFAZolin (ANCEF) IVPB 2g/100 mL premix     2 g 200 mL/hr over 30 Minutes Intravenous Every 6 hours 08/27/16 1312 08/27/16 2123   08/27/16 0600  ceFAZolin (ANCEF) 3 g in dextrose 5 % 50 mL IVPB     3 g 130 mL/hr over 30 Minutes Intravenous On call to O.R. 08/26/16 1409 08/27/16  0733       Patient was given sequential compression devices, early ambulation, and chemoprophylaxis to prevent DVT.  Patient benefited maximally from hospital stay and there were no complications.    Recent vital signs: Patient Vitals for the past 24 hrs:  BP Temp Temp src Pulse Resp SpO2  08/31/16 0505 (!) 155/67 98.3 F (36.8 C) Oral 94 16 93 %  08/30/16 2137 (!) 156/60 98.8 F (37.1 C) Oral (!) 105 16 93 %  08/30/16 1032 (!) 131/42 - - (!) 103 16 (!) 89 %  08/30/16 1030 - - - - - (!) 79 %     Recent laboratory studies:  Recent Labs  08/30/16 0423 08/31/16 0414  WBC 13.6* 11.4*  HGB 9.2* 9.2*  HCT 26.9* 27.6*  PLT 227 270  NA 133* 134*  K 4.8 4.1  CL 103 102  CO2 23 24  BUN 71* 82*  CREATININE 2.36* 2.25*  GLUCOSE 136* 50*  CALCIUM 8.5* 8.7*     Discharge Medications:     Medication List    TAKE these medications   acetaminophen 325 MG tablet Commonly known as:  TYLENOL Take 650 mg by mouth every 6 (six) hours as needed (for breakthrough pain).   amLODipine 10 MG tablet Commonly known as:  NORVASC Take 10 mg by mouth daily.   ARTIFICIAL TEAR OP Apply 1 drop to eye daily  as needed (dry eyes).   aspirin 325 MG EC tablet Take 1 tablet (325 mg total) by mouth 2 (two) times daily after a meal.   atorvastatin 80 MG tablet Commonly known as:  LIPITOR Take 80 mg by mouth daily.   cephALEXin 500 MG capsule Commonly known as:  KEFLEX Take 1 capsule (500 mg total) by mouth 4 (four) times daily.   clopidogrel 75 MG tablet Commonly known as:  PLAVIX Take 75 mg by mouth daily.   diphenhydrAMINE 12.5 MG/5ML elixir Commonly known as:  BENADRYL Take 5-10 mLs (12.5-25 mg total) by mouth every 4 (four) hours as needed for itching.   furosemide 80 MG tablet Commonly known as:  LASIX Take 80 mg by mouth daily. May take an additional 80mg s if swelling persists   HUMULIN R 500 UNIT/ML injection Generic drug:  insulin regular human CONCENTRATED Inject 20-130  Units into the skin See admin instructions. Use 130 units at breakfast, use 20 units at lunch, then use 70 units at dinner   HYDROcodone-acetaminophen 10-325 MG tablet Commonly known as:  NORCO Take 1 tablet by mouth every 6 (six) hours as needed for moderate pain or severe pain.   lisinopril-hydrochlorothiazide 10-12.5 MG tablet Commonly known as:  PRINZIDE,ZESTORETIC Take 1 tablet by mouth daily.   metFORMIN 500 MG 24 hr tablet Commonly known as:  GLUCOPHAGE-XR Take 1,000 mg by mouth 2 (two) times daily after a meal.   methocarbamol 500 MG tablet Commonly known as:  ROBAXIN Take 1 tablet (500 mg total) by mouth every 6 (six) hours as needed for muscle spasms.   metoCLOPramide 5 MG tablet Commonly known as:  REGLAN Take 1 tablet (5 mg total) by mouth every 8 (eight) hours as needed for nausea (if ondansetron (ZOFRAN) ineffective.).   multivitamin with minerals Tabs tablet Take 1 tablet by mouth daily.   oxyCODONE 5 MG immediate release tablet Commonly known as:  Oxy IR/ROXICODONE Take 1-2 tablets (5-10 mg total) by mouth every 4 (four) hours as needed for breakthrough pain.   saccharomyces boulardii 250 MG capsule Commonly known as:  FLORASTOR Take 1 capsule (250 mg total) by mouth 2 (two) times daily. What changed:  when to take this   tamsulosin 0.4 MG Caps capsule Commonly known as:  FLOMAX Take 0.8 mg by mouth at bedtime.   Vitamin D3 1000 units Caps Take 3,000 Units by mouth daily.       Diagnostic Studies: Dg Knee Left Port  Result Date: 08/27/2016 CLINICAL DATA:  Postoperative left total knee replacement. EXAM: PORTABLE LEFT KNEE - 1-2 VIEW COMPARISON:  05/28/2016 FINDINGS: Left total knee replacement identified. There is no evidence of subluxation or dislocation. No definite complicating features are identified. Postoperative changes are present. IMPRESSION: Left total knee replacement without complicating features identified. Electronically Signed   By:  Harmon PierJeffrey  Hu M.D.   On: 08/27/2016 11:06    Disposition: 03-Skilled Nursing Facility  Discharge Instructions    Discharge patient    Complete by:  As directed       Follow-up Information    Kathryne Hitchhristopher Y Endiya Klahr, MD Follow up in 2 week(s).   Specialty:  Orthopedic Surgery Contact information: [address] [place] [address] [phone]            Signed: Kathryne HitchChristopher Y Chena Chohan 08/31/2016, 7:19 AM

## 2016-08-31 NOTE — Clinical Social Work Placement (Signed)
   CLINICAL SOCIAL WORK PLACEMENT  NOTE  Date:  08/31/2016  Patient Details  Name: Marc SprinklesMichael R Grillo MRN: [id number] Date of Birth: 07/31/1946  Clinical Social Work is seeking post-discharge placement for this patient at the Skilled  Nursing Facility level of care (*CSW will initial, date and re-position this form in  chart as items are completed):  Yes   Patient/family provided with [facility] Clinical Social Work Department's list of facilities offering this level of care within the geographic area requested by the patient (or if unable, by the patient's family).  Yes   Patient/family informed of their freedom to choose among providers that offer the needed level of care, that participate in Medicare, Medicaid or managed care program needed by the patient, have an available bed and are willing to accept the patient.  Yes   Patient/family informed of [facility]'s ownership interest in [facility] and [facility], as well as of the fact that they are under no obligation to receive care at these facilities.  PASRR submitted to EDS on       PASRR number received on       Existing PASRR number confirmed on 08/29/16     FL2 transmitted to all facilities in geographic area requested by pt/family on 08/29/16     FL2 transmitted to all facilities within larger geographic area on       Patient informed that his/her managed care company has contracts with or will negotiate with certain facilities, including the following:        Yes   Patient/family informed of bed offers received.  Patient [facility] bed at [facility]     Physician recommends and patient chooses bed at      Patient to be transferred to [facility], [place] on 08/31/16.  Patient to be transferred to facility by [facility]     Patient family notified on 08/31/16 of transfer.  Name of family member notified:  Spouse     PHYSICIAN Please prepare priority discharge summary, including medications, Please prepare  prescriptions, Please sign FL2     Additional Comment: Pt / spouse are in agreement with d/c to [facility]  today. [facility] transport required. Medical necessity form completed. Pt / spouse are aware out of pocket costs may be associated with [facility] transport. D/C Summary sent to SNF for review. Scripts included in d/c packet . # for report provided to nsg.   _______________________________________________ Royetta AsalHaidinger, [facility] Lee, LCSW  [phone] 08/31/2016, 2:56 PM

## 2016-08-31 NOTE — Progress Notes (Signed)
Patient ID: Marc SprinklesMichael R Vasquez, male   DOB: 09/23/1946, 70 y.o.   MRN: [id number] [name] has improved overall.  H/H stable.  WBC and creatinine trending down.  Can be discharged to skilled nursing today.

## 2016-08-31 NOTE — Progress Notes (Signed)
Physical Therapy Treatment Patient Details Name: Marc SprinklesMichael R Vasquez MRN: [id number] DOB: 01/15/1946 Today's Date: 08/31/2016    History of Present Illness s/p LEFT TOTAL KNEE REVISION WITH REMOVAL OF ANTIBIOTIC SPACER (Left)    PT Comments    Performed knee ROM exercises with Mod encouragement. Sat EOB at least 8 minutes on today. Pt declined offer to attempt standing on today- "not today..I'm not ready yet." Pt is set to d/c to SNF later today.   Follow Up Recommendations  SNF     Equipment Recommendations  None recommended by PT    Recommendations for Other Services       Precautions / Restrictions Precautions Precautions: Fall;Knee Required Braces or Orthoses: Knee Immobilizer - Left Restrictions Weight Bearing Restrictions: No Other Position/Activity Restrictions: WBAT    Mobility  Bed Mobility Overal bed mobility: Needs Assistance Bed Mobility: Supine to Sit     Supine to sit: Max assist;+2 for physical assistance;+2 for safety/equipment;HOB elevated Sit to supine: Total assist;+2 for physical assistance;+2 for safety/equipment   General bed mobility comments: Assist for trunk and bil LEs. Increased time. Multimodal cues for safety, technique. Utilized bedpad for scooting, positioning.   Transfers                 General transfer comment: Nt-pt declined attempting standing on today  Ambulation/Gait                 Stairs            Wheelchair Mobility    Modified Rankin (Stroke Patients Only)       Balance                                    Cognition Arousal/Alertness: Awake/alert Behavior During Therapy: WFL for tasks assessed/performed Overall Cognitive Status: Within Functional Limits for tasks assessed                      Exercises Total Joint Exercises Ankle Circles/Pumps: AROM;Both;10 reps;Supine Quad Sets: AROM;Both;10 reps;Supine Heel Slides: AAROM;Left;10 reps;Supine Hip ABduction/ADduction:  AAROM;Left;10 reps;Supine Straight Leg Raises: AAROM;Left;10 reps;Supine Goniometric ROM: ~10-55 degrees    General Comments        Pertinent Vitals/Pain Pain Assessment: Faces Faces Pain Scale: Hurts even more Pain Location: L knee with activity Pain Descriptors / Indicators: Sore;Aching Pain Intervention(s): Limited activity within patient's tolerance;Repositioned;Ice applied    Home Living                      Prior Function            PT Goals (current goals can now be found in the care plan section) Progress towards PT goals: Progressing toward goals (very slowly)    Frequency    7X/week      PT Plan Current plan remains appropriate    Co-evaluation             End of Session   Activity Tolerance: Patient limited by fatigue;Patient limited by pain Patient left: in bed;with call bell/phone within reach;with family/visitor present;with bed alarm set     Time: 0945-1010 PT Time Calculation (min) (ACUTE ONLY): 25 min  Charges:  $Therapeutic Exercise: 8-22 mins $Therapeutic Activity: 8-22 mins                    G Codes:      Rebeca AlertJannie Kamala Kolton, MPT Pager: [phone]

## 2016-08-31 NOTE — Discharge Instructions (Signed)

## 2016-09-02 ENCOUNTER — Telehealth (INDEPENDENT_AMBULATORY_CARE_PROVIDER_SITE_OTHER): Payer: Self-pay | Admitting: Orthopaedic Surgery

## 2016-09-02 NOTE — Telephone Encounter (Signed)
They can just stop the keflex

## 2016-09-02 NOTE — Telephone Encounter (Signed)
Nurse aware of message

## 2016-09-02 NOTE — Telephone Encounter (Signed)
Please advise 

## 2016-09-06 ENCOUNTER — Telehealth (INDEPENDENT_AMBULATORY_CARE_PROVIDER_SITE_OTHER): Payer: Self-pay | Admitting: Orthopaedic Surgery

## 2016-09-06 NOTE — Telephone Encounter (Signed)
Please advise 

## 2016-09-06 NOTE — Telephone Encounter (Signed)
The dressing currently on his knee should stay on until his follow up unless it needs to be changed.  If it does, then new dry dressing as needed.

## 2016-09-06 NOTE — Telephone Encounter (Signed)
Facility aware.

## 2016-09-07 ENCOUNTER — Ambulatory Visit (INDEPENDENT_AMBULATORY_CARE_PROVIDER_SITE_OTHER): Payer: Medicare Other | Admitting: Orthopaedic Surgery

## 2016-09-09 ENCOUNTER — Ambulatory Visit (INDEPENDENT_AMBULATORY_CARE_PROVIDER_SITE_OTHER): Payer: Medicare Other | Admitting: Orthopaedic Surgery

## 2016-09-09 DIAGNOSIS — Z96652 Presence of left artificial knee joint: Secondary | ICD-10-CM

## 2016-09-09 NOTE — Progress Notes (Signed)
The patient is a 70 year old who is now 2 weeks status post revision arthroplasty of his left total knee. He is staying in a nursing care facility. He is making very slow progress with therapy but otherwise doing well.  On examination of his knee and remove the staples and placed Steri-Strips there is swelling to be expected as though there injection no redness at all.  At this point he'll continue nursing facility until a release in the home once his balance and coordination are better. From my standpoint it's up to he and his family as well as the therapist at the facility. I believe he has a meeting with the infectious disease specialists and Dr. Ilsa IhaSnyder next week. He is right now on Keflex 4 times a day.  He'll continue therapy and I'll see him back in a month to see how is doing overall but no x-rays are needed. I'll leave any further antibiotic treatment regimens up to the infectious disease specialist.

## 2016-09-14 ENCOUNTER — Ambulatory Visit (INDEPENDENT_AMBULATORY_CARE_PROVIDER_SITE_OTHER): Payer: Medicare Other | Admitting: Orthopaedic Surgery

## 2016-09-21 ENCOUNTER — Ambulatory Visit (INDEPENDENT_AMBULATORY_CARE_PROVIDER_SITE_OTHER): Payer: Medicare Other | Admitting: Internal Medicine

## 2016-09-21 ENCOUNTER — Encounter: Payer: Self-pay | Admitting: Internal Medicine

## 2016-09-21 VITALS — BP 129/57 | HR 75 | Temp 98.0°F | Ht 71.0 in | Wt 313.0 lb

## 2016-09-21 DIAGNOSIS — T8450XS Infection and inflammatory reaction due to unspecified internal joint prosthesis, sequela: Secondary | ICD-10-CM

## 2016-09-21 LAB — COMPLETE METABOLIC PANEL WITH GFR
ALBUMIN: 3.7 g/dL (ref 3.6–5.1)
ALK PHOS: 95 U/L (ref 40–115)
ALT: 13 U/L (ref 9–46)
AST: 16 U/L (ref 10–35)
BUN: 27 mg/dL — AB (ref 7–25)
CHLORIDE: 108 mmol/L (ref 98–110)
CO2: 24 mmol/L (ref 20–31)
Calcium: 9.1 mg/dL (ref 8.6–10.3)
Creat: 1.8 mg/dL — ABNORMAL HIGH (ref 0.70–1.18)
GFR, Est African American: 43 mL/min — ABNORMAL LOW (ref >=60)
GFR, Est Non African American: 37 mL/min — ABNORMAL LOW (ref >=60)
GLUCOSE: 108 mg/dL — AB (ref 65–99)
POTASSIUM: 5.7 mmol/L — AB (ref 3.5–5.3)
SODIUM: 140 mmol/L (ref 135–146)
Total Bilirubin: 0.5 mg/dL (ref 0.2–1.2)
Total Protein: 6.5 g/dL (ref 6.1–8.1)

## 2016-09-21 NOTE — Progress Notes (Signed)
RFV: follow up for [facility] PJI  Patient ID: Marc SprinklesMichael R Vasquez, male   DOB: 07/16/1946, 70 y.o.   MRN: [id number]  HPI  On 11/24 he underwent the removal of antibiotic spacer of left knee and underwent revision arthroplasty of left knee with revision of femoral component, tibial component and patellar component with depu bmt revision knee system with size 5 femure with press-fit stem, size 4 tibia with press-fit stem and 15 mm rotatint plaform polyethylene insert size 35 patellar button. He was discharged on cephalexin qid  He saw dr. Magnus IvanBlackman in the first week of December. Continue to rehab  He is now roughly 3 wk out from his revision arthroplasty of left total knee  Outpatient Encounter Prescriptions as of 09/21/2016  Medication Sig  . acetaminophen (TYLENOL) 325 MG tablet Take 650 mg by mouth every 6 (six) hours as needed (for breakthrough pain).  Marland Kitchen. amLODipine (NORVASC) 10 MG tablet Take 10 mg by mouth daily.  . ARTIFICIAL TEAR OP Apply 1 drop to eye daily as needed (dry eyes).  Marland Kitchen. aspirin EC 325 MG EC tablet Take 1 tablet (325 mg total) by mouth 2 (two) times daily after a meal.  . atorvastatin (LIPITOR) 80 MG tablet Take 80 mg by mouth daily.  . cephALEXin (KEFLEX) 500 MG capsule Take 1 capsule (500 mg total) by mouth 4 (four) times daily.  . Cholecalciferol (VITAMIN D3) 1000 units CAPS Take 3,000 Units by mouth daily.  . clopidogrel (PLAVIX) 75 MG tablet Take 75 mg by mouth daily.  . furosemide (LASIX) 80 MG tablet Take 80 mg by mouth daily. May take an additional 80mg s if swelling persists  . HYDROcodone-acetaminophen (NORCO) 10-325 MG tablet Take 1 tablet by mouth every 6 (six) hours as needed for moderate pain or severe pain.  Marland Kitchen. insulin regular human CONCENTRATED (HUMULIN R) 500 UNIT/ML injection Inject 20-130 Units into the skin See admin instructions. Use 130 units at breakfast, use 20 units at lunch, then use 70 units at dinner  . lisinopril-hydrochlorothiazide (PRINZIDE,ZESTORETIC)  10-12.5 MG tablet Take 1 tablet by mouth daily.  . metFORMIN (GLUCOPHAGE-XR) 500 MG 24 hr tablet Take 1,000 mg by mouth 2 (two) times daily after a meal.  . methocarbamol (ROBAXIN) 500 MG tablet Take 1 tablet (500 mg total) by mouth every 6 (six) hours as needed for muscle spasms.  . Multiple Vitamin (MULTIVITAMIN WITH MINERALS) TABS tablet Take 1 tablet by mouth daily.  Marland Kitchen. oxyCODONE (OXY IR/ROXICODONE) 5 MG immediate release tablet Take 1-2 tablets (5-10 mg total) by mouth every 4 (four) hours as needed for breakthrough pain.  . tamsulosin (FLOMAX) 0.4 MG CAPS capsule Take 0.8 mg by mouth at bedtime.   . diphenhydrAMINE (BENADRYL) 12.5 MG/5ML elixir Take 5-10 mLs (12.5-25 mg total) by mouth every 4 (four) hours as needed for itching. (Patient not taking: Reported on 09/21/2016)  . metoCLOPramide (REGLAN) 5 MG tablet Take 1 tablet (5 mg total) by mouth every 8 (eight) hours as needed for nausea (if ondansetron (ZOFRAN) ineffective.). (Patient not taking: Reported on 09/21/2016)  . saccharomyces boulardii (FLORASTOR) 250 MG capsule Take 1 capsule (250 mg total) by mouth 2 (two) times daily. (Patient not taking: Reported on 09/21/2016)   No facility-administered encounter medications on file as of 09/21/2016.      Patient Active Problem List   Diagnosis Date Noted  . 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 Status post revision of total replacement of left knee 08/27/2016  .  Septic arthritis (HCC) 05/28/2016  . Staph aureus infection 01/07/2016  . Infection of prosthetic left knee joint (HCC) 01/07/2016  . History of arthroplasty of left knee 01/07/2016  . Former smoker 01/07/2016  . Poorly controlled type 2 diabetes mellitus (HCC) 01/07/2016  . History of CVA (cerebrovascular accident) 01/07/2016  . Diabetic neuropathy (HCC) 01/07/2016  . Essential hypertension 01/07/2016     Health Maintenance Due  Topic Date Due  . Hepatitis C Screening  1946/06/18  .  FOOT EXAM  07/08/1955  . OPHTHALMOLOGY EXAM  07/08/1955  . TETANUS/TDAP  07/07/1964  . COLONOSCOPY  07/08/1995  . ZOSTAVAX  07/07/2005  . PNA vac Low Risk Adult (1 of 2 - PCV13) 07/07/2010     Review of Systems  Physical Exam   BP (!) 129/57   Pulse 75   Temp 98 F (36.7 C) (Oral)   Ht 5\' 11"  (1.803 m)   Wt (!) 313 lb (142 kg)   BMI 43.65 kg/m  Gen= a xo by 3 in NAD Pulm = CTAB no w/c/r Heart = nl s1,s2 no G/M?r Ext :Left knee healing Skin =left lower leg chronic venous stasis changes  CBC Lab Results  Component Value Date   WBC 11.4 (H) 08/31/2016   RBC 3.19 (L) 08/31/2016   HGB 9.2 (L) 08/31/2016   HCT 27.6 (L) 08/31/2016   PLT 270 08/31/2016   MCV 86.5 08/31/2016   MCH 28.8 08/31/2016   MCHC 33.3 08/31/2016   RDW 14.2 08/31/2016   LYMPHSABS 1,743 07/15/2016   MONOABS 664 07/15/2016   EOSABS 332 07/15/2016   BASOSABS 83 07/15/2016   BMET Lab Results  Component Value Date   NA 134 (L) 08/31/2016   K 4.1 08/31/2016   CL 102 08/31/2016   CO2 24 08/31/2016   GLUCOSE 50 (L) 08/31/2016   BUN 82 (H) 08/31/2016   CREATININE 2.25 (H) 08/31/2016   CALCIUM 8.7 (L) 08/31/2016   GFRNONAA 28 (L) 08/31/2016   GFRAA 32 (L) 08/31/2016   Lab Results  Component Value Date   ESRSEDRATE 65 (H) 09/21/2016   Lab Results  Component Value Date   CRP 9.7 (H) 09/21/2016     Assessment and Plan  Prosthetic joint infection = continue on cephalexin 500mg  QID plan to treat for 6 months but may be longer for chronic suppression. We will check inflammatory markers.  rtc in 6-8 wk

## 2016-09-22 LAB — SEDIMENTATION RATE: Sed Rate: 65 mm/hr — ABNORMAL HIGH (ref 0–20)

## 2016-09-22 LAB — C-REACTIVE PROTEIN: CRP: 9.7 mg/L — ABNORMAL HIGH (ref <8.0)

## 2016-10-04 DIAGNOSIS — N183 Chronic kidney disease, stage 3 (moderate): Secondary | ICD-10-CM | POA: Diagnosis not present

## 2016-10-04 DIAGNOSIS — F339 Major depressive disorder, recurrent, unspecified: Secondary | ICD-10-CM | POA: Diagnosis not present

## 2016-10-04 DIAGNOSIS — Z471 Aftercare following joint replacement surgery: Secondary | ICD-10-CM | POA: Diagnosis not present

## 2016-10-04 DIAGNOSIS — E1121 Type 2 diabetes mellitus with diabetic nephropathy: Secondary | ICD-10-CM | POA: Diagnosis not present

## 2016-10-04 DIAGNOSIS — Z96652 Presence of left artificial knee joint: Secondary | ICD-10-CM | POA: Diagnosis not present

## 2016-10-04 DIAGNOSIS — Z8673 Personal history of transient ischemic attack (TIA), and cerebral infarction without residual deficits: Secondary | ICD-10-CM | POA: Diagnosis not present

## 2016-10-11 ENCOUNTER — Ambulatory Visit (INDEPENDENT_AMBULATORY_CARE_PROVIDER_SITE_OTHER): Payer: Medicare Other | Admitting: Physician Assistant

## 2016-10-11 ENCOUNTER — Encounter (INDEPENDENT_AMBULATORY_CARE_PROVIDER_SITE_OTHER): Payer: Self-pay | Admitting: Physician Assistant

## 2016-10-11 DIAGNOSIS — Z96652 Presence of left artificial knee joint: Secondary | ICD-10-CM

## 2016-10-11 MED ORDER — METHOCARBAMOL 500 MG PO TABS: 500.0000 mg | ORAL_TABLET | Freq: Three times a day (TID) | ORAL | 1 refills | Status: DC

## 2016-10-11 MED ORDER — HYDROCODONE-ACETAMINOPHEN 10-325 MG PO TABS: 1.0000 | ORAL_TABLET | Freq: Four times a day (QID) | ORAL | 0 refills | Status: DC | PRN

## 2016-10-11 NOTE — Progress Notes (Signed)
Office Visit Note   Patient: Marc Vasquez           Date of Birth: 1946-03-24           MRN: [id number] Visit Date: 10/11/2016              Requested by: Shelbie Ammons, MD [address] [place], [place] [address] PCP: Galvin Proffer, MD   Assessment & Plan: Visit Diagnoses:  1. Status post total left knee replacement     Plan: Start outpatient physical therapy to work on his range of motion strengthening of the left knee. He'll follow-up in one month check his range of motion and progress.  Follow-Up Instructions: Return in about 4 weeks (around 11/08/2016) for post op DR. BLACKMAN.   Orders:  No orders of the defined types were placed in this encounter.  Meds ordered this encounter  Medications  . HYDROcodone-acetaminophen (NORCO) 10-325 MG tablet    Sig: Take 1 tablet by mouth every 6 (six) hours as needed.    Dispense:  30 tablet    Refill:  0  . methocarbamol (ROBAXIN) 500 MG tablet    Sig: Take 1 tablet (500 mg total) by mouth 3 (three) times daily.    Dispense:  40 tablet    Refill:  1      Procedures: No procedures performed   Clinical Data: No additional findings.   Subjective: Chief Complaint  Patient presents with  . Left Knee - Routine Post Op  . Follow-up    HPI Mr. Lastinger returns today now 6 weeks status post left total knee arthroplasty revision. Continues to have some pain in the knee is requesting refill on his Norco. He is to start physical therapy this week for range of motion strengthening knee. He's had no fevers or chills. Review of Systems See history of present illness  Objective: Vital Signs: There were no vitals taken for this visit.  Physical Exam  Ortho Exam: Near full extension of the left knee flexes to 110 . Surgical incision healing well no signs of infection. Calf supple and  nontender.  Specialty Comments:  No specialty comments available.  Imaging: No results found.   PMFS History: Patient Active Problem List    Diagnosis Date Noted  . 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 Status post revision of total replacement of left knee 08/27/2016  . Septic arthritis (HCC) 05/28/2016  . Staph aureus infection 01/07/2016  . Infection of prosthetic left knee joint (HCC) 01/07/2016  . History of arthroplasty of left knee 01/07/2016  . Former smoker 01/07/2016  . Poorly controlled type 2 diabetes mellitus (HCC) 01/07/2016  . History of CVA (cerebrovascular accident) 01/07/2016  . Diabetic neuropathy (HCC) 01/07/2016  . Essential hypertension 01/07/2016   Past Medical History:  Diagnosis Date  . Chronic kidney disease    stage 3 kidney failure  . Depression   . 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 Former smoker quit on 12/15/15  . Generalized OA   . History of left knee replacement   . History of stroke   . Hypercholesteremia   . Infection of prosthetic left knee joint (HCC)   . Left hip pain   . Lumbago   . OSA (obstructive sleep apnea)   . Pneumonia   . Poorly controlled diabetes mellitus (HCC)   . Scarlet fever     Family History  Problem  Relation Age of Onset  . Heart disease    . Diabetes      Past Surgical History:  Procedure Laterality Date  . EYE SURGERY     broken blood vessels, catarcts bil  . JOINT REPLACEMENT    . left foot surgery     stepped on thorn as child and got infected  . TEE WITHOUT CARDIOVERSION N/A 05/31/2016   Procedure: TRANSESOPHAGEAL ECHOCARDIOGRAM (TEE);  Surgeon: Jake BatheMark C Skains, MD;  [facility];  Service: Cardiovascular;  Laterality: N/A;  . TOE AMPUTATION     partial toe amputation  . TOTAL KNEE REVISION Left 05/28/2016   Procedure: REMOVAL OF ALL COMPONENTS OF LEFT TOTAL KNEE AND PLACEMENT OF ANTIBIOTIC SPACER;  Surgeon: Kathryne Hitchhristopher Y Blackman, MD;  Location: [facility] OR;  Service: Orthopedics;  Laterality: Left;  . TOTAL KNEE REVISION Left 08/27/2016    Procedure: LEFT TOTAL KNEE REVISION WITH REMOVAL OF ANTIBIOTIC SPACER;  Surgeon: Kathryne Hitchhristopher Y Blackman, MD;  Location: [facility] ORS;  Service: Orthopedics;  Laterality: Left;   Social History   Occupational History  . retired Visual merchandiserfarmer   . former veteran    Social History Main Topics  . Smoking status: Former Smoker    Packs/day: 1.00    Years: 15.00    Types: Cigarettes    Quit date: 12/15/2015  . Smokeless tobacco: Never Used  . Alcohol use 0.6 oz/week    1 Standard drinks or equivalent per week     Comment: rarely  . Drug use: No  . Sexual activity: Yes    Partners: Female     Comment: married , lives with his wife

## 2016-10-12 ENCOUNTER — Telehealth: Payer: Self-pay | Admitting: *Deleted

## 2016-10-12 ENCOUNTER — Ambulatory Visit: Payer: Medicare Other | Admitting: Internal Medicine

## 2016-10-12 NOTE — Telephone Encounter (Signed)
Patient wife called and advised he has been sick all morning and will not make the appt today. She wanted to reschedule the appt. Dr Drue SecondSnider is on clinic today so let her know what is going on and she advised to take him to the ED as he may get dehydrated and could cause renal failureor other issues. Mrs Atha advised patient refused and she can not make him. Advised her to pay attention to him and if he shows symptoms of dehydration, slurred or nonsense speech she will need to call 911.

## 2016-10-13 DIAGNOSIS — M62552 Muscle wasting and atrophy, not elsewhere classified, left thigh: Secondary | ICD-10-CM | POA: Diagnosis not present

## 2016-10-13 DIAGNOSIS — M6281 Muscle weakness (generalized): Secondary | ICD-10-CM | POA: Diagnosis not present

## 2016-10-13 DIAGNOSIS — R2689 Other abnormalities of gait and mobility: Secondary | ICD-10-CM | POA: Diagnosis not present

## 2016-10-13 DIAGNOSIS — M62551 Muscle wasting and atrophy, not elsewhere classified, right thigh: Secondary | ICD-10-CM | POA: Diagnosis not present

## 2016-10-14 NOTE — Telephone Encounter (Signed)
Recovered from vomiting spell which he thinks maybe due to combo of oj, milk, and pain meds

## 2016-10-15 DIAGNOSIS — M62552 Muscle wasting and atrophy, not elsewhere classified, left thigh: Secondary | ICD-10-CM | POA: Diagnosis not present

## 2016-10-15 DIAGNOSIS — M6281 Muscle weakness (generalized): Secondary | ICD-10-CM | POA: Diagnosis not present

## 2016-10-15 DIAGNOSIS — R2689 Other abnormalities of gait and mobility: Secondary | ICD-10-CM | POA: Diagnosis not present

## 2016-10-15 DIAGNOSIS — M62551 Muscle wasting and atrophy, not elsewhere classified, right thigh: Secondary | ICD-10-CM | POA: Diagnosis not present

## 2016-10-19 DIAGNOSIS — M9902 Segmental and somatic dysfunction of thoracic region: Secondary | ICD-10-CM | POA: Diagnosis not present

## 2016-10-19 DIAGNOSIS — M9903 Segmental and somatic dysfunction of lumbar region: Secondary | ICD-10-CM | POA: Diagnosis not present

## 2016-10-19 DIAGNOSIS — M62830 Muscle spasm of back: Secondary | ICD-10-CM | POA: Diagnosis not present

## 2016-10-19 DIAGNOSIS — M5416 Radiculopathy, lumbar region: Secondary | ICD-10-CM | POA: Diagnosis not present

## 2016-10-19 DIAGNOSIS — M9905 Segmental and somatic dysfunction of pelvic region: Secondary | ICD-10-CM | POA: Diagnosis not present

## 2016-10-25 DIAGNOSIS — M6281 Muscle weakness (generalized): Secondary | ICD-10-CM | POA: Diagnosis not present

## 2016-10-25 DIAGNOSIS — M62552 Muscle wasting and atrophy, not elsewhere classified, left thigh: Secondary | ICD-10-CM | POA: Diagnosis not present

## 2016-10-25 DIAGNOSIS — R2689 Other abnormalities of gait and mobility: Secondary | ICD-10-CM | POA: Diagnosis not present

## 2016-10-25 DIAGNOSIS — M62551 Muscle wasting and atrophy, not elsewhere classified, right thigh: Secondary | ICD-10-CM | POA: Diagnosis not present

## 2016-10-29 DIAGNOSIS — M62551 Muscle wasting and atrophy, not elsewhere classified, right thigh: Secondary | ICD-10-CM | POA: Diagnosis not present

## 2016-10-29 DIAGNOSIS — M62552 Muscle wasting and atrophy, not elsewhere classified, left thigh: Secondary | ICD-10-CM | POA: Diagnosis not present

## 2016-10-29 DIAGNOSIS — M6281 Muscle weakness (generalized): Secondary | ICD-10-CM | POA: Diagnosis not present

## 2016-10-29 DIAGNOSIS — R2689 Other abnormalities of gait and mobility: Secondary | ICD-10-CM | POA: Diagnosis not present

## 2016-11-01 DIAGNOSIS — R2689 Other abnormalities of gait and mobility: Secondary | ICD-10-CM | POA: Diagnosis not present

## 2016-11-01 DIAGNOSIS — M62551 Muscle wasting and atrophy, not elsewhere classified, right thigh: Secondary | ICD-10-CM | POA: Diagnosis not present

## 2016-11-01 DIAGNOSIS — M62552 Muscle wasting and atrophy, not elsewhere classified, left thigh: Secondary | ICD-10-CM | POA: Diagnosis not present

## 2016-11-01 DIAGNOSIS — M6281 Muscle weakness (generalized): Secondary | ICD-10-CM | POA: Diagnosis not present

## 2016-11-03 DIAGNOSIS — M5442 Lumbago with sciatica, left side: Secondary | ICD-10-CM | POA: Diagnosis not present

## 2016-11-03 DIAGNOSIS — M9904 Segmental and somatic dysfunction of sacral region: Secondary | ICD-10-CM | POA: Diagnosis not present

## 2016-11-03 DIAGNOSIS — M546 Pain in thoracic spine: Secondary | ICD-10-CM | POA: Diagnosis not present

## 2016-11-03 DIAGNOSIS — M5136 Other intervertebral disc degeneration, lumbar region: Secondary | ICD-10-CM | POA: Diagnosis not present

## 2016-11-03 DIAGNOSIS — M9902 Segmental and somatic dysfunction of thoracic region: Secondary | ICD-10-CM | POA: Diagnosis not present

## 2016-11-03 DIAGNOSIS — M9903 Segmental and somatic dysfunction of lumbar region: Secondary | ICD-10-CM | POA: Diagnosis not present

## 2016-11-03 DIAGNOSIS — M62830 Muscle spasm of back: Secondary | ICD-10-CM | POA: Diagnosis not present

## 2016-11-09 ENCOUNTER — Ambulatory Visit (INDEPENDENT_AMBULATORY_CARE_PROVIDER_SITE_OTHER): Payer: Medicare Other | Admitting: Internal Medicine

## 2016-11-09 ENCOUNTER — Encounter: Payer: Self-pay | Admitting: Internal Medicine

## 2016-11-09 ENCOUNTER — Inpatient Hospital Stay (INDEPENDENT_AMBULATORY_CARE_PROVIDER_SITE_OTHER): Payer: Medicare Other | Admitting: Orthopaedic Surgery

## 2016-11-09 ENCOUNTER — Other Ambulatory Visit: Payer: Self-pay | Admitting: *Deleted

## 2016-11-09 VITALS — BP 138/68 | HR 99 | Temp 97.9°F | Wt 306.0 lb

## 2016-11-09 DIAGNOSIS — T8450XS Infection and inflammatory reaction due to unspecified internal joint prosthesis, sequela: Secondary | ICD-10-CM | POA: Diagnosis not present

## 2016-11-09 DIAGNOSIS — D72829 Elevated white blood cell count, unspecified: Secondary | ICD-10-CM

## 2016-11-09 DIAGNOSIS — N183 Chronic kidney disease, stage 3 unspecified: Secondary | ICD-10-CM

## 2016-11-09 DIAGNOSIS — K921 Melena: Secondary | ICD-10-CM | POA: Diagnosis not present

## 2016-11-09 DIAGNOSIS — A4901 Methicillin susceptible Staphylococcus aureus infection, unspecified site: Secondary | ICD-10-CM

## 2016-11-09 LAB — CBC WITH DIFFERENTIAL/PLATELET
BASOS ABS: 78 {cells}/uL (ref 0–200)
Basophils Relative: 1 %
Eosinophils Absolute: 156 cells/uL (ref 15–500)
Eosinophils Relative: 2 %
HCT: 36.4 % — ABNORMAL LOW (ref 38.5–50.0)
HEMOGLOBIN: 11.6 g/dL — AB (ref 13.2–17.1)
LYMPHS ABS: 2028 {cells}/uL (ref 850–3900)
Lymphocytes Relative: 26 %
MCH: 28.9 pg (ref 27.0–33.0)
MCHC: 31.9 g/dL — AB (ref 32.0–36.0)
MCV: 90.8 fL (ref 80.0–100.0)
MONOS PCT: 7 %
MPV: 9.1 fL (ref 7.5–12.5)
Monocytes Absolute: 546 cells/uL (ref 200–950)
NEUTROS ABS: 4992 {cells}/uL (ref 1500–7800)
NEUTROS PCT: 64 %
PLATELETS: 284 10*3/uL (ref 140–400)
RBC: 4.01 MIL/uL — AB (ref 4.20–5.80)
RDW: 15.1 % — ABNORMAL HIGH (ref 11.0–15.0)
WBC: 7.8 10*3/uL (ref 3.8–10.8)

## 2016-11-09 LAB — BASIC METABOLIC PANEL
BUN: 42 mg/dL — ABNORMAL HIGH (ref 7–25)
CHLORIDE: 106 mmol/L (ref 98–110)
CO2: 24 mmol/L (ref 20–31)
Calcium: 9.6 mg/dL (ref 8.6–10.3)
Creat: 1.63 mg/dL — ABNORMAL HIGH (ref 0.70–1.18)
Glucose, Bld: 139 mg/dL — ABNORMAL HIGH (ref 65–99)
POTASSIUM: 5 mmol/L (ref 3.5–5.3)
Sodium: 141 mmol/L (ref 135–146)

## 2016-11-09 MED ORDER — CEPHALEXIN 500 MG PO CAPS: 500.0000 mg | ORAL_CAPSULE | Freq: Three times a day (TID) | ORAL | 3 refills | Status: DC

## 2016-11-09 MED ORDER — CEPHALEXIN 500 MG PO CAPS: 500.0000 mg | ORAL_CAPSULE | Freq: Three times a day (TID) | ORAL | 0 refills | Status: DC

## 2016-11-09 NOTE — Progress Notes (Signed)
RFV: pji   Patient ID: Marc Vasquez, male   DOB: Feb 25, 1946, 71 y.o.   MRN: [id number]  HPI Marc Vasquez has hx of complicated MSSA bacteremia and prosthetic joint infection. He underwent removal of abtx spacer with revision arthroplasty on 11/24, discharged on 6 months of cephalexin. He was seen beginning of January at dr blackman's office recommended to continue pt on his knee to strengthen his left knee.  He stopped PT since it worsened his back pain  Lab Results  Component Value Date   ESRSEDRATE 65 (H) 09/21/2016   Lab Results  Component Value Date   CRP 9.7 (H) 09/21/2016     Outpatient Encounter Prescriptions as of 11/09/2016  Medication Sig  . acetaminophen (TYLENOL) 325 MG tablet Take 650 mg by mouth every 6 (six) hours as needed (for breakthrough pain).  Marland Kitchen amLODipine (NORVASC) 10 MG tablet Take 10 mg by mouth daily.  . ARTIFICIAL TEAR OP Apply 1 drop to eye daily as needed (dry eyes).  Marland Kitchen aspirin EC 325 MG EC tablet Take 1 tablet (325 mg total) by mouth 2 (two) times daily after a meal.  . atorvastatin (LIPITOR) 80 MG tablet Take 80 mg by mouth daily.  . cephALEXin (KEFLEX) 500 MG capsule Take 1 capsule (500 mg total) by mouth 4 (four) times daily.  . Cholecalciferol (VITAMIN D3) 1000 units CAPS Take 3,000 Units by mouth daily.  . clopidogrel (PLAVIX) 75 MG tablet Take 75 mg by mouth daily.  . diphenhydrAMINE (BENADRYL) 12.5 MG/5ML elixir Take 5-10 mLs (12.5-25 mg total) by mouth every 4 (four) hours as needed for itching.  . furosemide (LASIX) 80 MG tablet Take 80 mg by mouth daily. May take an additional 80mg s if swelling persists  . HYDROcodone-acetaminophen (NORCO) 10-325 MG tablet Take 1 tablet by mouth every 6 (six) hours as needed.  . insulin regular human CONCENTRATED (HUMULIN R) 500 UNIT/ML injection Inject 20-130 Units into the skin See admin instructions. Use 130 units at breakfast, use 20 units at lunch, then use 70 units at dinner  . lisinopril-hydrochlorothiazide  (PRINZIDE,ZESTORETIC) 10-12.5 MG tablet Take 1 tablet by mouth daily.  . metFORMIN (GLUCOPHAGE-XR) 500 MG 24 hr tablet Take 1,000 mg by mouth 2 (two) times daily after a meal.  . methocarbamol (ROBAXIN) 500 MG tablet Take 1 tablet (500 mg total) by mouth 3 (three) times daily.  . Multiple Vitamin (MULTIVITAMIN WITH MINERALS) TABS tablet Take 1 tablet by mouth daily.  Marland Kitchen saccharomyces boulardii (FLORASTOR) 250 MG capsule Take 1 capsule (250 mg total) by mouth 2 (two) times daily.  . tamsulosin (FLOMAX) 0.4 MG CAPS capsule Take 0.8 mg by mouth at bedtime.   . [DISCONTINUED] metoCLOPramide (REGLAN) 5 MG tablet Take 1 tablet (5 mg total) by mouth every 8 (eight) hours as needed for nausea (if ondansetron (ZOFRAN) ineffective.). (Patient not taking: Reported on 09/21/2016)  . [DISCONTINUED] oxyCODONE (OXY IR/ROXICODONE) 5 MG immediate release tablet Take 1-2 tablets (5-10 mg total) by mouth every 4 (four) hours as needed for breakthrough pain.   No facility-administered encounter medications on file as of 11/09/2016.      Patient Active Problem List   Diagnosis Date Noted  . 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 Status post revision of total replacement of left knee 08/27/2016  . Septic arthritis (HCC) 05/28/2016  . Staph aureus infection 01/07/2016  . Infection of prosthetic left knee joint (HCC) 01/07/2016  . History of arthroplasty of left knee  01/07/2016  . Former smoker 01/07/2016  . Poorly controlled type 2 diabetes mellitus (HCC) 01/07/2016  . History of CVA (cerebrovascular accident) 01/07/2016  . Diabetic neuropathy (HCC) 01/07/2016  . Essential hypertension 01/07/2016     Health Maintenance Due  Topic Date Due  . Hepatitis C Screening  Aug 01, 1946  . FOOT EXAM  07/08/1955  . OPHTHALMOLOGY EXAM  07/08/1955  . TETANUS/TDAP  07/07/1964  . COLONOSCOPY  07/08/1995  . ZOSTAVAX  07/07/2005  . PNA vac Low Risk Adult (1 of 2 - PCV13) 07/07/2010      Review of Systems Still have knee pain, and back pain when doing PT. No fever, chills, nightsweats, diarrhea, occasional candidal groin skin infection. He also notices occ dark stool Physical Exam   BP 138/68   Pulse 99   Temp 97.9 F (36.6 C) (Oral)   Wt (!) 306 lb (138.8 kg)   BMI 42.68 kg/m   Physical Exam  Constitutional: He is oriented to person, place, and time. He appears well-developed and well-nourished. No distress.  HENT:  Mouth/Throat: Oropharynx is clear and moist. No oropharyngeal exudate.  Cardiovascular: Normal rate, regular rhythm and normal heart sounds. Exam reveals no gallop and no friction rub.  No murmur heard.  Pulmonary/Chest: Effort normal and breath sounds normal. No respiratory distress. He has no wheezes.  Ext:  Skin: Skin is warm and dry. No rash noted. No erythema.  Psychiatric: He has a normal mood and affect. His behavior is normal.    CBC Lab Results  Component Value Date   WBC 11.4 (H) 08/31/2016   RBC 3.19 (L) 08/31/2016   HGB 9.2 (L) 08/31/2016   HCT 27.6 (L) 08/31/2016   PLT 270 08/31/2016   MCV 86.5 08/31/2016   MCH 28.8 08/31/2016   MCHC 33.3 08/31/2016   RDW 14.2 08/31/2016   LYMPHSABS 1,743 07/15/2016   MONOABS 664 07/15/2016   EOSABS 332 07/15/2016    BMET Lab Results  Component Value Date   NA 140 09/21/2016   K 5.7 (H) 09/21/2016   CL 108 09/21/2016   CO2 24 09/21/2016   GLUCOSE 108 (H) 09/21/2016   BUN 27 (H) 09/21/2016   CREATININE 1.80 (H) 09/21/2016   CALCIUM 9.1 09/21/2016   GFRNONAA 37 (L) 09/21/2016   GFRAA 43 (L) 09/21/2016      Assessment and Plan   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 Will check hgb/hct. Give guaiac cards  Limited range of motion to left knee = have him ask dr Magnus Ivanblackman what other modified PT he can do that won't  exacerbate back pain

## 2016-11-10 LAB — SEDIMENTATION RATE: SED RATE: 53 mm/h — AB (ref 0–20)

## 2016-11-10 LAB — C-REACTIVE PROTEIN: CRP: 6.4 mg/L (ref <8.0)

## 2016-11-16 ENCOUNTER — Encounter (INDEPENDENT_AMBULATORY_CARE_PROVIDER_SITE_OTHER): Payer: Self-pay

## 2016-11-16 ENCOUNTER — Ambulatory Visit (INDEPENDENT_AMBULATORY_CARE_PROVIDER_SITE_OTHER): Payer: Medicare Other | Admitting: Orthopaedic Surgery

## 2016-11-16 DIAGNOSIS — Z96652 Presence of left artificial knee joint: Secondary | ICD-10-CM

## 2016-11-16 DIAGNOSIS — T8454XD Infection and inflammatory reaction due to internal left knee prosthesis, subsequent encounter: Secondary | ICD-10-CM

## 2016-11-16 NOTE — Progress Notes (Signed)
The patient is now just under 3 months status post revision arthroplasty of an infected left total knee replacement. This knee replacement was done elsewhere. When it became infected he was temporize with an I&D and poly-exchange elsewhere. [facility] performed an excision arthroplasty and placement of antibiotic spacer. We then eventually take him at the end of November for revision arthroplasty. He is followed closely by the infectious disease clinics and Dr. Ilsa IhaSnyder. He's recently had his labs drawn there including a CBC a sedimentation rate and CRP. He reports that he is doing well overall.  On examination of his left knee there is no redness around the knee. It is not significantly warm at all. He has good range of motion overall. I did review all the infectious parameter labs and they're all elevated but looks like they could be trending in the right direction.  From my standpoint as well as and also I would do right now acutely. I would like to see him back in 3 months with a repeat AP and lateral of his left knee. At that point likely infectious labs of be drawn again to hopefully continue to document a positive trend. Obviously if he has any problems with a now 3 months the family knows to bring him in.

## 2017-02-08 ENCOUNTER — Ambulatory Visit (INDEPENDENT_AMBULATORY_CARE_PROVIDER_SITE_OTHER): Payer: Medicare Other | Admitting: Internal Medicine

## 2017-02-08 ENCOUNTER — Encounter: Payer: Self-pay | Admitting: Internal Medicine

## 2017-02-08 VITALS — BP 137/74 | HR 69 | Temp 98.0°F | Wt 325.0 lb

## 2017-02-08 DIAGNOSIS — A4901 Methicillin susceptible Staphylococcus aureus infection, unspecified site: Secondary | ICD-10-CM

## 2017-02-08 DIAGNOSIS — N183 Chronic kidney disease, stage 3 unspecified: Secondary | ICD-10-CM

## 2017-02-08 DIAGNOSIS — T8450XS Infection and inflammatory reaction due to unspecified internal joint prosthesis, sequela: Secondary | ICD-10-CM | POA: Diagnosis not present

## 2017-02-08 DIAGNOSIS — G8929 Other chronic pain: Secondary | ICD-10-CM

## 2017-02-08 DIAGNOSIS — M545 Low back pain: Secondary | ICD-10-CM

## 2017-02-08 NOTE — Progress Notes (Signed)
rfv: follow p for pji  Patient ID: Marc Vasquez, male   DOB: 07/29/1946, 71 y.o.   MRN: [id number]  HPI 71yo M with history of MSSA PJI of Left TKA. He initially had I X D with poly-exchange and IV abtx at [place] but eventually needed excision arthroplasty and abtx spacer. He had his revision arhtroplasty in Aug 27 2016 and given 6 months of cephalexin which he has nearly completed. He continues to work as much as he can with ambulation but still uses walker, predominantly since he is noticing low back pain. Has been ongoing but worse of late. Would like referral to back specialist.  He has not had any mri of spine, even when he was admitted for sepsis 2/2 mssa bacteremia.  I have reviewed his records in [place] link  Social History  Substance Use Topics  . Smoking status: Former Smoker    Packs/day: 1.00    Years: 15.00    Types: Cigarettes    Quit date: 12/15/2015  . Smokeless tobacco: Never Used  . Alcohol use 0.6 oz/week    1 Standard drinks or equivalent per week     Comment: rarely    Lab Results  Component Value Date   ESRSEDRATE 53 (H) 11/09/2016   Lab Results  Component Value Date   CRP 6.4 11/09/2016     Outpatient Encounter Prescriptions as of 02/08/2017  Medication Sig  . acetaminophen (TYLENOL) 325 MG tablet Take 650 mg by mouth every 6 (six) hours as needed (for breakthrough pain).  Marland Kitchen. amLODipine (NORVASC) 10 MG tablet Take 10 mg by mouth daily.  . ARTIFICIAL TEAR OP Apply 1 drop to eye daily as needed (dry eyes).  Marland Kitchen. aspirin EC 325 MG EC tablet Take 1 tablet (325 mg total) by mouth 2 (two) times daily after a meal. (Patient not taking: Reported on 02/08/2017)  . atorvastatin (LIPITOR) 80 MG tablet Take 80 mg by mouth daily.  . cephALEXin (KEFLEX) 500 MG capsule Take 1 capsule (500 mg total) by mouth 3 (three) times daily.  . Cholecalciferol (VITAMIN D3) 1000 units CAPS Take 3,000 Units by mouth daily.  . clopidogrel (PLAVIX) 75 MG tablet Take 75 mg by  mouth daily.  . diphenhydrAMINE (BENADRYL) 12.5 MG/5ML elixir Take 5-10 mLs (12.5-25 mg total) by mouth every 4 (four) hours as needed for itching. (Patient not taking: Reported on 02/08/2017)  . furosemide (LASIX) 80 MG tablet Take 80 mg by mouth daily. May take an additional 80mg s if swelling persists  . HYDROcodone-acetaminophen (NORCO) 10-325 MG tablet Take 1 tablet by mouth every 6 (six) hours as needed. (Patient not taking: Reported on 02/08/2017)  . insulin regular human CONCENTRATED (HUMULIN R) 500 UNIT/ML injection Inject 20-130 Units into the skin See admin instructions. Use 130 units at breakfast, use 20 units at lunch, then use 70 units at dinner  . lisinopril-hydrochlorothiazide (PRINZIDE,ZESTORETIC) 10-12.5 MG tablet Take 1 tablet by mouth daily.  . metFORMIN (GLUCOPHAGE-XR) 500 MG 24 hr tablet Take 1,000 mg by mouth 2 (two) times daily after a meal.  . methocarbamol (ROBAXIN) 500 MG tablet Take 1 tablet (500 mg total) by mouth 3 (three) times daily.  . Multiple Vitamin (MULTIVITAMIN WITH MINERALS) TABS tablet Take 1 tablet by mouth daily.  Marland Kitchen. saccharomyces boulardii (FLORASTOR) 250 MG capsule Take 1 capsule (250 mg total) by mouth 2 (two) times daily.  . tamsulosin (FLOMAX) 0.4 MG CAPS capsule Take 0.8 mg by mouth at bedtime.    No facility-administered  encounter medications on file as of 02/08/2017.      Patient Active Problem List   Diagnosis Date Noted  . 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 Status post revision of total replacement of left knee 08/27/2016  . Septic arthritis (HCC) 05/28/2016  . Staph aureus infection 01/07/2016  . Infection of prosthetic left knee joint (HCC) 01/07/2016  . History of arthroplasty of left knee 01/07/2016  . Former smoker 01/07/2016  . Poorly controlled type 2 diabetes mellitus (HCC) 01/07/2016  . History of CVA (cerebrovascular accident) 01/07/2016  . Diabetic neuropathy (HCC) 01/07/2016  . Essential  hypertension 01/07/2016     Health Maintenance Due  Topic Date Due  . Hepatitis C Screening  09/27/1946  . FOOT EXAM  07/08/1955  . OPHTHALMOLOGY EXAM  07/08/1955  . TETANUS/TDAP  07/07/1964  . COLONOSCOPY  07/08/1995  . PNA vac Low Risk Adult (1 of 2 - PCV13) 07/07/2010     Review of Systems + back pain but does not subscribe to radiating down legs. Otherwise 12 point ros is negative. Physical Exam   BP 137/74   Pulse 69   Temp 98 F (36.7 C) (Oral)   Wt (!) 325 lb (147.4 kg)   SpO2 98%   BMI 45.33 kg/m  Physical Exam  Constitutional: He is oriented to person, place, and time. He appears well-developed and well-nourished. No distress.  HENT:  Mouth/Throat: Oropharynx is clear and moist. No oropharyngeal exudate.  Neurological: He is alert and oriented to person, place, and time.  Skin: Skin is warm and dry. No rash noted. No erythema. Incision is well healed Psychiatric: He has a normal mood and affect. His behavior is normal.  Ext: left knee no effusion, no warmth.   CBC Lab Results  Component Value Date   WBC 7.8 11/09/2016   RBC 4.01 (L) 11/09/2016   HGB 11.6 (L) 11/09/2016   HCT 36.4 (L) 11/09/2016   PLT 284 11/09/2016   MCV 90.8 11/09/2016   MCH 28.9 11/09/2016   MCHC 31.9 (L) 11/09/2016   RDW 15.1 (H) 11/09/2016   LYMPHSABS 2,028 11/09/2016   MONOABS 546 11/09/2016   EOSABS 156 11/09/2016    BMET Lab Results  Component Value Date   NA 141 11/09/2016   K 5.0 11/09/2016   CL 106 11/09/2016   CO2 24 11/09/2016   GLUCOSE 139 (H) 11/09/2016   BUN 42 (H) 11/09/2016   CREATININE 1.63 (H) 11/09/2016   CALCIUM 9.6 11/09/2016   GFRNONAA 37 (L) 09/21/2016   GFRAA 43 (L) 09/21/2016    Assessment and Plan  MSSA pji s/p abtx spacer and IV treatment and new implant = will check sed rate and crp. Can likely stop cephalexin if contimues to trend downward  ckd 3 = stable  Low back pain = will have the patient ask Dr Magnus Ivan who he would recommend and  decide if needs MRI. His back pain may also be the cause for elevated inflammatory markers. He was never ruled out for discitis at the time when patient had bacteremia, though he has had numerous courses of IV abtx that would treat lumbar discitis/osteo.

## 2017-02-09 LAB — C-REACTIVE PROTEIN: CRP: 5.4 mg/L (ref <8.0)

## 2017-02-09 LAB — SEDIMENTATION RATE: Sed Rate: 36 mm/hr — ABNORMAL HIGH (ref 0–20)

## 2017-02-15 ENCOUNTER — Ambulatory Visit (INDEPENDENT_AMBULATORY_CARE_PROVIDER_SITE_OTHER): Payer: Medicare Other

## 2017-02-15 ENCOUNTER — Ambulatory Visit (INDEPENDENT_AMBULATORY_CARE_PROVIDER_SITE_OTHER): Payer: Medicare Other | Admitting: Physician Assistant

## 2017-02-15 ENCOUNTER — Other Ambulatory Visit (INDEPENDENT_AMBULATORY_CARE_PROVIDER_SITE_OTHER): Payer: Self-pay

## 2017-02-15 DIAGNOSIS — Z96652 Presence of left artificial knee joint: Secondary | ICD-10-CM | POA: Diagnosis not present

## 2017-02-15 DIAGNOSIS — M545 Low back pain: Secondary | ICD-10-CM

## 2017-02-15 DIAGNOSIS — M5441 Lumbago with sciatica, right side: Principal | ICD-10-CM

## 2017-02-15 DIAGNOSIS — M5442 Lumbago with sciatica, left side: Principal | ICD-10-CM

## 2017-02-15 DIAGNOSIS — G8929 Other chronic pain: Secondary | ICD-10-CM

## 2017-02-15 NOTE — Progress Notes (Signed)
Office Visit Note   Patient: Marc Vasquez           Date of Birth: Aug 06, 1946           MRN: [id number] Visit Date: 02/15/2017              Requested by: Marc Ammons, MD [address] [place], [place] [address] PCP: Marc Ammons, MD   Assessment & Plan: Visit Diagnoses:  1. History of left knee replacement   2. Chronic bilateral low back pain, with sciatica presence unspecified     Plan: We will refer him back to Marc Vasquez for treatment of his low back pain. Did offer physical therapy defers. Would not place him on any steroids at this point time due to his septic knee and recent knee revision. He has been released from infectious disease at this point regards to the knee. He'll continue work on range of motion strengthening knee. We'll see him back in 6 months at that time we'll obtain AP and lateral views of his left knee. See him back on a sooner basis if he has any questions or concerns.  Follow-Up Instructions: Return in about 6 months (around 08/18/2017).   Orders:  Orders Placed This Encounter  Procedures  . XR Knee 1-2 Views Left  . XR Lumbar Spine 2-3 Views   No orders of the defined types were placed in this encounter.     Procedures: No procedures performed   Clinical Data: No additional findings.   Subjective: Postop left total knee revision Low back pain  HPI Marc Vasquez returns today 6 months status post left total knee revision. He reports that he's been released by Marc Vasquez from infectious disease in regards to his knee. Knee overall is doing well. He is complaining mostly of low back pain which she's had ongoing for years however it's became worse over the last week. No particular injury. As having no radicular symptoms down either leg. Pains mostly left lower lumbar region. Does not awaken him. He's had no bowel or bladder dysfunction. Saw Marc Vasquez back in 2012 CT of his back at that time showed mostly SI joint arthritis and some minimal disc  bulges and mild left sided facet degeneration at L2-3.  Review of Systems Denies fevers chills shortness of breath calf pain or radicular symptoms down either leg.  Objective: Vital Signs: There were no vitals taken for this visit.  Physical Exam  Constitutional: He is oriented to person, place, and time. He appears well-developed and well-nourished. No distress.  Pulmonary/Chest: Effort normal.  Neurological: He is alert and oriented to person, place, and time.  Psychiatric: He has a normal mood and affect. His behavior is normal.    Ortho Exam Left knee full extension flexion beyond 90. No instability valgus varus stressing. No abnormal warmth erythema or effusion. Surgical incision well-healed. Lower extremities 5 out of 5 strength throughout lower extremities against resistance. Negative straight leg raise bilaterally. Negative Patrick's test bilaterally. Nontender over the SI joints bilaterally tenderness over the left lower paraspinous region only. Specialty Comments:  No specialty comments available.  Imaging: Xr Knee 1-2 Views Left  Result Date: 02/15/2017 AP lateral views left knee: No acute fractures. Status post left total knee with revision components no hardware failure no evidence of loosening.  Xr Lumbar Spine 2-3 Views  Result Date: 02/15/2017 Lumbar spine AP lateral views: No acute fracture. No spondylolisthesis. Disc space overall well maintained. Loss of normal lordotic curvature.  Proximal lumbar spine with endplate spurring.    PMFS History: Patient Active Problem List   Diagnosis Date Noted  . 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 Status post revision of total replacement of left knee 08/27/2016  . Septic arthritis (HCC) 05/28/2016  . Staph aureus infection 01/07/2016  . Infection of prosthetic left knee joint (HCC) 01/07/2016  . History of arthroplasty of left knee 01/07/2016  . Former smoker 01/07/2016  . Poorly  controlled type 2 diabetes mellitus (HCC) 01/07/2016  . History of CVA (cerebrovascular accident) 01/07/2016  . Diabetic neuropathy (HCC) 01/07/2016  . Essential hypertension 01/07/2016   Past Medical History:  Diagnosis Date  . Chronic kidney disease    stage 3 kidney failure  . Depression   . 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 Former smoker quit on 12/15/15  . Generalized OA   . History of left knee replacement   . History of stroke   . Hypercholesteremia   . Infection of prosthetic left knee joint (HCC)   . Left hip pain   . Lumbago   . OSA (obstructive sleep apnea)   . Pneumonia   . Poorly controlled diabetes mellitus (HCC)   . Scarlet fever     Family History  Problem Relation Age of Onset  . Heart disease Unknown   . Diabetes Unknown     Past Surgical History:  Procedure Laterality Date  . EYE SURGERY     broken blood vessels, catarcts bil  . JOINT REPLACEMENT    . left foot surgery     stepped on thorn as child and got infected  . TEE WITHOUT CARDIOVERSION N/A 05/31/2016   Procedure: TRANSESOPHAGEAL ECHOCARDIOGRAM (TEE);  Surgeon: Marc BatheMark C Skains, MD;  [facility];  Service: Cardiovascular;  Laterality: N/A;  . TOE AMPUTATION     partial toe amputation  . TOTAL KNEE REVISION Left 05/28/2016   Procedure: REMOVAL OF ALL COMPONENTS OF LEFT TOTAL KNEE AND PLACEMENT OF ANTIBIOTIC SPACER;  Surgeon: Marc Hitchhristopher Y Blackman, MD;  Location: [facility] OR;  Service: Orthopedics;  Laterality: Left;  . TOTAL KNEE REVISION Left 08/27/2016   Procedure: LEFT TOTAL KNEE REVISION WITH REMOVAL OF ANTIBIOTIC SPACER;  Surgeon: Marc Hitchhristopher Y Blackman, MD;  Location: [facility] ORS;  Service: Orthopedics;  Laterality: Left;   Social History   Occupational History  . retired Visual merchandiserfarmer   . former veteran    Social History Main Topics  . Smoking status: Former Smoker    Packs/day: 1.00    Years: 15.00    Types: Cigarettes    Quit date:  12/15/2015  . Smokeless tobacco: Never Used  . Alcohol use 0.6 oz/week    1 Standard drinks or equivalent per week     Comment: rarely  . Drug use: No  . Sexual activity: Yes    Partners: Female     Comment: married , lives with his wife

## 2017-03-23 DIAGNOSIS — M545 Low back pain: Secondary | ICD-10-CM | POA: Diagnosis not present

## 2017-03-23 DIAGNOSIS — Z6841 Body Mass Index (BMI) 40.0 and over, adult: Secondary | ICD-10-CM | POA: Diagnosis not present

## 2017-03-27 DIAGNOSIS — M5126 Other intervertebral disc displacement, lumbar region: Secondary | ICD-10-CM | POA: Diagnosis not present

## 2017-03-27 DIAGNOSIS — M545 Low back pain: Secondary | ICD-10-CM | POA: Diagnosis not present

## 2017-04-14 DIAGNOSIS — M545 Low back pain: Secondary | ICD-10-CM | POA: Diagnosis not present

## 2017-04-26 DIAGNOSIS — M47816 Spondylosis without myelopathy or radiculopathy, lumbar region: Secondary | ICD-10-CM | POA: Diagnosis not present

## 2017-05-03 DIAGNOSIS — M47816 Spondylosis without myelopathy or radiculopathy, lumbar region: Secondary | ICD-10-CM | POA: Diagnosis not present

## 2017-05-24 DIAGNOSIS — M47816 Spondylosis without myelopathy or radiculopathy, lumbar region: Secondary | ICD-10-CM | POA: Diagnosis not present

## 2017-08-22 ENCOUNTER — Ambulatory Visit (INDEPENDENT_AMBULATORY_CARE_PROVIDER_SITE_OTHER): Payer: Medicare Other

## 2017-08-22 ENCOUNTER — Ambulatory Visit (INDEPENDENT_AMBULATORY_CARE_PROVIDER_SITE_OTHER): Payer: Medicare Other | Admitting: Physician Assistant

## 2017-08-22 ENCOUNTER — Encounter (INDEPENDENT_AMBULATORY_CARE_PROVIDER_SITE_OTHER): Payer: Self-pay | Admitting: Orthopaedic Surgery

## 2017-08-22 DIAGNOSIS — Z96652 Presence of left artificial knee joint: Secondary | ICD-10-CM | POA: Diagnosis not present

## 2017-08-22 NOTE — Progress Notes (Signed)
Office Visit Note   Patient: Marc Vasquez           Date of Birth: 11/12/1945           MRN: [id number] Visit Date: 08/22/2017              Requested by: Shelbie AmmonsHaque, Imran P, MD [address] [place], [place] [id number] PCP: Shelbie AmmonsHaque, Imran P, MD   Assessment & Plan: Visit Diagnoses:  1. History of left knee replacement     Plan: He will continue to work on his range of motion and strengthening.  He follows up with us if he has any questions concerns any increased pain signs of infection.  Questions were encouraged and answered at length today.  Follow-Up Instructions: Return if symptoms worsen or fail to improve.   Orders:  Orders Placed This Encounter  Procedures  . XR Knee 1-2 Views Left   No orders of the defined types were placed in this encounter.     Procedures: No procedures performed   Clinical Data: No additional findings.   Subjective: Chief Complaint  Patient presents with  . Left Knee - Follow-up    HPI Marc Vasquez returns today 1 year status post left total knee revision with removal of antibiotic spacer.  States overall her knee is doing well.  He has had some swelling in the knee ever since surgery.  Still making slow progress with the range of motion and strength of the knee.  Has no complaints in regards to his knee.  He does use a walker due to chronic low back pain.  Review of Systems Denies fevers chills, shortness breath, chest pain.  Positive for low back pain.  Objective: Vital Signs: There were no vitals taken for this visit.  Physical Exam  Constitutional: He is oriented to person, place, and time. He appears well-developed and well-nourished. No distress.  Neurological: He is alert and oriented to person, place, and time.  Skin: He is not diaphoretic.  Psychiatric: He has a normal mood and affect.    Ortho Exam Left knee full extension flexion to approximately 110 degrees.  No instability valgus varus stressing.  Anterior drawer is  negative.  Surgical incisions well-healed no signs of infection.  Calf soft nontender.  Specialty Comments:  No specialty comments available.  Imaging: Xr Knee 1-2 Views Left  Result Date: 08/22/2017 AP lateral views left knee: Status post left total knee revision stems.  Components are well seated.  No acute fractures.  No bony abnormalities or bony lesions.    PMFS History: Patient Active Problem List   Diagnosis Date Noted  . 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 Status post revision of total replacement of left knee 08/27/2016  . Septic arthritis (HCC) 05/28/2016  . Staph aureus infection 01/07/2016  . Infection of prosthetic left knee joint (HCC) 01/07/2016  . History of arthroplasty of left knee 01/07/2016  . Former smoker 01/07/2016  . Poorly controlled type 2 diabetes mellitus (HCC) 01/07/2016  . History of CVA (cerebrovascular accident) 01/07/2016  . Diabetic neuropathy (HCC) 01/07/2016  . Essential hypertension 01/07/2016   Past Medical History:  Diagnosis Date  . Chronic kidney disease    stage 3 kidney failure  . Depression   . 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 Former smoker quit on 12/15/15  . Generalized OA   . History of left knee replacement   .  History of stroke   . Hypercholesteremia   . Infection of prosthetic left knee joint (HCC)   . Left hip pain   . Lumbago   . OSA (obstructive sleep apnea)   . Pneumonia   . Poorly controlled diabetes mellitus (HCC)   . Scarlet fever     Family History  Problem Relation Age of Onset  . Heart disease Unknown   . Diabetes Unknown     Past Surgical History:  Procedure Laterality Date  . EYE SURGERY     broken blood vessels, catarcts bil  . JOINT REPLACEMENT    . left foot surgery     stepped on thorn as child and got infected  . LEFT TOTAL KNEE REVISION WITH REMOVAL OF ANTIBIOTIC SPACER Left 08/27/2016    Performed by Kathryne HitchBlackman, Christopher Y[facility]  . REMOVAL OF ALL COMPONENTS OF LEFT TOTAL KNEE AND PLACEMENT OF ANTIBIOTIC SPACER Left 05/28/2016   Performed [facility] Kathryne HitchBlackman, Christopher Y[facility]  . TOE AMPUTATION     partial toe amputation  . TRANSESOPHAGEAL ECHOCARDIOGRAM (TEE) N/A 05/31/2016   Performed by Jake BatheSkains, Mark C, MD [facility]   Social History   Occupational History  . Occupation: retired Visual merchandiserfarmer  . Occupation: former veteran  Tobacco Use  . Smoking status: Former Smoker    Packs/day: 1.00    Years: 15.00    Pack years: 15.00    Types: Cigarettes    Last attempt to quit: 12/15/2015    Years since quitting: 1.6  . Smokeless tobacco: Never Used  Substance and Sexual Activity  . Alcohol use: Yes    Alcohol/week: 0.6 oz    Types: 1 Standard drinks or equivalent per week    Comment: rarely  . Drug use: No  . Sexual activity: Yes    Partners: Female    Comment: married , lives with his wife

## 2018-06-28 DIAGNOSIS — H35373 Puckering of macula, bilateral: Secondary | ICD-10-CM | POA: Diagnosis not present

## 2018-06-28 DIAGNOSIS — E113553 Type 2 diabetes mellitus with stable proliferative diabetic retinopathy, bilateral: Secondary | ICD-10-CM | POA: Diagnosis not present

## 2018-06-28 IMAGING — CR DG KNEE 1-2V PORT*L*
2 series · 2 of 2 positions shown · non-contrast
Comparison: None.

CLINICAL DATA: Removal of knee joint prosthetic components.

EXAM:
PORTABLE LEFT KNEE - 1-2 VIEW

[ap]
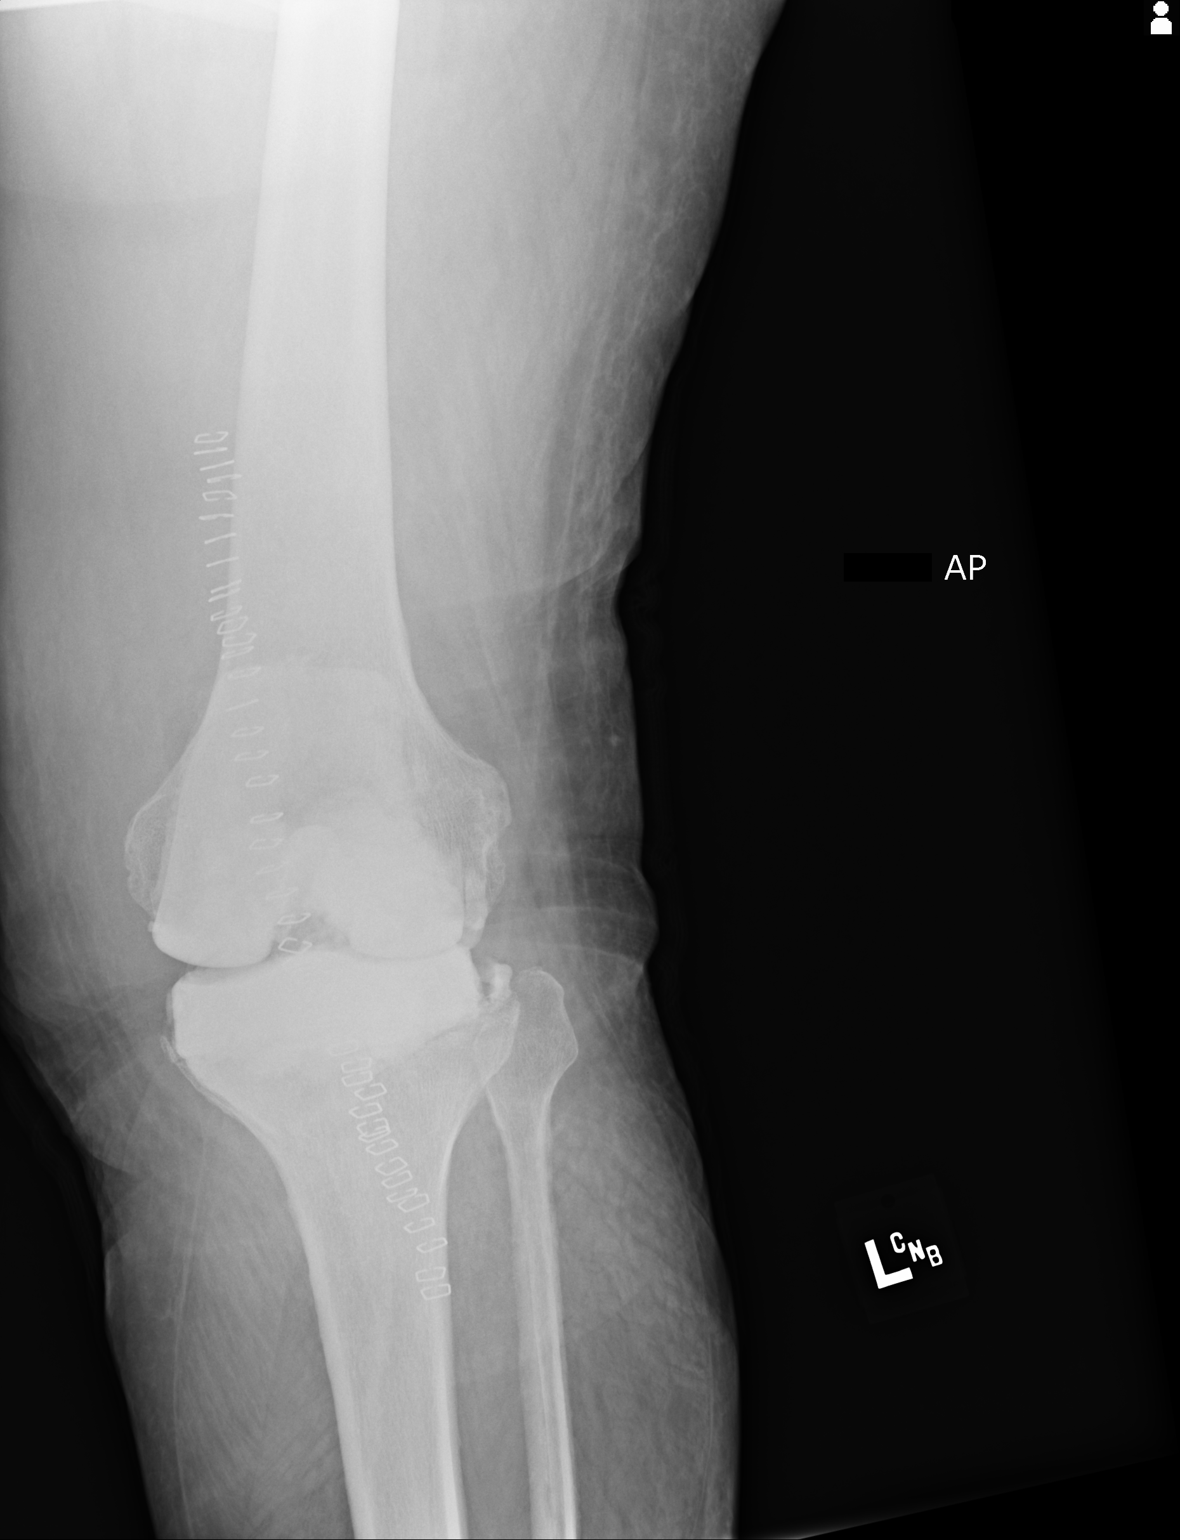

[lat]
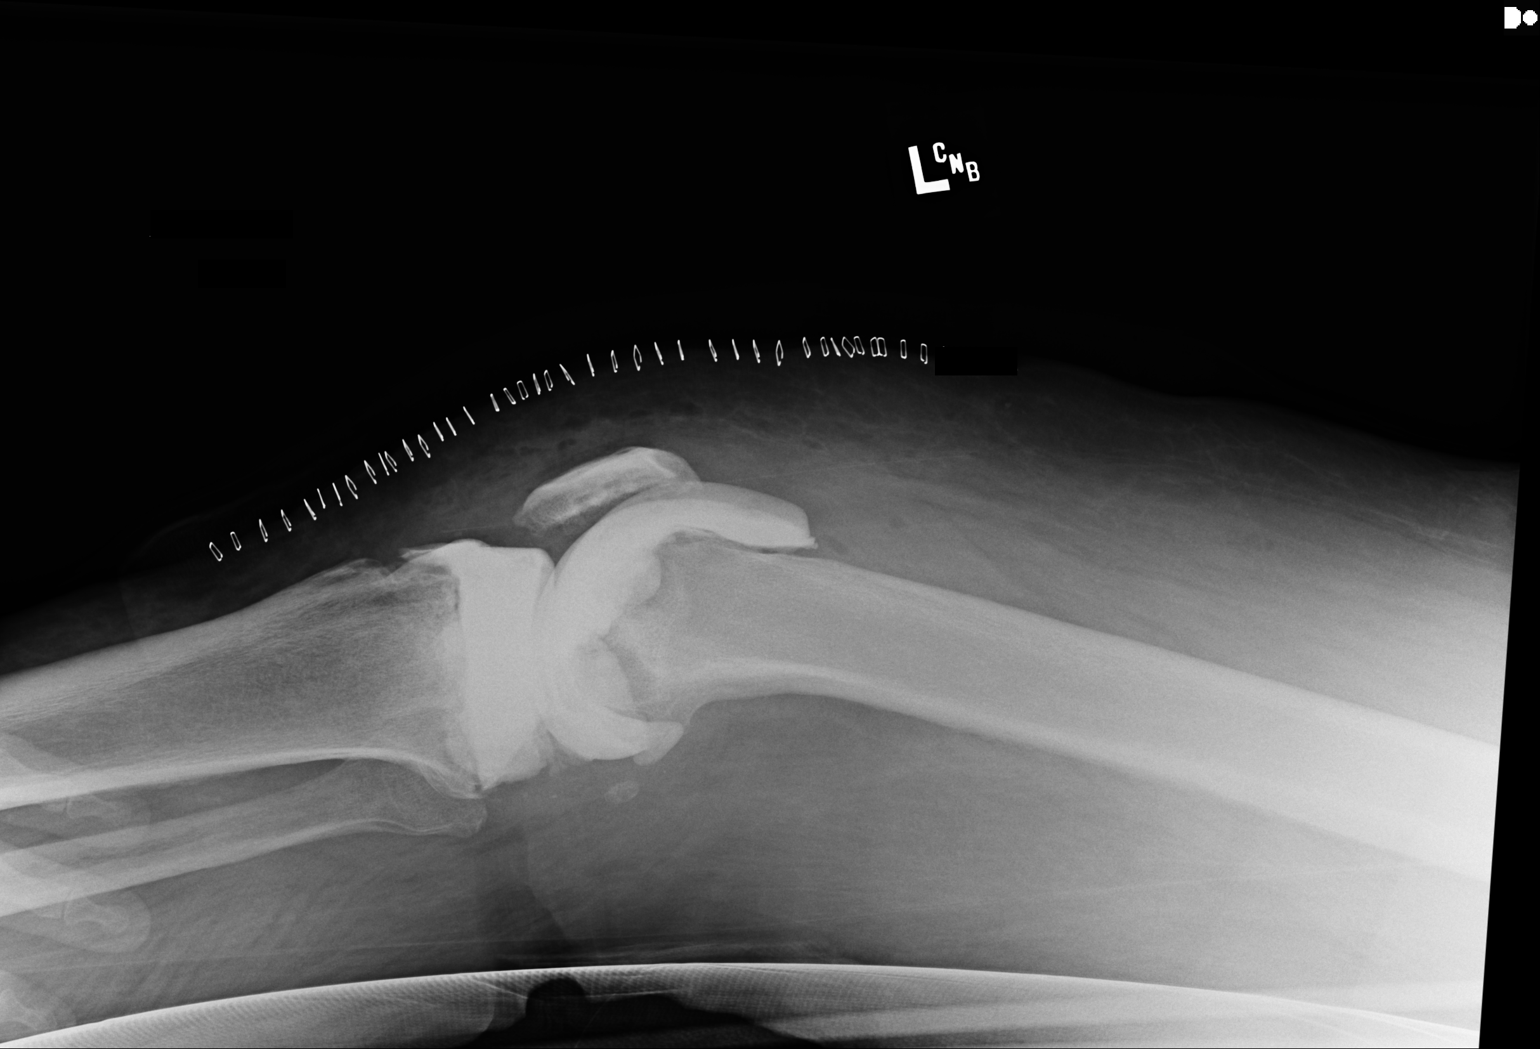

[2 of 2 positions shown; findings below may reference images not displayed]

FINDINGS: Postoperative images demonstrate removal of the metal prosthetic
components. Radio-opaque antibiotic impregnated spacers have been
placed over the distal femur and proximal tibia, well positioned.
IMPRESSION: Status post removal of infected knee prosthesis components with
placement of antibiotic impregnated spacers.

## 2018-07-01 DIAGNOSIS — Z23 Encounter for immunization: Secondary | ICD-10-CM | POA: Diagnosis not present

## 2018-07-24 DIAGNOSIS — L089 Local infection of the skin and subcutaneous tissue, unspecified: Secondary | ICD-10-CM | POA: Diagnosis not present

## 2018-07-24 DIAGNOSIS — M86171 Other acute osteomyelitis, right ankle and foot: Secondary | ICD-10-CM | POA: Diagnosis not present

## 2018-07-24 DIAGNOSIS — L03031 Cellulitis of right toe: Secondary | ICD-10-CM | POA: Diagnosis not present

## 2018-07-27 DIAGNOSIS — S91101A Unspecified open wound of right great toe without damage to nail, initial encounter: Secondary | ICD-10-CM | POA: Diagnosis not present

## 2018-07-27 DIAGNOSIS — L089 Local infection of the skin and subcutaneous tissue, unspecified: Secondary | ICD-10-CM | POA: Diagnosis not present

## 2018-09-12 DIAGNOSIS — H35373 Puckering of macula, bilateral: Secondary | ICD-10-CM | POA: Diagnosis not present

## 2018-09-12 DIAGNOSIS — E113553 Type 2 diabetes mellitus with stable proliferative diabetic retinopathy, bilateral: Secondary | ICD-10-CM | POA: Diagnosis not present

## 2018-09-27 IMAGING — DX DG KNEE 1-2V PORT*L*
4 series · 4 of 4 positions shown · non-contrast
Comparison: 05/28/2016

CLINICAL DATA: Postoperative left total knee replacement.

EXAM:
PORTABLE LEFT KNEE - 1-2 VIEW

[knee ap (1 of 2)]
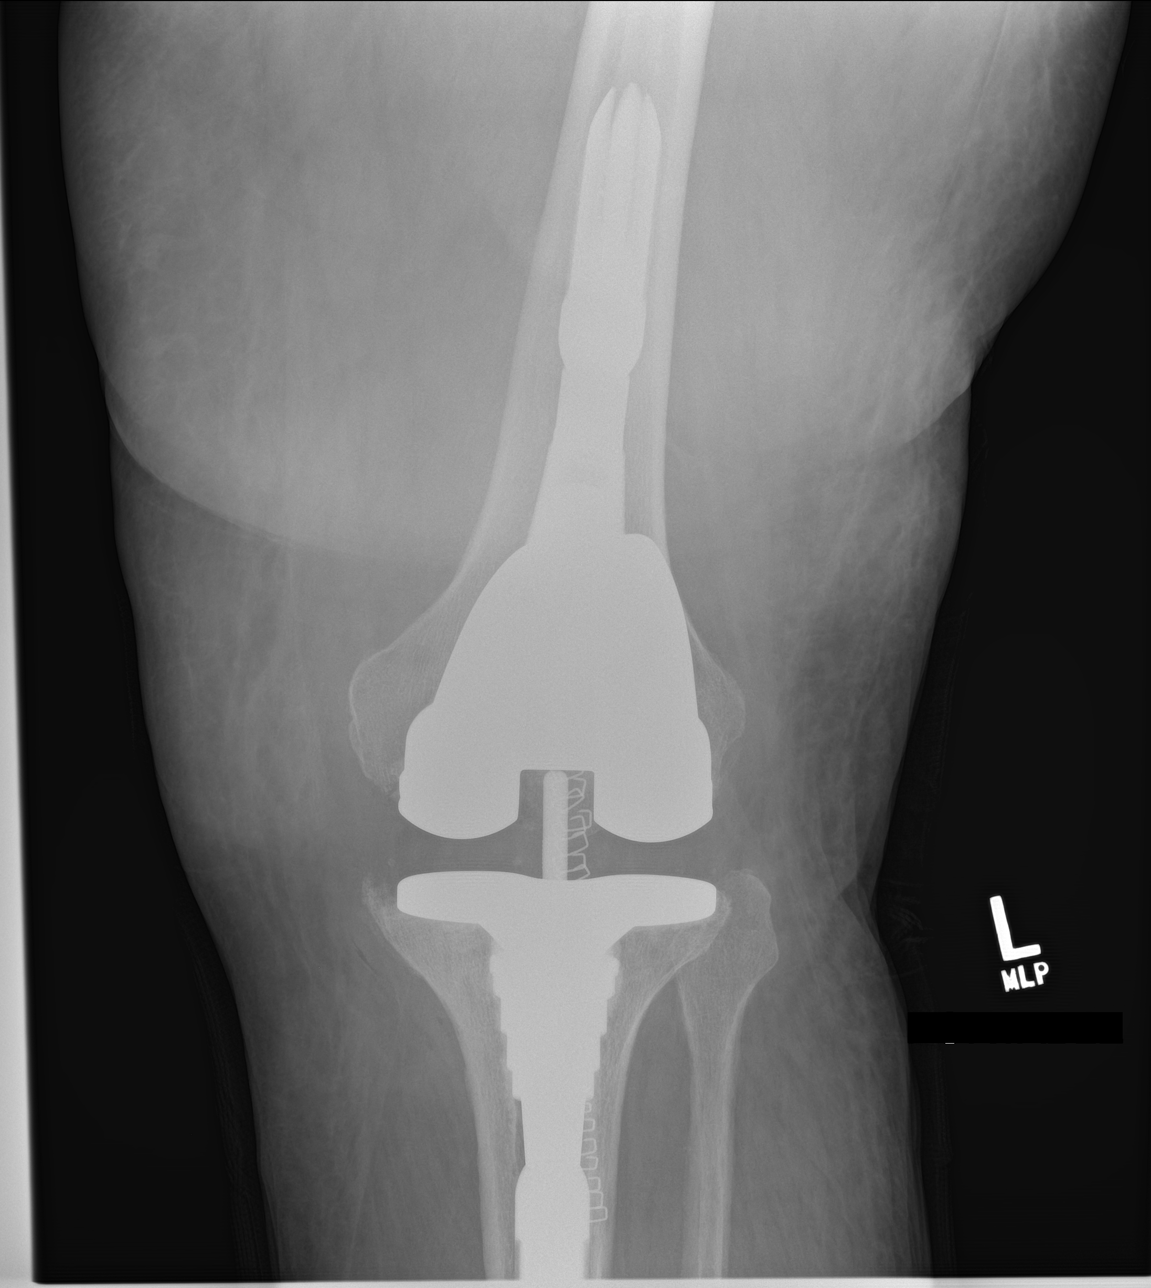

[knee lat (1 of 2)]
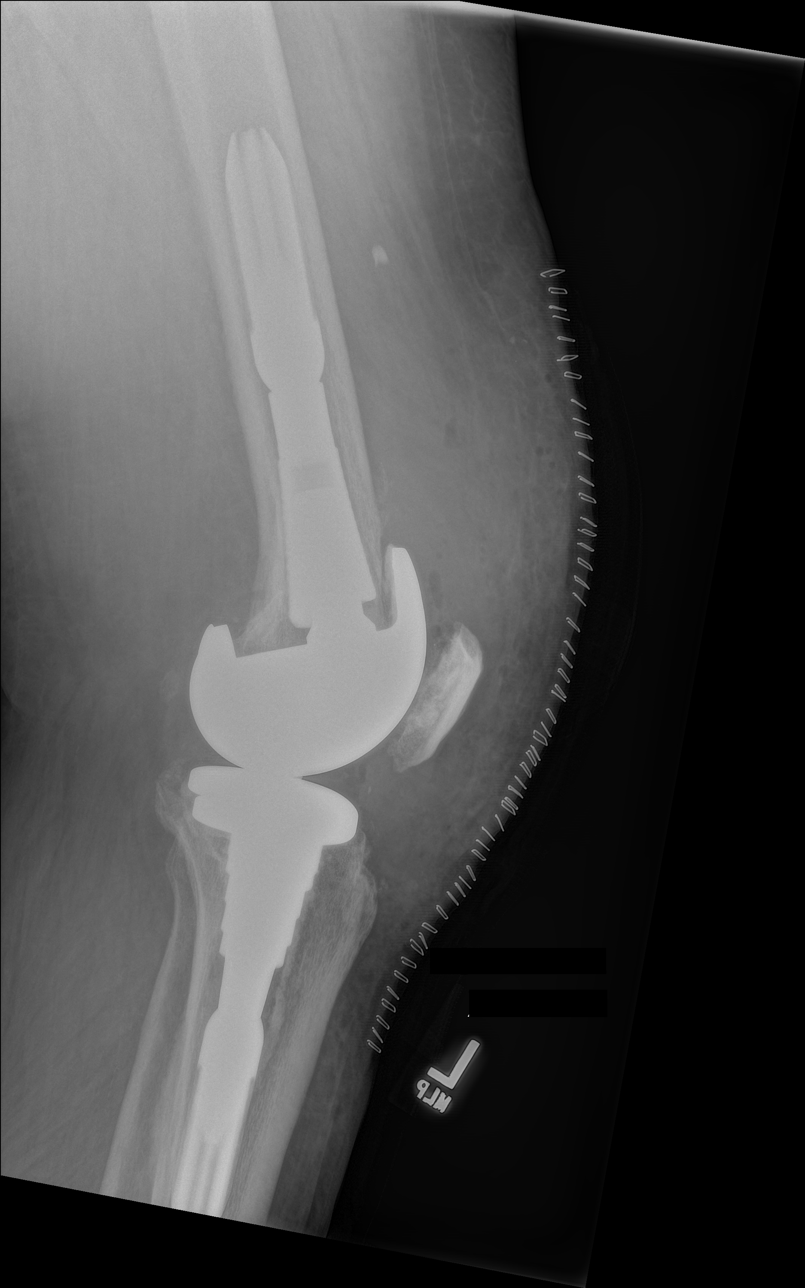

[knee ap (2 of 2)]
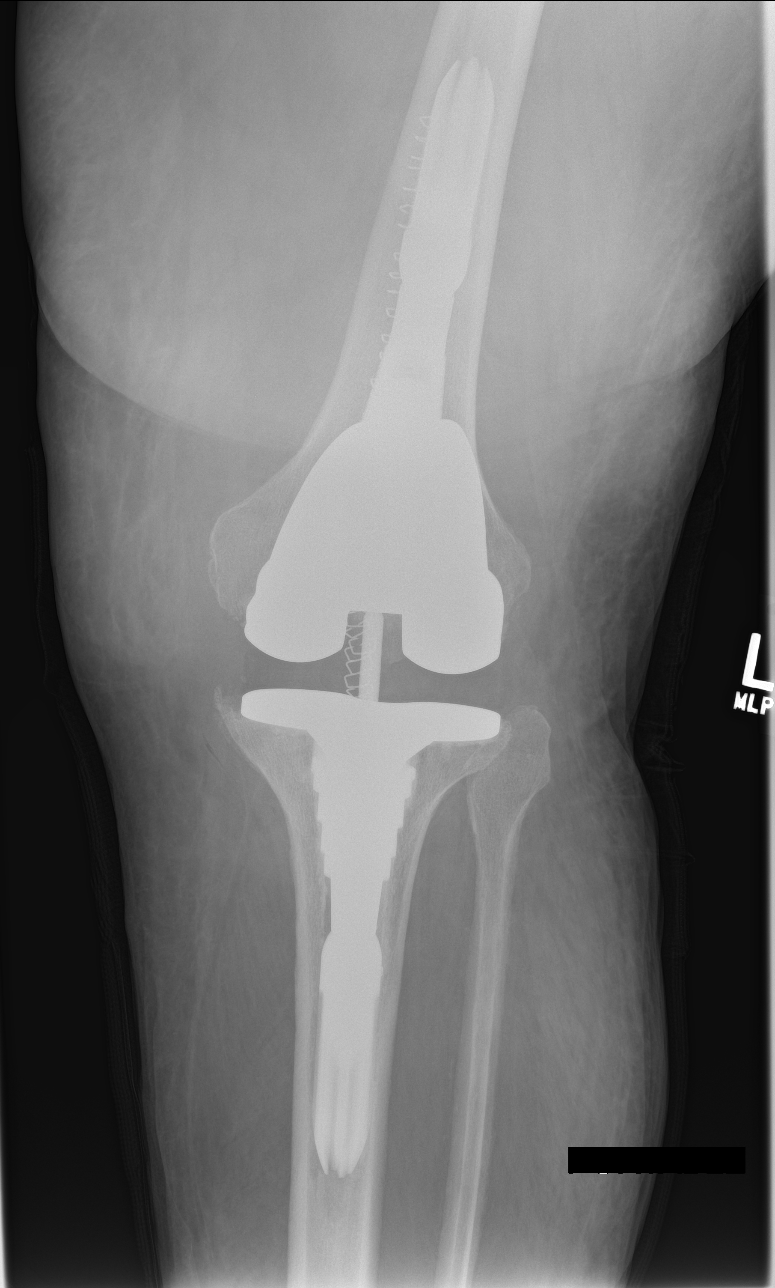

[knee lat (2 of 2)]
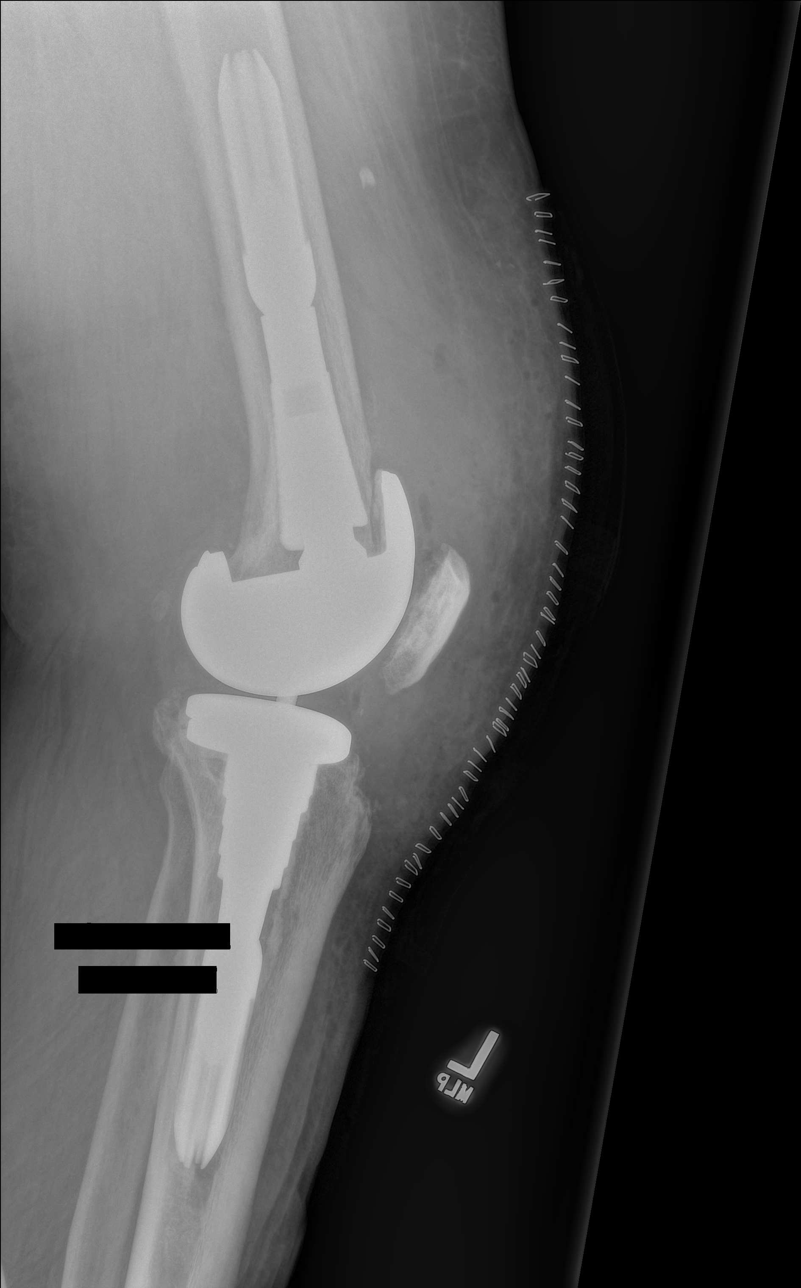

[4 of 4 positions shown; findings below may reference images not displayed]

FINDINGS: Left total knee replacement identified. There is no evidence of
subluxation or dislocation. No definite complicating features are
identified.

Postoperative changes are present.
IMPRESSION: Left total knee replacement without complicating features
identified.

## 2018-10-18 DIAGNOSIS — S336XXA Sprain of sacroiliac joint, initial encounter: Secondary | ICD-10-CM | POA: Diagnosis not present

## 2018-10-18 DIAGNOSIS — M5387 Other specified dorsopathies, lumbosacral region: Secondary | ICD-10-CM | POA: Diagnosis not present

## 2018-10-18 DIAGNOSIS — M9902 Segmental and somatic dysfunction of thoracic region: Secondary | ICD-10-CM | POA: Diagnosis not present

## 2018-10-18 DIAGNOSIS — M9903 Segmental and somatic dysfunction of lumbar region: Secondary | ICD-10-CM | POA: Diagnosis not present

## 2018-10-18 DIAGNOSIS — M9905 Segmental and somatic dysfunction of pelvic region: Secondary | ICD-10-CM | POA: Diagnosis not present

## 2018-10-18 DIAGNOSIS — M5136 Other intervertebral disc degeneration, lumbar region: Secondary | ICD-10-CM | POA: Diagnosis not present

## 2018-10-19 DIAGNOSIS — M9903 Segmental and somatic dysfunction of lumbar region: Secondary | ICD-10-CM | POA: Diagnosis not present

## 2018-10-19 DIAGNOSIS — M9902 Segmental and somatic dysfunction of thoracic region: Secondary | ICD-10-CM | POA: Diagnosis not present

## 2018-10-19 DIAGNOSIS — M5136 Other intervertebral disc degeneration, lumbar region: Secondary | ICD-10-CM | POA: Diagnosis not present

## 2018-10-19 DIAGNOSIS — M9905 Segmental and somatic dysfunction of pelvic region: Secondary | ICD-10-CM | POA: Diagnosis not present

## 2018-10-19 DIAGNOSIS — M5387 Other specified dorsopathies, lumbosacral region: Secondary | ICD-10-CM | POA: Diagnosis not present

## 2018-10-19 DIAGNOSIS — S336XXA Sprain of sacroiliac joint, initial encounter: Secondary | ICD-10-CM | POA: Diagnosis not present

## 2018-10-23 DIAGNOSIS — M5387 Other specified dorsopathies, lumbosacral region: Secondary | ICD-10-CM | POA: Diagnosis not present

## 2018-10-23 DIAGNOSIS — M9905 Segmental and somatic dysfunction of pelvic region: Secondary | ICD-10-CM | POA: Diagnosis not present

## 2018-10-23 DIAGNOSIS — M5136 Other intervertebral disc degeneration, lumbar region: Secondary | ICD-10-CM | POA: Diagnosis not present

## 2018-10-23 DIAGNOSIS — M9903 Segmental and somatic dysfunction of lumbar region: Secondary | ICD-10-CM | POA: Diagnosis not present

## 2018-10-23 DIAGNOSIS — S336XXA Sprain of sacroiliac joint, initial encounter: Secondary | ICD-10-CM | POA: Diagnosis not present

## 2018-10-23 DIAGNOSIS — M9902 Segmental and somatic dysfunction of thoracic region: Secondary | ICD-10-CM | POA: Diagnosis not present

## 2018-10-24 DIAGNOSIS — S336XXA Sprain of sacroiliac joint, initial encounter: Secondary | ICD-10-CM | POA: Diagnosis not present

## 2018-10-24 DIAGNOSIS — M9903 Segmental and somatic dysfunction of lumbar region: Secondary | ICD-10-CM | POA: Diagnosis not present

## 2018-10-24 DIAGNOSIS — M9902 Segmental and somatic dysfunction of thoracic region: Secondary | ICD-10-CM | POA: Diagnosis not present

## 2018-10-24 DIAGNOSIS — M5387 Other specified dorsopathies, lumbosacral region: Secondary | ICD-10-CM | POA: Diagnosis not present

## 2018-10-24 DIAGNOSIS — M5136 Other intervertebral disc degeneration, lumbar region: Secondary | ICD-10-CM | POA: Diagnosis not present

## 2018-10-24 DIAGNOSIS — M9905 Segmental and somatic dysfunction of pelvic region: Secondary | ICD-10-CM | POA: Diagnosis not present

## 2018-10-26 DIAGNOSIS — M5387 Other specified dorsopathies, lumbosacral region: Secondary | ICD-10-CM | POA: Diagnosis not present

## 2018-10-26 DIAGNOSIS — M9905 Segmental and somatic dysfunction of pelvic region: Secondary | ICD-10-CM | POA: Diagnosis not present

## 2018-10-26 DIAGNOSIS — M9902 Segmental and somatic dysfunction of thoracic region: Secondary | ICD-10-CM | POA: Diagnosis not present

## 2018-10-26 DIAGNOSIS — M9903 Segmental and somatic dysfunction of lumbar region: Secondary | ICD-10-CM | POA: Diagnosis not present

## 2018-10-26 DIAGNOSIS — S336XXA Sprain of sacroiliac joint, initial encounter: Secondary | ICD-10-CM | POA: Diagnosis not present

## 2018-10-26 DIAGNOSIS — M5136 Other intervertebral disc degeneration, lumbar region: Secondary | ICD-10-CM | POA: Diagnosis not present

## 2018-10-30 DIAGNOSIS — M5136 Other intervertebral disc degeneration, lumbar region: Secondary | ICD-10-CM | POA: Diagnosis not present

## 2018-10-30 DIAGNOSIS — S336XXA Sprain of sacroiliac joint, initial encounter: Secondary | ICD-10-CM | POA: Diagnosis not present

## 2018-10-30 DIAGNOSIS — M9902 Segmental and somatic dysfunction of thoracic region: Secondary | ICD-10-CM | POA: Diagnosis not present

## 2018-10-30 DIAGNOSIS — M9903 Segmental and somatic dysfunction of lumbar region: Secondary | ICD-10-CM | POA: Diagnosis not present

## 2018-10-30 DIAGNOSIS — M9905 Segmental and somatic dysfunction of pelvic region: Secondary | ICD-10-CM | POA: Diagnosis not present

## 2018-10-30 DIAGNOSIS — M5387 Other specified dorsopathies, lumbosacral region: Secondary | ICD-10-CM | POA: Diagnosis not present

## 2018-10-31 DIAGNOSIS — S336XXA Sprain of sacroiliac joint, initial encounter: Secondary | ICD-10-CM | POA: Diagnosis not present

## 2018-10-31 DIAGNOSIS — M9905 Segmental and somatic dysfunction of pelvic region: Secondary | ICD-10-CM | POA: Diagnosis not present

## 2018-10-31 DIAGNOSIS — M9902 Segmental and somatic dysfunction of thoracic region: Secondary | ICD-10-CM | POA: Diagnosis not present

## 2018-10-31 DIAGNOSIS — M5136 Other intervertebral disc degeneration, lumbar region: Secondary | ICD-10-CM | POA: Diagnosis not present

## 2018-10-31 DIAGNOSIS — M5387 Other specified dorsopathies, lumbosacral region: Secondary | ICD-10-CM | POA: Diagnosis not present

## 2018-10-31 DIAGNOSIS — M9903 Segmental and somatic dysfunction of lumbar region: Secondary | ICD-10-CM | POA: Diagnosis not present

## 2018-11-02 DIAGNOSIS — M9903 Segmental and somatic dysfunction of lumbar region: Secondary | ICD-10-CM | POA: Diagnosis not present

## 2018-11-02 DIAGNOSIS — S336XXA Sprain of sacroiliac joint, initial encounter: Secondary | ICD-10-CM | POA: Diagnosis not present

## 2018-11-02 DIAGNOSIS — M9902 Segmental and somatic dysfunction of thoracic region: Secondary | ICD-10-CM | POA: Diagnosis not present

## 2018-11-02 DIAGNOSIS — M5136 Other intervertebral disc degeneration, lumbar region: Secondary | ICD-10-CM | POA: Diagnosis not present

## 2018-11-02 DIAGNOSIS — M5387 Other specified dorsopathies, lumbosacral region: Secondary | ICD-10-CM | POA: Diagnosis not present

## 2018-11-02 DIAGNOSIS — M9905 Segmental and somatic dysfunction of pelvic region: Secondary | ICD-10-CM | POA: Diagnosis not present

## 2018-11-06 DIAGNOSIS — M5136 Other intervertebral disc degeneration, lumbar region: Secondary | ICD-10-CM | POA: Diagnosis not present

## 2018-11-06 DIAGNOSIS — M9905 Segmental and somatic dysfunction of pelvic region: Secondary | ICD-10-CM | POA: Diagnosis not present

## 2018-11-06 DIAGNOSIS — S336XXA Sprain of sacroiliac joint, initial encounter: Secondary | ICD-10-CM | POA: Diagnosis not present

## 2018-11-06 DIAGNOSIS — M9903 Segmental and somatic dysfunction of lumbar region: Secondary | ICD-10-CM | POA: Diagnosis not present

## 2018-11-06 DIAGNOSIS — M9902 Segmental and somatic dysfunction of thoracic region: Secondary | ICD-10-CM | POA: Diagnosis not present

## 2018-11-06 DIAGNOSIS — M5387 Other specified dorsopathies, lumbosacral region: Secondary | ICD-10-CM | POA: Diagnosis not present

## 2018-11-08 ENCOUNTER — Encounter (INDEPENDENT_AMBULATORY_CARE_PROVIDER_SITE_OTHER): Payer: Self-pay | Admitting: Orthopaedic Surgery

## 2018-11-08 ENCOUNTER — Ambulatory Visit (INDEPENDENT_AMBULATORY_CARE_PROVIDER_SITE_OTHER): Payer: Medicare Other | Admitting: Orthopaedic Surgery

## 2018-11-08 ENCOUNTER — Ambulatory Visit (INDEPENDENT_AMBULATORY_CARE_PROVIDER_SITE_OTHER): Payer: Medicare Other

## 2018-11-08 ENCOUNTER — Telehealth (INDEPENDENT_AMBULATORY_CARE_PROVIDER_SITE_OTHER): Payer: Self-pay

## 2018-11-08 ENCOUNTER — Ambulatory Visit (INDEPENDENT_AMBULATORY_CARE_PROVIDER_SITE_OTHER): Payer: Self-pay

## 2018-11-08 DIAGNOSIS — M1711 Unilateral primary osteoarthritis, right knee: Secondary | ICD-10-CM

## 2018-11-08 DIAGNOSIS — M25561 Pain in right knee: Secondary | ICD-10-CM

## 2018-11-08 DIAGNOSIS — Z96652 Presence of left artificial knee joint: Secondary | ICD-10-CM

## 2018-11-08 DIAGNOSIS — M25562 Pain in left knee: Secondary | ICD-10-CM

## 2018-11-08 NOTE — Progress Notes (Signed)
Office Visit Note   Patient: Marc SprinklesMichael R Josey           Date of Birth: 08/04/1946           MRN: [id number] Visit Date: 11/08/2018              Requested by: Shelbie AmmonsHaque, Imran P, MD [address] [place], [place] [id number] PCP: Shelbie AmmonsHaque, Imran P, MD   Assessment & Plan: Visit Diagnoses:  1. Left knee pain, unspecified chronicity   2. Right knee pain, unspecified chronicity   3. Status post revision of total replacement of left knee   4. Unilateral primary osteoarthritis, right knee     Plan: Currently given his obesity and his poorly controlled diabetes he is not a candidate for any surgery nor is he a candidate for steroid injections.  I do feel that he would benefit from a hyaluronic acid injection in the right knee.  I counseled him about activity modification and weight loss and blood glucose control.  We will see him back in about 3 weeks to at least place a hyaluronic acid injection in the right moderate arthritic knee.  As far as his left knee goes, if he is able to get his health under control we could consider a repeat surgery such as upsizing his polyliner to give more stability but I would not even consider that until his health was in a better position.  Follow-Up Instructions: Return in about 3 weeks (around 11/29/2018).   Orders:  Orders Placed This Encounter  Procedures  . XR Knee 1-2 Views Left  . XR Knee 1-2 Views Right   No orders of the defined types were placed in this encounter.     Procedures: No procedures performed   Clinical Data: No additional findings.   Subjective: Chief Complaint  Patient presents with  . Left Knee - Pain  . Right Knee - Pain  The patient is a morbidly obese diabetic who has a history of a left total [place].  [facility] when I was on call with infected total joint.  He is a poorly controlled diabetic as well.  We had to remove that  total joint and place a temporary antibiotic spacer.  Once he cleared the infection we had to put a revision knee arthroplasty on the left side with long stems.  He has daily knee pain on his left and right knees.  He also reports his blood glucose runs significantly high.  His last hemoglobin A1c was well over 8 as well.  He does use a walker to get around with.  He is hoping to have some type of intervention for least his right knee.  He weighs 325 pounds.  HPI  Review of Systems He currently denies any headache, chest pain, shortness of breath, fever, chills, nausea, vomiting  Objective: Vital Signs: There were no vitals taken for this visit.  Physical Exam He is alert and oriented x3 and in no acute distress.  He is morbidly obese.  He has difficulty getting out of a chair and getting up on the exam table due to his obesity and deconditioning Ortho Exam Examination of both knees shows no effusion.  His left operative knee has no redness.  There is slight play on the exam of the knee from a ligamentous standpoint.  His right knee has global tenderness and slight patellofemoral crepitation  and slight varus malalignment. Specialty Comments:  No specialty comments available.  Imaging: Xr Knee 1-2 Views Left  Result Date: 11/08/2018 2 views of the left knee show a revision knee arthroplasty.  There is some settling of the tibial component.  There is potential signs of loosening around the femoral part and the tibial part but this is minimal.  Xr Knee 1-2 Views Right  Result Date: 11/08/2018 2 views of the right knee show no acute findings with only moderate arthritic changes.  There is no evidence of bone-on-bone wear.    PMFS History: Patient Active Problem List   Diagnosis Date Noted  . 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 Status post revision of total replacement of left knee 08/27/2016  . Septic arthritis (HCC) 05/28/2016  . Staph aureus  infection 01/07/2016  . Infection of prosthetic left knee joint (HCC) 01/07/2016  . History of arthroplasty of left knee 01/07/2016  . Former smoker 01/07/2016  . Poorly controlled type 2 diabetes mellitus (HCC) 01/07/2016  . History of CVA (cerebrovascular accident) 01/07/2016  . Diabetic neuropathy (HCC) 01/07/2016  . Essential hypertension 01/07/2016   Past Medical History:  Diagnosis Date  . Chronic kidney disease    stage 3 kidney failure  . Depression   . 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 Former smoker quit on 12/15/15  . Generalized OA   . History of left knee replacement   . History of stroke   . Hypercholesteremia   . Infection of prosthetic left knee joint (HCC)   . Left hip pain   . Lumbago   . OSA (obstructive sleep apnea)   . Pneumonia   . Poorly controlled diabetes mellitus (HCC)   . Scarlet fever     Family History  Problem Relation Age of Onset  . Heart disease Unknown   . Diabetes Unknown     Past Surgical History:  Procedure Laterality Date  . EYE SURGERY     broken blood vessels, catarcts bil  . JOINT REPLACEMENT    . left foot surgery     stepped on thorn as child and got infected  . TEE WITHOUT CARDIOVERSION N/A 05/31/2016   Procedure: TRANSESOPHAGEAL ECHOCARDIOGRAM (TEE);  Surgeon: Jake Bathe, MD;  Location: [facility] ENDOSCOPY;  Service: Cardiovascular;  Laterality: N/A;  . TOE AMPUTATION     partial toe amputation  . TOTAL KNEE REVISION Left 05/28/2016   Procedure: REMOVAL OF ALL COMPONENTS OF LEFT TOTAL KNEE AND PLACEMENT OF ANTIBIOTIC SPACER;  Surgeon: Kathryne Hitch, MD;  Location: [facility] OR;  Service: Orthopedics;  Laterality: Left;  . TOTAL KNEE REVISION Left 08/27/2016   Procedure: LEFT TOTAL KNEE REVISION WITH REMOVAL OF ANTIBIOTIC SPACER;  Surgeon: Kathryne Hitch, MD;  Location: [facility] ORS;  Service: Orthopedics;  Laterality: Left;   Social History   Occupational History  .  Occupation: retired Visual merchandiser  . Occupation: former veteran  Tobacco Use  . Smoking status: Former Smoker    Packs/day: 1.00    Years: 15.00    Pack years: 15.00    Types: Cigarettes    Last attempt to quit: 12/15/2015    Years since quitting: 2.9  . Smokeless tobacco: Never Used  Substance and Sexual Activity  . Alcohol use: Yes    Alcohol/week: 1.0 standard drinks    Types: 1 Standard drinks or equivalent per week    Comment: rarely  . Drug use:  No  . Sexual activity: Yes    Partners: Female    Comment: married , lives with his wife

## 2018-11-08 NOTE — Telephone Encounter (Signed)
Right knee gel injection  

## 2018-11-09 DIAGNOSIS — M9905 Segmental and somatic dysfunction of pelvic region: Secondary | ICD-10-CM | POA: Diagnosis not present

## 2018-11-09 DIAGNOSIS — M9902 Segmental and somatic dysfunction of thoracic region: Secondary | ICD-10-CM | POA: Diagnosis not present

## 2018-11-09 DIAGNOSIS — M5136 Other intervertebral disc degeneration, lumbar region: Secondary | ICD-10-CM | POA: Diagnosis not present

## 2018-11-09 DIAGNOSIS — S336XXA Sprain of sacroiliac joint, initial encounter: Secondary | ICD-10-CM | POA: Diagnosis not present

## 2018-11-09 DIAGNOSIS — M5387 Other specified dorsopathies, lumbosacral region: Secondary | ICD-10-CM | POA: Diagnosis not present

## 2018-11-09 DIAGNOSIS — M9903 Segmental and somatic dysfunction of lumbar region: Secondary | ICD-10-CM | POA: Diagnosis not present

## 2018-11-09 NOTE — Telephone Encounter (Signed)
Noted  

## 2018-11-13 ENCOUNTER — Telehealth (INDEPENDENT_AMBULATORY_CARE_PROVIDER_SITE_OTHER): Payer: Self-pay

## 2018-11-13 NOTE — Telephone Encounter (Signed)
Submitted VOB for SynviscOne, right knee. 

## 2018-11-15 ENCOUNTER — Telehealth (INDEPENDENT_AMBULATORY_CARE_PROVIDER_SITE_OTHER): Payer: Self-pay

## 2018-11-15 NOTE — Telephone Encounter (Signed)
Patient is approved for SynviscOne, right knee. Buy & Bill Covered at 100% through [facility] after Medicare pays. No Co-pay No PA required  Appt.11/29/2018 with Dr. Magnus Ivan

## 2018-11-23 DIAGNOSIS — M5387 Other specified dorsopathies, lumbosacral region: Secondary | ICD-10-CM | POA: Diagnosis not present

## 2018-11-23 DIAGNOSIS — M5136 Other intervertebral disc degeneration, lumbar region: Secondary | ICD-10-CM | POA: Diagnosis not present

## 2018-11-23 DIAGNOSIS — M9905 Segmental and somatic dysfunction of pelvic region: Secondary | ICD-10-CM | POA: Diagnosis not present

## 2018-11-23 DIAGNOSIS — M9903 Segmental and somatic dysfunction of lumbar region: Secondary | ICD-10-CM | POA: Diagnosis not present

## 2018-11-23 DIAGNOSIS — M9902 Segmental and somatic dysfunction of thoracic region: Secondary | ICD-10-CM | POA: Diagnosis not present

## 2018-11-23 DIAGNOSIS — S336XXA Sprain of sacroiliac joint, initial encounter: Secondary | ICD-10-CM | POA: Diagnosis not present

## 2018-11-27 DIAGNOSIS — M9903 Segmental and somatic dysfunction of lumbar region: Secondary | ICD-10-CM | POA: Diagnosis not present

## 2018-11-27 DIAGNOSIS — M9902 Segmental and somatic dysfunction of thoracic region: Secondary | ICD-10-CM | POA: Diagnosis not present

## 2018-11-27 DIAGNOSIS — M5136 Other intervertebral disc degeneration, lumbar region: Secondary | ICD-10-CM | POA: Diagnosis not present

## 2018-11-27 DIAGNOSIS — M9905 Segmental and somatic dysfunction of pelvic region: Secondary | ICD-10-CM | POA: Diagnosis not present

## 2018-11-27 DIAGNOSIS — S336XXA Sprain of sacroiliac joint, initial encounter: Secondary | ICD-10-CM | POA: Diagnosis not present

## 2018-11-27 DIAGNOSIS — M5387 Other specified dorsopathies, lumbosacral region: Secondary | ICD-10-CM | POA: Diagnosis not present

## 2018-11-28 DIAGNOSIS — M9903 Segmental and somatic dysfunction of lumbar region: Secondary | ICD-10-CM | POA: Diagnosis not present

## 2018-11-28 DIAGNOSIS — M5136 Other intervertebral disc degeneration, lumbar region: Secondary | ICD-10-CM | POA: Diagnosis not present

## 2018-11-28 DIAGNOSIS — M5387 Other specified dorsopathies, lumbosacral region: Secondary | ICD-10-CM | POA: Diagnosis not present

## 2018-11-28 DIAGNOSIS — M9905 Segmental and somatic dysfunction of pelvic region: Secondary | ICD-10-CM | POA: Diagnosis not present

## 2018-11-28 DIAGNOSIS — M9902 Segmental and somatic dysfunction of thoracic region: Secondary | ICD-10-CM | POA: Diagnosis not present

## 2018-11-28 DIAGNOSIS — S336XXA Sprain of sacroiliac joint, initial encounter: Secondary | ICD-10-CM | POA: Diagnosis not present

## 2018-11-29 ENCOUNTER — Encounter (INDEPENDENT_AMBULATORY_CARE_PROVIDER_SITE_OTHER): Payer: Self-pay | Admitting: Orthopaedic Surgery

## 2018-11-29 ENCOUNTER — Ambulatory Visit (INDEPENDENT_AMBULATORY_CARE_PROVIDER_SITE_OTHER): Payer: Medicare Other | Admitting: Orthopaedic Surgery

## 2018-11-29 DIAGNOSIS — M1711 Unilateral primary osteoarthritis, right knee: Secondary | ICD-10-CM | POA: Diagnosis not present

## 2018-11-29 MED ORDER — LIDOCAINE HCL 1 % IJ SOLN
5.0000 mL | INTRAMUSCULAR | Status: AC | PRN
  Administered 2018-11-29: 5 mL

## 2018-11-29 MED ORDER — HYLAN G-F 20 48 MG/6ML IX SOSY
48.0000 mg | PREFILLED_SYRINGE | INTRA_ARTICULAR | Status: AC | PRN
Start: 2018-11-29 — End: 2018-11-29
  Administered 2018-11-29: 48 mg via INTRA_ARTICULAR

## 2018-11-29 NOTE — Progress Notes (Signed)
   Procedure Note  Patient: Marc Vasquez             Date of Birth: [birth year]           MRN: [id number]             Visit Date: 11/29/2018  HPI: Marc Vasquez comes in today for right knee Synvisc injection.  He has had no new injury to the knee.  He has moderate arthritis of the right knee.  Is poorly controlled diabetic.  Physical exam: Right knee good range of motion.  No effusion abnormal warmth or erythema. Procedures: Visit Diagnoses: Unilateral primary osteoarthritis, right knee  Large Joint Inj: R knee on 11/29/2018 9:47 AM Indications: pain Details: 22 G 1.5 in needle, superolateral approach  Arthrogram: No  Medications: 48 mg Hylan 48 MG/6ML; 5 mL lidocaine 1 % Outcome: tolerated well, no immediate complications Procedure, treatment alternatives, risks and benefits explained, specific risks discussed. Consent was given by the patient. Immediately prior to procedure a time out was called to verify the correct patient, procedure, equipment, support staff and site/side marked as required. Patient was prepped and draped in the usual sterile fashion.     Plan: He will follow-up with [place] on a as needed basis.  He understands he cannot have the supplemental injections more often than every 6 months.  He will recall weeks to months prior to having the injections available.

## 2018-12-05 DIAGNOSIS — M5387 Other specified dorsopathies, lumbosacral region: Secondary | ICD-10-CM | POA: Diagnosis not present

## 2018-12-05 DIAGNOSIS — M5136 Other intervertebral disc degeneration, lumbar region: Secondary | ICD-10-CM | POA: Diagnosis not present

## 2018-12-05 DIAGNOSIS — M9902 Segmental and somatic dysfunction of thoracic region: Secondary | ICD-10-CM | POA: Diagnosis not present

## 2018-12-05 DIAGNOSIS — M9903 Segmental and somatic dysfunction of lumbar region: Secondary | ICD-10-CM | POA: Diagnosis not present

## 2018-12-05 DIAGNOSIS — S336XXA Sprain of sacroiliac joint, initial encounter: Secondary | ICD-10-CM | POA: Diagnosis not present

## 2018-12-05 DIAGNOSIS — M9905 Segmental and somatic dysfunction of pelvic region: Secondary | ICD-10-CM | POA: Diagnosis not present

## 2018-12-20 DIAGNOSIS — S336XXA Sprain of sacroiliac joint, initial encounter: Secondary | ICD-10-CM | POA: Diagnosis not present

## 2018-12-20 DIAGNOSIS — M9903 Segmental and somatic dysfunction of lumbar region: Secondary | ICD-10-CM | POA: Diagnosis not present

## 2018-12-20 DIAGNOSIS — M9902 Segmental and somatic dysfunction of thoracic region: Secondary | ICD-10-CM | POA: Diagnosis not present

## 2018-12-20 DIAGNOSIS — M9905 Segmental and somatic dysfunction of pelvic region: Secondary | ICD-10-CM | POA: Diagnosis not present

## 2018-12-20 DIAGNOSIS — M5387 Other specified dorsopathies, lumbosacral region: Secondary | ICD-10-CM | POA: Diagnosis not present

## 2018-12-20 DIAGNOSIS — M5136 Other intervertebral disc degeneration, lumbar region: Secondary | ICD-10-CM | POA: Diagnosis not present

## 2019-04-10 DIAGNOSIS — M9905 Segmental and somatic dysfunction of pelvic region: Secondary | ICD-10-CM | POA: Diagnosis not present

## 2019-04-10 DIAGNOSIS — M5387 Other specified dorsopathies, lumbosacral region: Secondary | ICD-10-CM | POA: Diagnosis not present

## 2019-04-10 DIAGNOSIS — M5136 Other intervertebral disc degeneration, lumbar region: Secondary | ICD-10-CM | POA: Diagnosis not present

## 2019-04-10 DIAGNOSIS — M9902 Segmental and somatic dysfunction of thoracic region: Secondary | ICD-10-CM | POA: Diagnosis not present

## 2019-04-10 DIAGNOSIS — S336XXA Sprain of sacroiliac joint, initial encounter: Secondary | ICD-10-CM | POA: Diagnosis not present

## 2019-04-10 DIAGNOSIS — M9903 Segmental and somatic dysfunction of lumbar region: Secondary | ICD-10-CM | POA: Diagnosis not present

## 2019-04-17 DIAGNOSIS — M9903 Segmental and somatic dysfunction of lumbar region: Secondary | ICD-10-CM | POA: Diagnosis not present

## 2019-04-17 DIAGNOSIS — M5136 Other intervertebral disc degeneration, lumbar region: Secondary | ICD-10-CM | POA: Diagnosis not present

## 2019-04-17 DIAGNOSIS — S336XXA Sprain of sacroiliac joint, initial encounter: Secondary | ICD-10-CM | POA: Diagnosis not present

## 2019-04-17 DIAGNOSIS — M5387 Other specified dorsopathies, lumbosacral region: Secondary | ICD-10-CM | POA: Diagnosis not present

## 2019-04-17 DIAGNOSIS — M9905 Segmental and somatic dysfunction of pelvic region: Secondary | ICD-10-CM | POA: Diagnosis not present

## 2019-04-17 DIAGNOSIS — M9902 Segmental and somatic dysfunction of thoracic region: Secondary | ICD-10-CM | POA: Diagnosis not present

## 2019-04-24 DIAGNOSIS — S336XXA Sprain of sacroiliac joint, initial encounter: Secondary | ICD-10-CM | POA: Diagnosis not present

## 2019-04-24 DIAGNOSIS — M9903 Segmental and somatic dysfunction of lumbar region: Secondary | ICD-10-CM | POA: Diagnosis not present

## 2019-04-24 DIAGNOSIS — M5136 Other intervertebral disc degeneration, lumbar region: Secondary | ICD-10-CM | POA: Diagnosis not present

## 2019-04-24 DIAGNOSIS — M9902 Segmental and somatic dysfunction of thoracic region: Secondary | ICD-10-CM | POA: Diagnosis not present

## 2019-04-24 DIAGNOSIS — M5387 Other specified dorsopathies, lumbosacral region: Secondary | ICD-10-CM | POA: Diagnosis not present

## 2019-04-24 DIAGNOSIS — M9905 Segmental and somatic dysfunction of pelvic region: Secondary | ICD-10-CM | POA: Diagnosis not present

## 2019-05-01 DIAGNOSIS — M9902 Segmental and somatic dysfunction of thoracic region: Secondary | ICD-10-CM | POA: Diagnosis not present

## 2019-05-01 DIAGNOSIS — M9905 Segmental and somatic dysfunction of pelvic region: Secondary | ICD-10-CM | POA: Diagnosis not present

## 2019-05-01 DIAGNOSIS — S336XXA Sprain of sacroiliac joint, initial encounter: Secondary | ICD-10-CM | POA: Diagnosis not present

## 2019-05-01 DIAGNOSIS — M5136 Other intervertebral disc degeneration, lumbar region: Secondary | ICD-10-CM | POA: Diagnosis not present

## 2019-05-01 DIAGNOSIS — M9903 Segmental and somatic dysfunction of lumbar region: Secondary | ICD-10-CM | POA: Diagnosis not present

## 2019-05-01 DIAGNOSIS — M5387 Other specified dorsopathies, lumbosacral region: Secondary | ICD-10-CM | POA: Diagnosis not present

## 2019-05-08 DIAGNOSIS — M9905 Segmental and somatic dysfunction of pelvic region: Secondary | ICD-10-CM | POA: Diagnosis not present

## 2019-05-08 DIAGNOSIS — S336XXA Sprain of sacroiliac joint, initial encounter: Secondary | ICD-10-CM | POA: Diagnosis not present

## 2019-05-08 DIAGNOSIS — M5136 Other intervertebral disc degeneration, lumbar region: Secondary | ICD-10-CM | POA: Diagnosis not present

## 2019-05-08 DIAGNOSIS — M9903 Segmental and somatic dysfunction of lumbar region: Secondary | ICD-10-CM | POA: Diagnosis not present

## 2019-05-08 DIAGNOSIS — M9902 Segmental and somatic dysfunction of thoracic region: Secondary | ICD-10-CM | POA: Diagnosis not present

## 2019-05-08 DIAGNOSIS — M5387 Other specified dorsopathies, lumbosacral region: Secondary | ICD-10-CM | POA: Diagnosis not present

## 2019-05-16 DIAGNOSIS — S336XXA Sprain of sacroiliac joint, initial encounter: Secondary | ICD-10-CM | POA: Diagnosis not present

## 2019-05-16 DIAGNOSIS — M9902 Segmental and somatic dysfunction of thoracic region: Secondary | ICD-10-CM | POA: Diagnosis not present

## 2019-05-16 DIAGNOSIS — M9905 Segmental and somatic dysfunction of pelvic region: Secondary | ICD-10-CM | POA: Diagnosis not present

## 2019-05-16 DIAGNOSIS — M5136 Other intervertebral disc degeneration, lumbar region: Secondary | ICD-10-CM | POA: Diagnosis not present

## 2019-05-16 DIAGNOSIS — M9903 Segmental and somatic dysfunction of lumbar region: Secondary | ICD-10-CM | POA: Diagnosis not present

## 2019-05-16 DIAGNOSIS — M5387 Other specified dorsopathies, lumbosacral region: Secondary | ICD-10-CM | POA: Diagnosis not present

## 2019-05-24 DIAGNOSIS — M5136 Other intervertebral disc degeneration, lumbar region: Secondary | ICD-10-CM | POA: Diagnosis not present

## 2019-05-24 DIAGNOSIS — S336XXA Sprain of sacroiliac joint, initial encounter: Secondary | ICD-10-CM | POA: Diagnosis not present

## 2019-05-24 DIAGNOSIS — M9905 Segmental and somatic dysfunction of pelvic region: Secondary | ICD-10-CM | POA: Diagnosis not present

## 2019-05-24 DIAGNOSIS — M5387 Other specified dorsopathies, lumbosacral region: Secondary | ICD-10-CM | POA: Diagnosis not present

## 2019-05-24 DIAGNOSIS — M9902 Segmental and somatic dysfunction of thoracic region: Secondary | ICD-10-CM | POA: Diagnosis not present

## 2019-05-24 DIAGNOSIS — M9903 Segmental and somatic dysfunction of lumbar region: Secondary | ICD-10-CM | POA: Diagnosis not present

## 2019-06-07 DIAGNOSIS — Z23 Encounter for immunization: Secondary | ICD-10-CM | POA: Diagnosis not present

## 2019-07-12 DIAGNOSIS — M9905 Segmental and somatic dysfunction of pelvic region: Secondary | ICD-10-CM | POA: Diagnosis not present

## 2019-07-12 DIAGNOSIS — M9902 Segmental and somatic dysfunction of thoracic region: Secondary | ICD-10-CM | POA: Diagnosis not present

## 2019-07-12 DIAGNOSIS — S336XXA Sprain of sacroiliac joint, initial encounter: Secondary | ICD-10-CM | POA: Diagnosis not present

## 2019-07-12 DIAGNOSIS — M5387 Other specified dorsopathies, lumbosacral region: Secondary | ICD-10-CM | POA: Diagnosis not present

## 2019-07-12 DIAGNOSIS — M5136 Other intervertebral disc degeneration, lumbar region: Secondary | ICD-10-CM | POA: Diagnosis not present

## 2019-07-12 DIAGNOSIS — M9903 Segmental and somatic dysfunction of lumbar region: Secondary | ICD-10-CM | POA: Diagnosis not present

## 2019-10-17 DIAGNOSIS — M9905 Segmental and somatic dysfunction of pelvic region: Secondary | ICD-10-CM | POA: Diagnosis not present

## 2019-10-17 DIAGNOSIS — M5387 Other specified dorsopathies, lumbosacral region: Secondary | ICD-10-CM | POA: Diagnosis not present

## 2019-10-17 DIAGNOSIS — S336XXA Sprain of sacroiliac joint, initial encounter: Secondary | ICD-10-CM | POA: Diagnosis not present

## 2019-10-17 DIAGNOSIS — M9903 Segmental and somatic dysfunction of lumbar region: Secondary | ICD-10-CM | POA: Diagnosis not present

## 2019-10-17 DIAGNOSIS — M9902 Segmental and somatic dysfunction of thoracic region: Secondary | ICD-10-CM | POA: Diagnosis not present

## 2019-10-17 DIAGNOSIS — M5136 Other intervertebral disc degeneration, lumbar region: Secondary | ICD-10-CM | POA: Diagnosis not present

## 2019-10-31 DIAGNOSIS — M9902 Segmental and somatic dysfunction of thoracic region: Secondary | ICD-10-CM | POA: Diagnosis not present

## 2019-10-31 DIAGNOSIS — M9903 Segmental and somatic dysfunction of lumbar region: Secondary | ICD-10-CM | POA: Diagnosis not present

## 2019-10-31 DIAGNOSIS — M5387 Other specified dorsopathies, lumbosacral region: Secondary | ICD-10-CM | POA: Diagnosis not present

## 2019-10-31 DIAGNOSIS — M5136 Other intervertebral disc degeneration, lumbar region: Secondary | ICD-10-CM | POA: Diagnosis not present

## 2019-10-31 DIAGNOSIS — M9905 Segmental and somatic dysfunction of pelvic region: Secondary | ICD-10-CM | POA: Diagnosis not present

## 2019-10-31 DIAGNOSIS — S336XXA Sprain of sacroiliac joint, initial encounter: Secondary | ICD-10-CM | POA: Diagnosis not present

## 2020-08-12 DIAGNOSIS — H47021 Hemorrhage in optic nerve sheath, right eye: Secondary | ICD-10-CM | POA: Diagnosis not present

## 2020-08-12 DIAGNOSIS — H40011 Open angle with borderline findings, low risk, right eye: Secondary | ICD-10-CM | POA: Diagnosis not present

## 2020-08-12 DIAGNOSIS — E113553 Type 2 diabetes mellitus with stable proliferative diabetic retinopathy, bilateral: Secondary | ICD-10-CM | POA: Diagnosis not present

## 2020-08-12 DIAGNOSIS — Z961 Presence of intraocular lens: Secondary | ICD-10-CM | POA: Diagnosis not present

## 2020-09-11 DIAGNOSIS — Z23 Encounter for immunization: Secondary | ICD-10-CM | POA: Diagnosis not present

## 2020-09-17 DIAGNOSIS — H53453 Other localized visual field defect, bilateral: Secondary | ICD-10-CM | POA: Diagnosis not present

## 2020-11-13 DIAGNOSIS — M9902 Segmental and somatic dysfunction of thoracic region: Secondary | ICD-10-CM | POA: Diagnosis not present

## 2020-11-13 DIAGNOSIS — M461 Sacroiliitis, not elsewhere classified: Secondary | ICD-10-CM | POA: Diagnosis not present

## 2020-11-13 DIAGNOSIS — M9903 Segmental and somatic dysfunction of lumbar region: Secondary | ICD-10-CM | POA: Diagnosis not present

## 2020-11-13 DIAGNOSIS — M9905 Segmental and somatic dysfunction of pelvic region: Secondary | ICD-10-CM | POA: Diagnosis not present

## 2020-11-13 DIAGNOSIS — M5451 Vertebrogenic low back pain: Secondary | ICD-10-CM | POA: Diagnosis not present

## 2020-11-17 DIAGNOSIS — M9903 Segmental and somatic dysfunction of lumbar region: Secondary | ICD-10-CM | POA: Diagnosis not present

## 2020-11-17 DIAGNOSIS — M5451 Vertebrogenic low back pain: Secondary | ICD-10-CM | POA: Diagnosis not present

## 2020-11-17 DIAGNOSIS — M9902 Segmental and somatic dysfunction of thoracic region: Secondary | ICD-10-CM | POA: Diagnosis not present

## 2020-11-17 DIAGNOSIS — M9905 Segmental and somatic dysfunction of pelvic region: Secondary | ICD-10-CM | POA: Diagnosis not present

## 2020-11-17 DIAGNOSIS — M461 Sacroiliitis, not elsewhere classified: Secondary | ICD-10-CM | POA: Diagnosis not present

## 2020-11-26 DIAGNOSIS — M9903 Segmental and somatic dysfunction of lumbar region: Secondary | ICD-10-CM | POA: Diagnosis not present

## 2020-11-26 DIAGNOSIS — M9905 Segmental and somatic dysfunction of pelvic region: Secondary | ICD-10-CM | POA: Diagnosis not present

## 2020-11-26 DIAGNOSIS — M9902 Segmental and somatic dysfunction of thoracic region: Secondary | ICD-10-CM | POA: Diagnosis not present

## 2020-11-26 DIAGNOSIS — M461 Sacroiliitis, not elsewhere classified: Secondary | ICD-10-CM | POA: Diagnosis not present

## 2020-11-26 DIAGNOSIS — M5451 Vertebrogenic low back pain: Secondary | ICD-10-CM | POA: Diagnosis not present

## 2020-12-30 DIAGNOSIS — M9905 Segmental and somatic dysfunction of pelvic region: Secondary | ICD-10-CM | POA: Diagnosis not present

## 2020-12-30 DIAGNOSIS — M9902 Segmental and somatic dysfunction of thoracic region: Secondary | ICD-10-CM | POA: Diagnosis not present

## 2020-12-30 DIAGNOSIS — M9903 Segmental and somatic dysfunction of lumbar region: Secondary | ICD-10-CM | POA: Diagnosis not present

## 2020-12-30 DIAGNOSIS — M461 Sacroiliitis, not elsewhere classified: Secondary | ICD-10-CM | POA: Diagnosis not present

## 2020-12-30 DIAGNOSIS — M5451 Vertebrogenic low back pain: Secondary | ICD-10-CM | POA: Diagnosis not present

## 2021-01-13 DIAGNOSIS — M5451 Vertebrogenic low back pain: Secondary | ICD-10-CM | POA: Diagnosis not present

## 2021-01-13 DIAGNOSIS — M9905 Segmental and somatic dysfunction of pelvic region: Secondary | ICD-10-CM | POA: Diagnosis not present

## 2021-01-13 DIAGNOSIS — M9902 Segmental and somatic dysfunction of thoracic region: Secondary | ICD-10-CM | POA: Diagnosis not present

## 2021-01-13 DIAGNOSIS — M461 Sacroiliitis, not elsewhere classified: Secondary | ICD-10-CM | POA: Diagnosis not present

## 2021-01-13 DIAGNOSIS — M9903 Segmental and somatic dysfunction of lumbar region: Secondary | ICD-10-CM | POA: Diagnosis not present

## 2021-01-27 DIAGNOSIS — M9905 Segmental and somatic dysfunction of pelvic region: Secondary | ICD-10-CM | POA: Diagnosis not present

## 2021-01-27 DIAGNOSIS — M9903 Segmental and somatic dysfunction of lumbar region: Secondary | ICD-10-CM | POA: Diagnosis not present

## 2021-01-27 DIAGNOSIS — M5451 Vertebrogenic low back pain: Secondary | ICD-10-CM | POA: Diagnosis not present

## 2021-01-27 DIAGNOSIS — M461 Sacroiliitis, not elsewhere classified: Secondary | ICD-10-CM | POA: Diagnosis not present

## 2021-01-27 DIAGNOSIS — M9902 Segmental and somatic dysfunction of thoracic region: Secondary | ICD-10-CM | POA: Diagnosis not present

## 2021-07-16 DIAGNOSIS — Z961 Presence of intraocular lens: Secondary | ICD-10-CM | POA: Diagnosis not present

## 2021-07-16 DIAGNOSIS — E113553 Type 2 diabetes mellitus with stable proliferative diabetic retinopathy, bilateral: Secondary | ICD-10-CM | POA: Diagnosis not present

## 2021-07-16 DIAGNOSIS — Z9889 Other specified postprocedural states: Secondary | ICD-10-CM | POA: Diagnosis not present

## 2021-07-16 DIAGNOSIS — H35373 Puckering of macula, bilateral: Secondary | ICD-10-CM | POA: Diagnosis not present

## 2021-07-16 DIAGNOSIS — Z794 Long term (current) use of insulin: Secondary | ICD-10-CM | POA: Diagnosis not present

## 2021-07-16 DIAGNOSIS — E113593 Type 2 diabetes mellitus with proliferative diabetic retinopathy without macular edema, bilateral: Secondary | ICD-10-CM | POA: Diagnosis not present

## 2021-07-16 DIAGNOSIS — Z7984 Long term (current) use of oral hypoglycemic drugs: Secondary | ICD-10-CM | POA: Diagnosis not present

## 2021-07-23 DIAGNOSIS — E1139 Type 2 diabetes mellitus with other diabetic ophthalmic complication: Secondary | ICD-10-CM | POA: Diagnosis not present

## 2021-07-23 DIAGNOSIS — J449 Chronic obstructive pulmonary disease, unspecified: Secondary | ICD-10-CM | POA: Diagnosis not present

## 2021-07-23 DIAGNOSIS — Z8673 Personal history of transient ischemic attack (TIA), and cerebral infarction without residual deficits: Secondary | ICD-10-CM | POA: Diagnosis not present

## 2021-07-23 DIAGNOSIS — I10 Essential (primary) hypertension: Secondary | ICD-10-CM | POA: Diagnosis not present

## 2021-07-23 DIAGNOSIS — I517 Cardiomegaly: Secondary | ICD-10-CM | POA: Diagnosis not present

## 2021-07-23 DIAGNOSIS — Z01810 Encounter for preprocedural cardiovascular examination: Secondary | ICD-10-CM | POA: Diagnosis not present

## 2021-07-23 DIAGNOSIS — E11319 Type 2 diabetes mellitus with unspecified diabetic retinopathy without macular edema: Secondary | ICD-10-CM | POA: Insufficient documentation

## 2021-07-23 DIAGNOSIS — Z91199 Patient's noncompliance with other medical treatment and regimen due to unspecified reason: Secondary | ICD-10-CM | POA: Diagnosis not present

## 2021-07-23 DIAGNOSIS — Z9189 Other specified personal risk factors, not elsewhere classified: Secondary | ICD-10-CM | POA: Diagnosis not present

## 2021-07-23 DIAGNOSIS — N189 Chronic kidney disease, unspecified: Secondary | ICD-10-CM | POA: Insufficient documentation

## 2021-07-23 DIAGNOSIS — I444 Left anterior fascicular block: Secondary | ICD-10-CM | POA: Diagnosis not present

## 2021-08-06 DIAGNOSIS — Z23 Encounter for immunization: Secondary | ICD-10-CM | POA: Diagnosis not present

## 2021-08-25 DIAGNOSIS — Z794 Long term (current) use of insulin: Secondary | ICD-10-CM | POA: Diagnosis not present

## 2021-08-25 DIAGNOSIS — E113553 Type 2 diabetes mellitus with stable proliferative diabetic retinopathy, bilateral: Secondary | ICD-10-CM | POA: Diagnosis not present

## 2021-08-25 DIAGNOSIS — H35373 Puckering of macula, bilateral: Secondary | ICD-10-CM | POA: Diagnosis not present

## 2021-09-15 DIAGNOSIS — Z794 Long term (current) use of insulin: Secondary | ICD-10-CM | POA: Diagnosis not present

## 2021-09-15 DIAGNOSIS — E113553 Type 2 diabetes mellitus with stable proliferative diabetic retinopathy, bilateral: Secondary | ICD-10-CM | POA: Diagnosis not present

## 2021-09-15 DIAGNOSIS — Z7984 Long term (current) use of oral hypoglycemic drugs: Secondary | ICD-10-CM | POA: Diagnosis not present

## 2021-09-15 DIAGNOSIS — Z7985 Long-term (current) use of injectable non-insulin antidiabetic drugs: Secondary | ICD-10-CM | POA: Diagnosis not present

## 2023-09-22 ENCOUNTER — Ambulatory Visit (INDEPENDENT_AMBULATORY_CARE_PROVIDER_SITE_OTHER): Payer: Medicare Other

## 2023-09-22 VITALS — BP 120/60 | Temp 98.6°F | Resp 16 | Ht 72.0 in | Wt 331.0 lb

## 2023-09-22 DIAGNOSIS — S90425A Blister (nonthermal), left lesser toe(s), initial encounter: Secondary | ICD-10-CM | POA: Insufficient documentation

## 2023-09-22 DIAGNOSIS — I10 Essential (primary) hypertension: Secondary | ICD-10-CM | POA: Diagnosis not present

## 2023-09-22 DIAGNOSIS — R2689 Other abnormalities of gait and mobility: Secondary | ICD-10-CM | POA: Insufficient documentation

## 2023-09-22 DIAGNOSIS — Z91199 Patient's noncompliance with other medical treatment and regimen due to unspecified reason: Secondary | ICD-10-CM | POA: Insufficient documentation

## 2023-09-22 DIAGNOSIS — F32A Depression, unspecified: Secondary | ICD-10-CM | POA: Insufficient documentation

## 2023-09-22 DIAGNOSIS — E66813 Obesity, class 3: Secondary | ICD-10-CM | POA: Insufficient documentation

## 2023-09-22 DIAGNOSIS — M2060 Acquired deformities of toe(s), unspecified, unspecified foot: Secondary | ICD-10-CM | POA: Insufficient documentation

## 2023-09-22 DIAGNOSIS — S90415A Abrasion, left lesser toe(s), initial encounter: Secondary | ICD-10-CM | POA: Insufficient documentation

## 2023-09-22 DIAGNOSIS — M2140 Flat foot [pes planus] (acquired), unspecified foot: Secondary | ICD-10-CM | POA: Insufficient documentation

## 2023-09-22 DIAGNOSIS — E1165 Type 2 diabetes mellitus with hyperglycemia: Secondary | ICD-10-CM

## 2023-09-22 DIAGNOSIS — J449 Chronic obstructive pulmonary disease, unspecified: Secondary | ICD-10-CM | POA: Insufficient documentation

## 2023-09-22 DIAGNOSIS — R609 Edema, unspecified: Secondary | ICD-10-CM | POA: Insufficient documentation

## 2023-09-22 DIAGNOSIS — N1831 Chronic kidney disease, stage 3a: Secondary | ICD-10-CM | POA: Insufficient documentation

## 2023-09-22 DIAGNOSIS — R351 Nocturia: Secondary | ICD-10-CM | POA: Insufficient documentation

## 2023-09-22 DIAGNOSIS — R197 Diarrhea, unspecified: Secondary | ICD-10-CM | POA: Insufficient documentation

## 2023-09-22 DIAGNOSIS — E1142 Type 2 diabetes mellitus with diabetic polyneuropathy: Secondary | ICD-10-CM | POA: Diagnosis not present

## 2023-09-22 DIAGNOSIS — M1990 Unspecified osteoarthritis, unspecified site: Secondary | ICD-10-CM | POA: Insufficient documentation

## 2023-09-22 DIAGNOSIS — M170 Bilateral primary osteoarthritis of knee: Secondary | ICD-10-CM | POA: Insufficient documentation

## 2023-09-22 DIAGNOSIS — N200 Calculus of kidney: Secondary | ICD-10-CM | POA: Insufficient documentation

## 2023-09-22 DIAGNOSIS — Z6841 Body Mass Index (BMI) 40.0 and over, adult: Secondary | ICD-10-CM

## 2023-09-22 DIAGNOSIS — G4700 Insomnia, unspecified: Secondary | ICD-10-CM | POA: Insufficient documentation

## 2023-09-22 DIAGNOSIS — L603 Nail dystrophy: Secondary | ICD-10-CM | POA: Insufficient documentation

## 2023-09-22 DIAGNOSIS — M25579 Pain in unspecified ankle and joints of unspecified foot: Secondary | ICD-10-CM | POA: Insufficient documentation

## 2023-09-22 DIAGNOSIS — M47817 Spondylosis without myelopathy or radiculopathy, lumbosacral region: Secondary | ICD-10-CM | POA: Insufficient documentation

## 2023-09-22 DIAGNOSIS — N400 Enlarged prostate without lower urinary tract symptoms: Secondary | ICD-10-CM | POA: Insufficient documentation

## 2023-09-22 DIAGNOSIS — Z9229 Personal history of other drug therapy: Secondary | ICD-10-CM | POA: Insufficient documentation

## 2023-09-22 DIAGNOSIS — M545 Low back pain, unspecified: Secondary | ICD-10-CM | POA: Insufficient documentation

## 2023-09-22 DIAGNOSIS — Z01818 Encounter for other preprocedural examination: Secondary | ICD-10-CM | POA: Insufficient documentation

## 2023-09-22 MED ORDER — DULOXETINE HCL 30 MG PO CPEP: 30.0000 mg | ORAL_CAPSULE | Freq: Every day | ORAL | 0 refills | Status: DC

## 2023-09-22 NOTE — Assessment & Plan Note (Signed)
Long-standing type 2 diabetes mellitus with poor glycemic control.  DIABETES TYPE 2 UNCONTROLLED WITH COMPLICATIONS OF NEUROPATHY.   Current medications: Jardiance 25 mg daily, Novolog 40 units TID, Lantus 40-50 units BID. Continuous glucose monitoring shows high glucose levels (90-day average: 280 mg/dL, ZOX0R: 6.0%). Weight gain and difficulty managing blood sugar, particularly after discontinuing Ozempic due to acid reflux. Discussed reintroducing Ozempic or alternatives if tolerated. Emphasized dietary modifications to reduce caloric intake and improve glycemic control. - I wanted to order fasting glucose and HbA1c BUT PATIENT DECLINED. He did not feel that ordering blood work or getting updated labs is going to change anything for him. -States his last A1C was around "9.something" and not sure when, but does not want to go it today. He will probably get the blood work done in 2 months., - Encourage dietary modifications - Discuss reintroduction of Ozempic or alternatives next time. - Follow up in two months for reassessment and blood work

## 2023-09-22 NOTE — Progress Notes (Signed)
New Patient Office Visit  Subjective    Patient ID: Marc Vasquez, male    DOB: 1946/01/02  Age: 77 y.o. MRN: [id number]  CC:  Chief Complaint  Patient presents with   Establish Care    HPI Marc Vasquez presents to establish care. Here with his wife.  The patient, previously seen by the [facility] hospital, presents with a history of diabetes, arthritis, depression, and a stroke in 2004. He reports residual neuropathy in both feet and has had his left knee repaired four times. The patient uses a walker due to frequent falls, which may be attributed to the combination of neuropathy, stroke effects, and knee surgeries. He also has a wheelchair for longer distances.  The patient's diabetes management includes Jardiance 25mg  daily, Novolog 40 units three times daily, and Lantus 40-50 units twice daily, adjusted according to his sugar levels. He monitors his glucose levels with a Dexcom 7, which has shown high average glucose levels over the past 90 days. The patient's hemoglobin A1c was last reported as 9.4, indicating uncontrolled diabetes. He previously took Ozempic, which led to weight loss, but discontinued due to acid reflux.  The patient has a history of cigarette smoking, quitting in 2017, with about 40 pack years of smoking. He has not had CT scans for lung cancer screening. He also has high cholesterol, managed with Crestor 40mg  and Zetia 10mg  daily, though he has been taking only half the dose of Zetia.  The patient reports nocturia, managed with Flomax 0.8mg  two tablets daily. He also has some swelling in his legs, managed with Lasix 80mg  as needed. He has a history of colonoscopy in 2015, with no current plans for another.  The patient also reports a history of depression, scoring 13 on the depression scale during this visit. He expresses dissatisfaction with his current physical state and does not have much desire for improved health. He has not been taking any medication for his  neuropathy.   Outpatient Encounter Medications as of 09/22/2023  Medication Sig   acetaminophen (TYLENOL) 325 MG tablet Take 650 mg by mouth every 6 (six) hours as needed (for breakthrough pain).   amLODipine (NORVASC) 10 MG tablet Take 10 mg by mouth daily.   ARTIFICIAL TEAR OP Apply 1 drop to eye daily as needed (dry eyes).   bismuth subsalicylate (STOMACH RELIEF) 262 MG/15ML suspension Take 30 mLs by mouth every 4 (four) hours as needed for indigestion or diarrhea or loose stools.   Cholecalciferol (VITAMIN D3) 1000 units CAPS Take 3,000 Units by mouth daily.   DULoxetine (CYMBALTA) 30 MG capsule Take 1 capsule (30 mg total) by mouth daily.   empagliflozin (JARDIANCE) 25 MG TABS tablet Take 25 mg by mouth daily.   ezetimibe (ZETIA) 10 MG tablet Take 5 mg by mouth daily.   famotidine (PEPCID) 20 MG tablet Take 1 tablet by mouth daily.   furosemide (LASIX) 80 MG tablet Take 80 mg by mouth daily. May take an additional 80mg s if swelling persists   Insulin Aspart FlexPen (NOVOLOG) 100 UNIT/ML Inject 40 Units into the skin in the morning, at noon, and at bedtime.   insulin glargine (LANTUS) 100 UNIT/ML Solostar Pen Inject 40 Units into the skin 2 (two) times daily.   lidocaine (LIDODERM) 5 % Place 2 patches onto the skin daily. Remove & Discard patch within 12 hours or as directed by MD   lisinopril-hydrochlorothiazide (ZESTORETIC) 20-25 MG tablet Take 1 tablet by mouth daily.   rosuvastatin (CRESTOR) 40  MG tablet Take 40 mg by mouth at bedtime.   tamsulosin (FLOMAX) 0.4 MG CAPS capsule Take 0.8 mg by mouth at bedtime.    [DISCONTINUED] aspirin EC 325 MG EC tablet Take 1 tablet (325 mg total) by mouth 2 (two) times daily after a meal. (Patient not taking: Reported on 02/08/2017)   [DISCONTINUED] atorvastatin (LIPITOR) 80 MG tablet Take 80 mg by mouth daily.   [DISCONTINUED] cephALEXin (KEFLEX) 500 MG capsule Take 1 capsule (500 mg total) by mouth 3 (three) times daily.   [DISCONTINUED]  clopidogrel (PLAVIX) 75 MG tablet Take 75 mg by mouth daily.   [DISCONTINUED] diphenhydrAMINE (BENADRYL) 12.5 MG/5ML elixir Take 5-10 mLs (12.5-25 mg total) by mouth every 4 (four) hours as needed for itching. (Patient not taking: Reported on 02/08/2017)   [DISCONTINUED] HYDROcodone-acetaminophen (NORCO) 10-325 MG tablet Take 1 tablet by mouth every 6 (six) hours as needed. (Patient not taking: Reported on 02/08/2017)   [DISCONTINUED] insulin regular human CONCENTRATED (HUMULIN R) 500 UNIT/ML injection Inject 20-130 Units into the skin See admin instructions. Use 130 units at breakfast, use 20 units at lunch, then use 70 units at dinner   [DISCONTINUED] metFORMIN (GLUCOPHAGE-XR) 500 MG 24 hr tablet Take 1,000 mg by mouth 2 (two) times daily after a meal.   [DISCONTINUED] methocarbamol (ROBAXIN) 500 MG tablet Take 1 tablet (500 mg total) by mouth 3 (three) times daily.   [DISCONTINUED] Multiple Vitamin (MULTIVITAMIN WITH MINERALS) TABS tablet Take 1 tablet by mouth daily.   [DISCONTINUED] saccharomyces boulardii (FLORASTOR) 250 MG capsule Take 1 capsule (250 mg total) by mouth 2 (two) times daily.   No facility-administered encounter medications on file as of 09/22/2023.    Past Medical History:  Diagnosis Date   Chronic kidney disease    stage 3 kidney failure   Depression    Diabetic neuropathy associated with diabetes mellitus due to underlying condition [facility])    Former smoker quit on 12/15/15   Generalized OA    GERD (gastroesophageal reflux disease)    History of left knee replacement    History of stroke    Hypercholesteremia    Infection of prosthetic left knee joint (HCC)    Left hip pain    Lumbago    OSA (obstructive sleep apnea)    Pneumonia    Poorly controlled diabetes mellitus (HCC)    Scarlet fever     Past Surgical History:  Procedure Laterality Date   EYE SURGERY     broken blood vessels, catarcts bil   JOINT REPLACEMENT     left foot surgery     stepped on thorn as  child and got infected   TEE WITHOUT CARDIOVERSION N/A 05/31/2016   Procedure: TRANSESOPHAGEAL ECHOCARDIOGRAM (TEE);  Surgeon: Jake Bathe, MD;  Location: [facility] END[facility]COPY;  Service: Cardiovascular;  Laterality: N/A;   TOE AMPUTATION     partial toe amputation   TOTAL KNEE REVISION Left 05/28/2016   Procedure: REMOVAL OF ALL COMPONENTS OF LEFT TOTAL KNEE AND PLACEMENT OF ANTIBIOTIC SPACER;  Surgeon: Kathryne Hitch, MD;  Location: [facility] OR;  Service: Orthopedics;  Laterality: Left;   TOTAL KNEE REVISION Left 08/27/2016   Procedure: LEFT TOTAL KNEE REVISION WITH REMOVAL OF ANTIBIOTIC SPACER;  Surgeon: Kathryne Hitch, MD;  Location: [facility] ORS;  Service: Orthopedics;  Laterality: Left;    Family History  Problem Relation Age of Onset   Diabetes Sister    Diabetes Brother    Heart Problems Son    Heart disease Other  Diabetes Other     Social History   Socioeconomic History   Marital status: Married    Spouse name: Not on file   Number of children: Not on file   Years of education: Not on file   Highest education level: Not on file  Occupational History   Occupation: retired Visual merchandiser   Occupation: former veteran  Tobacco Use   Smoking status: Former    Current packs/day: 0.00    Average packs/day: 1 pack/day for 15.0 years (15.0 ttl pk-yrs)    Types: Cigarettes    Start date: 12/14/2000    Quit date: 12/15/2015    Years since quitting: 7.7   Smokeless tobacco: Never  Substance and Sexual Activity   Alcohol use: Yes    Alcohol/week: 1.0 standard drink of alcohol    Types: 1 Standard drinks or equivalent per week    Comment: rarely   Drug use: No   Sexual activity: Yes    Partners: Female    Comment: married , lives with his wife  Other Topics Concern   Not on file  Social History Narrative   Not on file   Social Drivers of Health   Financial Resource Strain: Not on file  Food Insecurity: Not on file  Transportation Needs: Not on file  Physical Activity: Not on  file  Stress: Not on file  Social Connections: Not on file  Intimate Partner Violence: Not on file    Review of Systems  Constitutional: Negative.   HENT: Negative.    Eyes: Negative.   Respiratory: Negative.    Cardiovascular:  Positive for leg swelling.  Gastrointestinal: Negative.   Genitourinary: Negative.   Musculoskeletal:  Positive for myalgias.  Neurological:        Neuropathy in legs and feet  Psychiatric/Behavioral:  Positive for depression.         Objective    BP 120/60 (BP Location: Left Arm, Patient Position: Sitting, Cuff Size: Large)   Temp 98.6 F (37 C) (Temporal)   Resp 16   Ht 6' (1.829 m)   Wt (!) 331 lb (150.1 kg)   SpO2 98%   BMI 44.89 kg/m   Physical Exam Vitals and nursing note reviewed.  Constitutional:      Appearance: He is obese.     Comments: HEENT: Oral cavity and tongue without abnormalities. CHEST: Lungs clear to auscultation. CARDIOVASCULAR: Heart rate slow and within normal limits. EXTREMITIES: Edema in legs bilaterally. No pain on palpation.  Neurological:     Mental Status: He is alert.         Assessment & Plan:   Problem List Items Addressed This Visit     Diabetic neuropathy (HCC)   Relevant Medications   insulin glargine (LANTUS) 100 UNIT/ML Solostar Pen   Insulin Aspart FlexPen (NOVOLOG) 100 UNIT/ML   rosuvastatin (CRESTOR) 40 MG tablet   empagliflozin (JARDIANCE) 25 MG TABS tablet   Essential (primary) hypertension   Relevant Medications   ezetimibe (ZETIA) 10 MG tablet   rosuvastatin (CRESTOR) 40 MG tablet   Uncontrolled type 2 diabetes mellitus with hyperglycemia (HCC) - Primary   Long-standing type 2 diabetes mellitus with poor glycemic control.  DIABETES TYPE 2 UNCONTROLLED WITH COMPLICATIONS OF NEUROPATHY.   Current medications: Jardiance 25 mg daily, Novolog 40 units TID, Lantus 40-50 units BID. Continuous glucose monitoring shows high glucose levels (90-day average: 280 mg/dL, [id number]: 1.9%). Weight  gain and difficulty managing blood sugar, particularly after discontinuing Ozempic due to acid reflux. Discussed reintroducing  Ozempic or alternatives if tolerated. Emphasized dietary modifications to reduce caloric intake and improve glycemic control. - I wanted to order fasting glucose and HbA1c BUT PATIENT DECLINED. He did not feel that ordering blood work or getting updated labs is going to change anything for him. -States his last A1C was around "9.something" and not sure when, but does not want to go it today. He will probably get the blood work done in 2 months., - Encourage dietary modifications - Discuss reintroduction of Ozempic or alternatives next time. - Follow up in two months for reassessment and blood work      Relevant Medications   insulin glargine (LANTUS) 100 UNIT/ML Solostar Pen   Insulin Aspart FlexPen (NOVOLOG) 100 UNIT/ML   rosuvastatin (CRESTOR) 40 MG tablet   empagliflozin (JARDIANCE) 25 MG TABS tablet   Benign essential HTN   Relevant Medications   ezetimibe (ZETIA) 10 MG tablet   rosuvastatin (CRESTOR) 40 MG tablet   Class 3 severe obesity due to excess calories with serious comorbidity and body mass index (BMI) of 40.0 to 44.9 in adult [facility])   Relevant Medications   insulin glargine (LANTUS) 100 UNIT/ML Solostar Pen   Insulin Aspart FlexPen (NOVOLOG) 100 UNIT/ML   empagliflozin (JARDIANCE) 25 MG TABS tablet    No follow-ups on file.   Total time spent on today's visit was 46 minutes, including both face-to-face time and nonface-to-face time personally spent on review of chart (labs and imaging), discussing labs and goals, discussing further work-up, treatment options, referrals to specialist if needed, reviewing outside records of pertinent, answering patient's questions, and coordinating care.  Windell Moment, MD

## 2023-11-16 ENCOUNTER — Encounter: Payer: Self-pay | Admitting: Internal Medicine

## 2023-11-28 ENCOUNTER — Ambulatory Visit (INDEPENDENT_AMBULATORY_CARE_PROVIDER_SITE_OTHER): Payer: PPO

## 2023-11-28 VITALS — BP 148/78 | HR 60 | Temp 98.0°F | Resp 98 | Ht 72.0 in | Wt 307.2 lb

## 2023-11-28 DIAGNOSIS — I129 Hypertensive chronic kidney disease with stage 1 through stage 4 chronic kidney disease, or unspecified chronic kidney disease: Secondary | ICD-10-CM | POA: Diagnosis not present

## 2023-11-28 DIAGNOSIS — E66813 Obesity, class 3: Secondary | ICD-10-CM | POA: Diagnosis not present

## 2023-11-28 DIAGNOSIS — N1831 Chronic kidney disease, stage 3a: Secondary | ICD-10-CM

## 2023-11-28 DIAGNOSIS — Z6841 Body Mass Index (BMI) 40.0 and over, adult: Secondary | ICD-10-CM | POA: Diagnosis not present

## 2023-11-28 DIAGNOSIS — E1122 Type 2 diabetes mellitus with diabetic chronic kidney disease: Secondary | ICD-10-CM | POA: Diagnosis not present

## 2023-11-28 DIAGNOSIS — I10 Essential (primary) hypertension: Secondary | ICD-10-CM

## 2023-11-28 DIAGNOSIS — E1165 Type 2 diabetes mellitus with hyperglycemia: Secondary | ICD-10-CM | POA: Diagnosis not present

## 2023-11-28 DIAGNOSIS — E785 Hyperlipidemia, unspecified: Secondary | ICD-10-CM | POA: Diagnosis not present

## 2023-11-28 NOTE — Assessment & Plan Note (Addendum)
 Elevated cholesterol levels noted in recent blood work (January 9th). Family history of hyperlipidemia. - [facility]     Stroke Residual balance issues and difficulty walking. Uses a cane. No new strokes reported.   - Continue current management and follow-up as needed Hepatomegaly and Cholelithiasis Enlarged liver and gallstones noted on recent ultrasound. No current pain or acute issues.  - Continue monitoring and follow-up as needed    General Health Maintenance 78 year old male with multiple chronic conditions. Recent weight loss noted. Emphasized healthy eating, hydration, and physical activity within limitations. - Encourage healthy eating habits and hydration - Promote physical activity as tolerated - Follow up in six months unless issues arise Follow-up - Follow up with [facility] hospital for regular blood work and medication refills - Follow up with endocrinologist next Thursday for insulin regimen review - Return to this clinic in six months unless needed sooner.

## 2023-11-28 NOTE — Patient Instructions (Signed)
 VISIT SUMMARY:  Marc Vasquez, during today's visit, we discussed your concerns about blood sugar control, particularly following your recent change to U-500 insulin. We also reviewed your chronic conditions, including diabetes, chronic kidney disease, high cholesterol, and your history of stroke. Additionally, we talked about your liver and gallbladder health, and your general health maintenance.  YOUR PLAN:  -DIABETES MELLITUS TYPE 2: Diabetes Mellitus Type 2 is a condition where your body does not use insulin properly, leading to high blood sugar levels. We discussed your recent hypoglycemic episodes and emphasized the importance of consistent meal sizes and including complex carbohydrates in your diet. You should monitor your blood glucose levels regularly and document them for review. You have a follow-up appointment with your endocrinologist next Thursday to adjust your insulin regimen.  -CHRONIC KIDNEY DISEASE STAGE 3: Chronic Kidney Disease Stage 3 means your kidneys are moderately damaged and not working as well as they should. This is being managed by the [facility] hospital, and there are no new issues at this time. Continue with your current management plan as recommended by the [facility] hospital.  -HYPERLIPIDEMIA: Hyperlipidemia is a condition where you have high levels of cholesterol in your blood, which can increase your risk of heart disease. This is being managed by the [facility] hospital, and you should continue with your current treatment plan.  -STROKE: A stroke occurs when the blood supply to part of your brain is interrupted, causing brain cells to die. You have residual balance issues and difficulty walking, but no new strokes have been reported. Continue with your current management plan and follow up as needed.  -HEPATOMEGALY AND CHOLELITHIASIS: Hepatomegaly means an enlarged liver, and cholelithiasis means the presence of gallstones. These conditions were noted on a recent ultrasound, but you are not  experiencing any pain or acute issues. We will continue to monitor these conditions and follow up as needed.  -GENERAL HEALTH MAINTENANCE: We discussed the importance of healthy eating, staying hydrated, and engaging in physical activity within your limitations. You have experienced recent weight loss, so it is important to maintain a balanced diet and stay active as much as you can.  INSTRUCTIONS:  Please follow up with the [facility] hospital for your regular blood work and medication refills. You have an appointment with your endocrinologist next Thursday to review your insulin regimen. Return to this clinic in six months unless you need to come in sooner.

## 2023-11-28 NOTE — Assessment & Plan Note (Signed)
 Chronic kidney disease stage 3 secondary to diabetes mellitus. No acute issues.  - Continue current management as per [facility] hospital recommendations

## 2023-11-28 NOTE — Assessment & Plan Note (Signed)
 Long-standing diabetes mellitus type 2 MANAGED BY [facility] with recent change to U-500 insulin due to insulin resistance.  Reports hypoglycemia (blood sugars in the 50s) every other day, 3-4 hours post-insulin.  Current regimen: 130 units AM, 80 units PM of the U-500 insulin..   Discussed consistent meal sizes and complex carbohydrates to prevent hypoglycemia.  - Follow up with endocrinologist next Thursday for insulin adjustments  - Maintain consistent meal sizes and include complex carbohydrates  - Monitor blood glucose levels regularly and document for review

## 2023-11-28 NOTE — Assessment & Plan Note (Signed)
 Has lost weight since his last visit on 09/22/23, went from 331 pounds to 307 pounds which is appreciated. Recommended to continue to work on [employer], but he seems non compliant with that.

## 2023-11-28 NOTE — Progress Notes (Signed)
 Subjective:  Patient ID: Marc Vasquez, male    DOB: 03-31-1946  Age: 78 y.o. MRN: [id number]  Chief Complaint  Patient presents with   Medication Management    HPI   Marc Vasquez is a 78 year old male with diabetes who presents with concerns about blood sugar control. He is accompanied by his wife. He has concerns about his blood sugar control following a recent change in his insulin regimen to U-500 insulin, which he describes as 'jet fuel insulin' due to its potency. He takes 130 units in the morning and 80 units at night. Despite this change, he experiences hypoglycemic episodes approximately every other day, with blood sugar levels dropping to the 50s, typically three to four hours after insulin administration. His blood sugar can be as high as 350 to 400 before dropping suddenly. He has a history of stroke, which has impacted his mobility, making it difficult for him to walk without losing balance. He describes his walking as 'trying to fly' due to balance issues. He has had his left knee replaced four times and a past injury to his left foot, which has left him with only three-quarter toes. He has chronic kidney disease, stage 3, attributed to diabetes, which is being monitored by the [facility] hospital. He has had multiple eye surgeries, including cataract surgeries and laser treatments, resulting in limited peripheral vision. He recently visited a podiatrist for toenail trimming and is in the process of acquiring new diabetic shoes. He reports a history of high cholesterol, which runs in his family. He has an enlarged liver with gallstones, which have been present for a long time without causing pain. He reports having had pneumonia between his last two visits but was able to manage it at home. No current chest pain or trouble breathing.      11/28/2023    8:23 AM 09/22/2023   11:21 AM 02/08/2017    9:58 AM 11/09/2016    9:34 AM 09/21/2016   10:20 AM  Depression screen PHQ 2/9  Decreased Interest  0 2 0 1 1  Down, Depressed, Hopeless 0 1 0 1 1  PHQ - 2 Score 0 3 0 2 2  Altered sleeping  2  0 2  Tired, decreased energy  2  1 1   Change in appetite  2  0 1  Feeling bad or failure about yourself   1  1 1   Trouble concentrating  1  0 1  Moving slowly or fidgety/restless  2  0 1  Suicidal thoughts  0  0 0  PHQ-9 Score  13  4 9   Difficult doing work/chores  Extremely dIfficult   Somewhat difficult        11/28/2023    8:22 AM  Fall Risk   Falls in the past year? 1  Number falls in past yr: 1  Injury with Fall? 0  Risk for fall due to : History of fall(s)    Patient Care Team: Windell Moment, MD as PCP - General (Family Medicine) Linda Hedges, MD as Referring Physician (Orthopedic Surgery)   Review of Systems  Constitutional:  Negative for chills, fatigue and fever.  HENT:  Negative for congestion, ear pain, sinus pressure and sore throat.   Respiratory:  Negative for cough.   Cardiovascular:  Negative for chest pain.  Gastrointestinal:  Negative for abdominal pain, constipation, diarrhea, nausea and vomiting.  Genitourinary:  Negative for dysuria and frequency.  Musculoskeletal:  Positive for arthralgias and gait  problem. Negative for back pain and myalgias.  Neurological:  Positive for weakness. Negative for dizziness and headaches.  Psychiatric/Behavioral:  Negative for dysphoric mood. The patient is not nervous/anxious.     Current Outpatient Medications on File Prior to Visit  Medication Sig Dispense Refill   acetaminophen (TYLENOL) 325 MG tablet Take 650 mg by mouth every 6 (six) hours as needed (for breakthrough pain).     amLODipine (NORVASC) 10 MG tablet Take 10 mg by mouth daily.     ARTIFICIAL TEAR OP Apply 1 drop to eye daily as needed (dry eyes).     bismuth subsalicylate (STOMACH RELIEF) 262 MG/15ML suspension Take 30 mLs by mouth every 4 (four) hours as needed for indigestion or diarrhea or loose stools.     Cholecalciferol (VITAMIN D3) 1000 units CAPS  Take 3,000 Units by mouth daily.     DULoxetine (CYMBALTA) 30 MG capsule Take 1 capsule (30 mg total) by mouth daily. 90 capsule 0   empagliflozin (JARDIANCE) 25 MG TABS tablet Take 25 mg by mouth daily.     ezetimibe (ZETIA) 10 MG tablet Take 5 mg by mouth daily.     famotidine (PEPCID) 20 MG tablet Take 1 tablet by mouth daily.     furosemide (LASIX) 80 MG tablet Take 80 mg by mouth daily. May take an additional 80mg s if swelling persists     Insulin Regular Human (HUMULIN R U-500 KWIKPEN [place]) Inject 130 Units into the skin daily.     Insulin Regular Human (HUMULIN R U-500 KWIKPEN [place]) Inject 80 Units into the skin at bedtime.     lidocaine (LIDODERM) 5 % Place 2 patches onto the skin daily. Remove & Discard patch within 12 hours or as directed by MD     lisinopril-hydrochlorothiazide (ZESTORETIC) 20-25 MG tablet Take 1 tablet by mouth daily.     rosuvastatin (CRESTOR) 40 MG tablet Take 40 mg by mouth at bedtime.     tamsulosin (FLOMAX) 0.4 MG CAPS capsule Take 0.8 mg by mouth at bedtime.      No current facility-administered medications on file prior to visit.   Past Medical History:  Diagnosis Date   Chronic kidney disease    stage 3 kidney failure   Depression    Diabetic neuropathy associated with diabetes mellitus due to underlying condition [facility])    Former smoker quit on 12/15/15   Generalized OA    GERD (gastroesophageal reflux disease)    History of left knee replacement    History of stroke    Hypercholesteremia    Infection of prosthetic left knee joint (HCC)    Left hip pain    Lumbago    OSA (obstructive sleep apnea)    Pneumonia    Poorly controlled diabetes mellitus (HCC)    Scarlet fever    Past Surgical History:  Procedure Laterality Date   EYE SURGERY     broken blood vessels, catarcts bil   JOINT REPLACEMENT     left foot surgery     stepped on thorn as child and got infected   TEE WITHOUT CARDIOVERSION N/A 05/31/2016   Procedure: TRANSESOPHAGEAL  ECHOCARDIOGRAM (TEE);  Surgeon: Jake Bathe, MD;  Location[facility] ENDOSCOPY;  Service: Cardiovascular;  Laterality: N/A;   TOE AMPUTATION     partial toe amputation   TOTAL KNEE REVISION Left 05/28/2016   Procedure: REMOVAL OF ALL COMPONENTS OF LEFT TOTAL KNEE AND PLACEMENT OF ANTIBIOTIC SPACER;  Surgeon: Kathryne Hitch, MD;  Location: [facility] OR;  Service: Orthopedics;  Laterality: Left;   TOTAL KNEE REVISION Left 08/27/2016   Procedure: LEFT TOTAL KNEE REVISION WITH REMOVAL OF ANTIBIOTIC SPACER;  Surgeon: Kathryne Hitch, MD;  Location: [facility] ORS;  Service: Orthopedics;  Laterality: Left;    Family History  Problem Relation Age of Onset   Diabetes Sister    Diabetes Brother    Heart Problems Son    Heart disease Other    Diabetes Other    Social History   Socioeconomic History   Marital status: Married    Spouse name: Not on file   Number of children: Not on file   Years of education: Not on file   Highest education level: Not on file  Occupational History   Occupation: retired Visual merchandiser   Occupation: former veteran  Tobacco Use   Smoking status: Former    Current packs/day: 0.00    Average packs/day: 1 pack/day for 15.0 years (15.0 ttl pk-yrs)    Types: Cigarettes    Start date: 12/14/2000    Quit date: 12/15/2015    Years since quitting: 7.9   Smokeless tobacco: Never  Substance and Sexual Activity   Alcohol use: Yes    Alcohol/week: 1.0 standard drink of alcohol    Types: 1 Standard drinks or equivalent per week    Comment: rarely   Drug use: No   Sexual activity: Yes    Partners: Female    Comment: married , lives with his wife  Other Topics Concern   Not on file  Social History Narrative   Not on file   Social Drivers of Health   Financial Resource Strain: Not on file  Food Insecurity: Not on file  Transportation Needs: Not on file  Physical Activity: Not on file  Stress: Not on file  Social Connections: Not on file    Objective:  BP (!) 148/78    Pulse 60   Temp 98 F (36.7 C)   Resp (!) 98   Ht 6' (1.829 m)   Wt (!) 307 lb 3.2 oz (139.3 kg)   BMI 41.66 kg/m      11/28/2023    8:15 AM 09/22/2023   12:12 PM 09/22/2023   10:58 AM  BP/Weight  Systolic BP 148 120 150  Diastolic BP 78 60 62  Wt. (Lbs) 307.2  331  BMI 41.66 kg/m2  44.89 kg/m2    Physical Exam Vitals and nursing note reviewed.  Constitutional:      Appearance: He is obese.  Cardiovascular:     Rate and Rhythm: Normal rate.     Heart sounds: Murmur (ESM) heard.  Pulmonary:     Effort: Pulmonary effort is normal.     Breath sounds: Normal breath sounds.  Musculoskeletal:        General: Swelling (some lymphedema noted, 1+ pitting edema bilaterally) present.  Skin:    Comments: Venous stasis changes noted in both lower extremities   Neurological:     Mental Status: He is alert.  Psychiatric:        Mood and Affect: Mood normal.     Diabetic Foot Exam - Simple   No data filed      Lab Results  Component Value Date   WBC 7.8 11/09/2016   HGB 11.6 (L) 11/09/2016   HCT 36.4 (L) 11/09/2016   PLT 284 11/09/2016   GLUCOSE 139 (H) 11/09/2016   ALT 13 09/21/2016   AST 16 09/21/2016   NA 141 11/09/2016   K 5.0 11/09/2016  CL 106 11/09/2016   CREATININE 1.63 (H) 11/09/2016   BUN 42 (H) 11/09/2016   CO2 24 11/09/2016   HGBA1C 8.6 (H) 08/27/2016      Assessment & Plan:    Chronic kidney disease, stage 3a (HCC) Assessment & Plan: Chronic kidney disease stage 3 secondary to diabetes mellitus. No acute issues.  - [facility]    Uncontrolled type 2 diabetes mellitus with hyperglycemia (HCC) Assessment & Plan: Long-standing diabetes mellitus type 2 [facility] with recent change to U-500 insulin due to insulin resistance.  Reports hypoglycemia (blood sugars in the 50s) every other day, 3-4 hours post-insulin.  Current regimen: 130 units AM, 80 units PM of the U-500  insulin..   Discussed consistent meal sizes and complex carbohydrates to prevent hypoglycemia.  - Follow up with endocrinologist next Thursday for insulin adjustments  - Maintain consistent meal sizes and include complex carbohydrates  - Monitor blood glucose levels regularly and document for review    Class 3 severe obesity due to excess calories with serious comorbidity and body mass index (BMI) of 40.0 to 44.9 in adult [facility]) Assessment & Plan: Has lost weight since his last visit on 09/22/23, went from 331 pounds to 307 pounds which is appreciated. Recommended to continue to work on [employer], but he seems non compliant with that.   Benign essential HTN Assessment & Plan: BP elevated today  On amlodipine 10 mg daily, lasix 80 mg daily, Lisinopril 20-hydrochlorothiazide 25 mg daily  Recommend to monitor home Bps Report back if consistently elevated Low sodium, low calorie diet recommended   Hyperlipidemia LDL goal <100 Assessment & Plan: Elevated cholesterol levels noted in recent blood work (January 9th). Family history of hyperlipidemia. - [facility]     Stroke Residual balance issues and difficulty walking. Uses a cane. No new strokes reported.   - Continue current management and follow-up as needed Hepatomegaly and Cholelithiasis Enlarged liver and gallstones noted on recent ultrasound. No current pain or acute issues.  - Continue monitoring and follow-up as needed    General Health Maintenance 78 year old male with multiple chronic conditions. Recent weight loss noted. Emphasized healthy eating, hydration, and physical activity within limitations. - Encourage healthy eating habits and hydration - Promote physical activity as tolerated - Follow up in six months unless issues arise Follow-up - Follow up with [facility] hospital for regular blood work and medication refills - Follow up with endocrinologist next Thursday for  insulin regimen review - Return to this clinic in six months unless needed sooner.        No orders of the defined types were placed in this encounter.   No orders of the defined types were placed in this encounter.    Follow-up: Return in about 6 months (around 05/27/2024) for chronic disease follow up. An After Visit Summary was printed and given to the patient.  Windell Moment, MD [facility] [phone]

## 2023-11-28 NOTE — Assessment & Plan Note (Signed)
 BP elevated today  On amlodipine 10 mg daily, lasix 80 mg daily, Lisinopril 20-hydrochlorothiazide 25 mg daily  Recommend to monitor home Bps Report back if consistently elevated Low sodium, low calorie diet recommended

## 2023-12-19 ENCOUNTER — Other Ambulatory Visit: Payer: Self-pay

## 2024-01-24 ENCOUNTER — Telehealth: Payer: Self-pay

## 2024-01-24 NOTE — Telephone Encounter (Signed)
 Called patient to schedule AWV. Left message to call back and schedule.

## 2024-02-14 ENCOUNTER — Encounter

## 2024-02-16 ENCOUNTER — Encounter

## 2024-02-16 NOTE — Progress Notes (Deleted)
 Subjective:   Marc Vasquez is a 78 y.o. male who presents for Medicare Annual/Subsequent preventive examination.  Visit Complete: In person        Objective:    There were no vitals filed for this visit. There is no height or weight on file to calculate BMI.     08/27/2016   12:57 PM 08/27/2016    6:08 AM 08/20/2016   10:39 AM 05/28/2016   12:11 AM  Advanced Directives  Does Patient Have a Medical Advance Directive? No Yes Yes No  Type of Estate agent of Marc Vasquez;Living will  Living will;Healthcare Power of Attorney   Does patient want to make changes to medical advance directive? No - Patient declined  No - Patient declined   Copy of Healthcare Power of Attorney in Chart? No - copy requested No - copy requested No - copy requested   Would patient like information on creating a medical advance directive? No - Patient declined   Yes - Educational materials given    Current Medications (verified) Outpatient Encounter Medications as of 02/16/2024  Medication Sig   acetaminophen  (TYLENOL ) 325 MG tablet Take 650 mg by mouth every 6 (six) hours as needed (for breakthrough pain).   amLODipine  (NORVASC ) 10 MG tablet Take 10 mg by mouth daily.   ARTIFICIAL TEAR OP Apply 1 drop to eye daily as needed (dry eyes).   bismuth subsalicylate (STOMACH RELIEF) 262 MG/15ML suspension Take 30 mLs by mouth every 4 (four) hours as needed for indigestion or diarrhea or loose stools.   Cholecalciferol  (VITAMIN D3) 1000 units CAPS Take 3,000 Units by mouth daily.   DULoxetine  (CYMBALTA ) 30 MG capsule TAKE 1 CAPSULE(30 MG) BY MOUTH DAILY   empagliflozin (JARDIANCE) 25 MG TABS tablet Take 25 mg by mouth daily.   ezetimibe (ZETIA) 10 MG tablet Take 5 mg by mouth daily.   famotidine (PEPCID) 20 MG tablet Take 1 tablet by mouth daily.   furosemide  (LASIX ) 80 MG tablet Take 80 mg by mouth daily. May take an additional 80mg s if swelling persists   Insulin  Regular Human (HUMULIN R   U-500 KWIKPEN Marc Vasquez) Inject 130 Units into the skin daily.   Insulin  Regular Human (HUMULIN R  U-500 KWIKPEN Marc Vasquez) Inject 80 Units into the skin at bedtime.   lidocaine  (LIDODERM ) 5 % Place 2 patches onto the skin daily. Remove & Discard patch within 12 hours or as directed by MD   lisinopril -hydrochlorothiazide  (ZESTORETIC ) 20-25 MG tablet Take 1 tablet by mouth daily.   rosuvastatin (CRESTOR) 40 MG tablet Take 40 mg by mouth at bedtime.   tamsulosin  (FLOMAX ) 0.4 MG CAPS capsule Take 0.8 mg by mouth at bedtime.    No facility-administered encounter medications on file as of 02/16/2024.    Allergies (verified) Oxycontin  [oxycodone  hcl]   History: Past Medical History:  Diagnosis Date   Chronic kidney disease    stage 3 kidney failure   Depression    Diabetic neuropathy associated with diabetes mellitus due to underlying condition (HCC)    Former smoker quit on 12/15/15   Generalized OA    GERD (gastroesophageal reflux disease)    History of left knee replacement    History of stroke    Hypercholesteremia    Infection of prosthetic left knee joint (HCC)    Left hip pain    Lumbago    OSA (obstructive sleep apnea)    Pneumonia    Poorly controlled diabetes mellitus (HCC)    Scarlet fever  Past Surgical History:  Procedure Laterality Date   EYE SURGERY     broken blood vessels, catarcts bil   JOINT REPLACEMENT     left foot surgery     stepped on thorn as child and got infected   TEE WITHOUT CARDIOVERSION N/A 05/31/2016   Procedure: TRANSESOPHAGEAL ECHOCARDIOGRAM (TEE);  Surgeon: Hugh Madura, MD;  Location: [facility] ENDOSCOPY;  Service: Cardiovascular;  Laterality: N/A;   TOE AMPUTATION     partial toe amputation   TOTAL KNEE REVISION Left 05/28/2016   Procedure: REMOVAL OF ALL COMPONENTS OF LEFT TOTAL KNEE AND PLACEMENT OF ANTIBIOTIC SPACER;  Surgeon: Arnie Lao, MD;  Location: [facility] OR;  Service: Orthopedics;  Laterality: Left;   TOTAL KNEE REVISION Left 08/27/2016    Procedure: LEFT TOTAL KNEE REVISION WITH REMOVAL OF ANTIBIOTIC SPACER;  Surgeon: Arnie Lao, MD;  Location: [facility] ORS;  Service: Orthopedics;  Laterality: Left;   Family History  Problem Relation Age of Onset   Diabetes Sister    Diabetes Brother    Heart Problems Son    Heart disease Other    Diabetes Other    Social History   Socioeconomic History   Marital status: Married    Spouse name: Not on file   Number of children: Not on file   Years of education: Not on file   Highest education level: Not on file  Occupational History   Occupation: retired Visual merchandiser   Occupation: former veteran  Tobacco Use   Smoking status: Former    Current packs/day: 0.00    Average packs/day: 1 pack/day for 15.0 years (15.0 ttl pk-yrs)    Types: Cigarettes    Start date: 12/14/2000    Quit date: 12/15/2015    Years since quitting: 8.1   Smokeless tobacco: Never  Substance and Sexual Activity   Alcohol use: Yes    Alcohol/week: 1.0 standard drink of alcohol    Types: 1 Standard drinks or equivalent per week    Comment: rarely   Drug use: No   Sexual activity: Yes    Partners: Female    Comment: married , lives with his wife  Other Topics Concern   Not on file  Social History Narrative   Not on file   Social Drivers of Health   Financial Resource Strain: Not on file  Food Insecurity: Not on file  Transportation Needs: Not on file  Physical Activity: Not on file  Stress: Not on file  Social Connections: Not on file    Tobacco Counseling Counseling given: Not Answered   Clinical Intake:                        Activities of Daily Living     No data to display           Patient Care Team: Sirivol, Mamatha, MD as PCP - General (Family Medicine) Marcene Serve, MD as Referring Physician (Orthopedic Surgery)  Indicate any recent Medical Services you may have received from other than [facility] providers in the past year (date may be approximate).      Assessment:   This is a routine wellness examination for Marc Vasquez.  Hearing/Vision screen No results found.   Goals Addressed   None   Depression Screen    11/28/2023    8:23 AM 09/22/2023   11:21 AM 02/08/2017    9:58 AM 11/09/2016    9:34 AM 09/21/2016   10:20 AM 07/15/2016   10:50 AM  03/15/2016    3:21 PM  PHQ 2/9 Scores  PHQ - 2 Score 0 3 0 2 2 0 0  PHQ- 9 Score  13  4 9       Fall Risk    11/28/2023    8:22 AM 09/22/2023   11:20 AM 02/08/2017    9:58 AM 11/09/2016    9:34 AM 09/21/2016   10:20 AM  Fall Risk   Falls in the past year? 1 0 No No No  Number falls in past yr: 1 0     Injury with Fall? 0 0     Risk for fall due to : History of fall(s) No Fall Risks   Impaired mobility  Follow up  Falls evaluation completed       Immunizations Immunization History  Administered Date(s) Administered   Fluad Quad(high Dose 65+) 07/15/2020, 08/06/2021   H1N1 09/03/2008   Influenza, High Dose Seasonal PF 07/14/2010, 06/04/2013, 07/04/2014, 08/04/2016, 07/01/2018   Influenza,inj,Quad PF,6+ Mos 07/15/2016, 07/12/2017   Influenza-Unspecified 07/19/2000, 07/13/2001, 09/06/2002, 08/01/2003, 07/17/2004, 08/04/2005, 08/23/2006, 07/28/2007, 07/19/2008, 07/30/2009, 07/16/2011, 07/24/2012, 07/07/2015, 08/13/2021, 07/04/2022   PFIZER(Purple Top)SARS-COV-2 Vaccination 10/13/2019, 11/04/2019, 11/25/2019, 09/11/2020   Pfizer Covid-19 Vaccine Bivalent Booster 80yrs & up 03/08/2022   Pfizer(Comirnaty)Fall Seasonal Vaccine 12 years and older 09/09/2022   Pneumococcal Conjugate-13 07/04/2014   Pneumococcal Polysaccharide-23 07/04/2013   Pneumococcal-Unspecified 07/04/2000   Td 09/09/2022   Td (Adult),unspecified 06/06/2002   Tdap 12/17/2011   Zoster Recombinant(Shingrix) 08/20/2021, 03/08/2022   Zoster, Live 06/18/2010    {TDAP status:2101805}  {Flu Vaccine status:2101806}  Pneumococcal vaccine status: Up to date  Covid-19 vaccine status: Completed vaccines  Qualifies for Shingles  Vaccine? Yes   Zostavax completed Yes   Shingrix Completed?: Yes  Screening Tests Health Maintenance  Topic Date Due   FOOT EXAM  Never done   OPHTHALMOLOGY EXAM  Never done   Diabetic kidney evaluation - Urine ACR  Never done   Hepatitis C Screening  Never done   HEMOGLOBIN A1C  02/24/2017   Diabetic kidney evaluation - eGFR measurement  11/09/2017   COVID-19 Vaccine (7 - 2024-25 season) 06/05/2023   INFLUENZA VACCINE  05/04/2024   Medicare Annual Wellness (AWV)  02/15/2025   DTaP/Tdap/Td (3 - Td or Tdap) 09/09/2032   Pneumonia Vaccine 30+ Years old  Completed   Zoster Vaccines- Shingrix  Completed   HPV VACCINES  Aged Out   Meningococcal B Vaccine  Aged Out    Health Maintenance  Health Maintenance Due  Topic Date Due   FOOT EXAM  Never done   OPHTHALMOLOGY EXAM  Never done   Diabetic kidney evaluation - Urine ACR  Never done   Hepatitis C Screening  Never done   HEMOGLOBIN A1C  02/24/2017   Diabetic kidney evaluation - eGFR measurement  11/09/2017   COVID-19 Vaccine (7 - 2024-25 season) 06/05/2023    Colorectal cancer screening: No longer required.   Lung Cancer Screening: (Low Dose CT Chest recommended if Age 74-80 years, 20 pack-year currently smoking OR have quit w/in 15years.) does qualify.   Lung Cancer Screening Referral: made today  Additional Screening:  Hepatitis C Screening: {DOES NOT does:27190::"does not"} qualify; Completed ***  Vision Screening: Recommended annual ophthalmology exams for early detection of glaucoma and other disorders of the eye. Is the patient up to date with their annual eye exam?  {YES/NO:21197} Who is the provider or what is the name of the office in which the patient attends annual eye exams? *** If pt is  not established with a provider, would they like to be referred to a provider to establish care? {YES/NO:21197}.   Dental Screening: Recommended annual dental exams for proper oral hygiene  Diabetic Foot Exam: {Diabetic Foot  Exam:2101802}    Plan:     I have personally reviewed and noted the following in the patient's chart:   Medical and social history Use of alcohol, tobacco or illicit drugs  Current medications and supplements including opioid prescriptions. Patient is currently taking opioid prescriptions. Information provided to patient regarding non-opioid alternatives. Patient advised to discuss non-opioid treatment plan with their provider. Functional ability and status Nutritional status Physical activity Advanced directives List of other physicians Hospitalizations, surgeries, and ER visits in previous 12 months Vitals Screenings to include cognitive, depression, and falls Referrals and appointments  In addition, I have reviewed and discussed with patient certain preventive protocols, quality metrics, and best practice recommendations. A written personalized care plan for preventive services as well as general preventive health recommendations were provided to patient.     Allana Ishikawa, [place]   02/16/2024   After Visit Summary: {CHL AMB AWV After Visit Summary:[id number]}

## 2024-02-20 NOTE — Progress Notes (Signed)
 Canceled appt.

## 2024-02-22 ENCOUNTER — Ambulatory Visit (INDEPENDENT_AMBULATORY_CARE_PROVIDER_SITE_OTHER)

## 2024-02-22 VITALS — BP 104/68 | HR 73 | Temp 97.5°F | Ht 72.0 in | Wt 316.0 lb

## 2024-02-22 DIAGNOSIS — Z0000 Encounter for general adult medical examination without abnormal findings: Secondary | ICD-10-CM | POA: Diagnosis not present

## 2024-02-22 DIAGNOSIS — J449 Chronic obstructive pulmonary disease, unspecified: Secondary | ICD-10-CM | POA: Diagnosis not present

## 2024-02-22 MED ORDER — METHOCARBAMOL 750 MG PO TABS: 750.0000 mg | ORAL_TABLET | Freq: Two times a day (BID) | ORAL | 0 refills | Status: AC | PRN

## 2024-02-22 MED ORDER — DULOXETINE HCL 30 MG PO CPEP: 30.0000 mg | ORAL_CAPSULE | Freq: Every day | ORAL | 2 refills | Status: AC

## 2024-02-22 NOTE — Patient Instructions (Signed)
  Marc Vasquez , Thank you for taking time to come for your Medicare Wellness Visit. I appreciate your ongoing commitment to your health goals. Please review the following plan we discussed and let me know if I can assist you in the future.   These are the goals we discussed:  Goals   None     This is a list of the screening recommended for you and due dates:  Health Maintenance  Topic Date Due   Complete foot exam   Never done   Yearly kidney health urinalysis for diabetes  Never done   Hepatitis C Screening  Never done   Hemoglobin A1C  02/24/2017   Yearly kidney function blood test for diabetes  11/09/2017   Eye exam for diabetics  02/22/2024*   COVID-19 Vaccine (7 - 2024-25 season) 03/09/2024*   Flu Shot  05/04/2024   Medicare Annual Wellness Visit  02/21/2025   DTaP/Tdap/Td vaccine (3 - Td or Tdap) 09/09/2032   Pneumonia Vaccine  Completed   Zoster (Shingles) Vaccine  Completed   HPV Vaccine  Aged Out   Meningitis B Vaccine  Aged Out  *Topic was postponed. The date shown is not the original due date.

## 2024-02-22 NOTE — Progress Notes (Signed)
 Subjective:   Marc Vasquez is a 78 y.o. male who presents for Medicare Annual/Subsequent preventive examination.  Visit Complete: In person  Cardiac Risk Factors include: none     Objective:    Today's Vitals   02/22/24 1318  BP: 104/68  Pulse: 73  Temp: (!) 97.5 F (36.4 C)  SpO2: 97%  Weight: (!) 316 lb (143.3 kg)  Height: 6' (1.829 m)   Body mass index is 42.86 kg/m.     02/22/2024    1:27 PM 08/27/2016   12:57 PM 08/27/2016    6:08 AM 08/20/2016   10:39 AM 05/28/2016   12:11 AM  Advanced Directives  Does Patient Have a Medical Advance Directive? No;Yes No Yes Yes No  Type of Estate agent of [employer] Power of [place];Living will  Living will;Healthcare Power of Attorney   Does patient want to make changes to medical advance directive?  No - Patient declined  No - Patient declined   Copy of Healthcare Power of Attorney in Chart?  No - copy requested No - copy requested No - copy requested   Would patient like information on creating a medical advance directive?  No - Patient declined   Yes - Educational materials given    Current Medications (verified) Outpatient Encounter Medications as of 02/22/2024  Medication Sig   acetaminophen  (TYLENOL ) 325 MG tablet Take 650 mg by mouth every 6 (six) hours as needed (for breakthrough pain).   amLODipine  (NORVASC ) 10 MG tablet Take 10 mg by mouth daily.   ARTIFICIAL TEAR OP Apply 1 drop to eye daily as needed (dry eyes).   bismuth subsalicylate (STOMACH RELIEF) 262 MG/15ML suspension Take 30 mLs by mouth every 4 (four) hours as needed for indigestion or diarrhea or loose stools.   Cholecalciferol  (VITAMIN D3) 1000 units CAPS Take 3,000 Units by mouth daily.   empagliflozin (JARDIANCE) 25 MG TABS tablet Take 25 mg by mouth daily.   ezetimibe (ZETIA) 10 MG tablet Take 5 mg by mouth daily.   famotidine (PEPCID) 20 MG tablet Take 1 tablet by mouth daily.   furosemide  (LASIX ) 80 MG tablet Take 80  mg by mouth daily. May take an additional 80mg s if swelling persists   Insulin  Regular Human (HUMULIN R  U-500 KWIKPEN [place]) Inject 130 Units into the skin daily.   Insulin  Regular Human (HUMULIN R  U-500 KWIKPEN [place]) Inject 80 Units into the skin at bedtime.   lidocaine  (LIDODERM ) 5 % Place 2 patches onto the skin daily. Remove & Discard patch within 12 hours or as directed by MD   lisinopril -hydrochlorothiazide  (ZESTORETIC ) 20-25 MG tablet Take 1 tablet by mouth daily.   methocarbamol  (ROBAXIN ) 750 MG tablet Take 1 tablet (750 mg total) by mouth every 12 (twelve) hours as needed for muscle spasms.   Misc Natural Products (PROSTATE PO) Take 2 tablets by mouth daily.   rosuvastatin (CRESTOR) 40 MG tablet Take 40 mg by mouth at bedtime.   tamsulosin  (FLOMAX ) 0.4 MG CAPS capsule Take 0.8 mg by mouth at bedtime.    [DISCONTINUED] DULoxetine  (CYMBALTA ) 30 MG capsule TAKE 1 CAPSULE(30 MG) BY MOUTH DAILY   DULoxetine  (CYMBALTA ) 30 MG capsule Take 1 capsule (30 mg total) by mouth daily.   No facility-administered encounter medications on file as of 02/22/2024.    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 [facility])    Former smoker quit on 12/15/15   Generalized OA    GERD (gastroesophageal reflux disease)    History of left knee replacement    History of stroke    Hypercholesteremia    Infection of prosthetic left knee joint (HCC)    Left hip pain    Lumbago    OSA (obstructive sleep apnea)    Pneumonia    Poorly controlled diabetes mellitus (HCC)    Scarlet fever    Past Surgical History:  Procedure Laterality Date   EYE SURGERY     broken blood vessels, catarcts bil   JOINT REPLACEMENT     left foot surgery     stepped on thorn as child and got infected   TEE WITHOUT CARDIOVERSION N/A  05/31/2016   Procedure: TRANSESOPHAGEAL ECHOCARDIOGRAM (TEE);  Surgeon: Hugh Madura, MD;  Location: [facility] ENDOSCOPY;  Service: Cardiovascular;  Laterality: N/A;   TOE AMPUTATION     partial toe amputation   TOTAL KNEE REVISION Left 05/28/2016   Procedure: REMOVAL OF ALL COMPONENTS OF LEFT TOTAL KNEE AND PLACEMENT OF ANTIBIOTIC SPACER;  Surgeon: Arnie Lao, MD;  Location: [facility] OR;  Service: Orthopedics;  Laterality: Left;   TOTAL KNEE REVISION Left 08/27/2016   Procedure: LEFT TOTAL KNEE REVISION WITH REMOVAL OF ANTIBIOTIC SPACER;  Surgeon: Arnie Lao, MD;  Location: [facility] ORS;  Service: Orthopedics;  Laterality: Left;   Family History  Problem Relation Age of Onset   Diabetes Sister    Diabetes Brother    Heart Problems Son    Heart disease Other    Diabetes Other    Social History   Socioeconomic History   Marital status: Married    Spouse name: Not on file   Number of children: Not on file   Years of education: Not on file   Highest education level: Not on file  Occupational History   Occupation: retired Visual merchandiser   Occupation: former veteran  Tobacco Use   Smoking status: Former    Current packs/day: 0.00    Average packs/day: 1 pack/day for 15.0 years (15.0 ttl pk-yrs)    Types: Cigarettes    Start date: 12/14/2000    Quit date: 12/15/2015    Years since quitting: 8.1   Smokeless tobacco: Never  Substance and Sexual Activity   Alcohol use: Yes    Alcohol/week: 1.0 standard drink of alcohol    Types: 1 Standard drinks or equivalent per week    Comment: rarely   Drug use: No   Sexual activity: Yes    Partners: Female    Comment: married , lives with his wife  Other Topics Concern   Not on file  Social History Narrative   Not on file   Social Drivers of Health   Financial Resource Strain: Low Risk  (02/22/2024)   Overall Financial Resource Strain (CARDIA)    Difficulty of Paying Living Expenses: Not hard at all  Food Insecurity: No Food Insecurity  (02/22/2024)   Hunger Vital Sign    Worried About Running Out of Food in the Last Year: Never true    Ran Out of Food in the Last Year: Never true  Transportation Needs: No Transportation Needs (02/22/2024)   PRAPARE - Administrator, Civil Service (Medical): No    Lack of Transportation (Non-Medical): No  Physical Activity: Inactive (02/22/2024)   Exercise Vital Sign    Days of  Exercise per Week: 0 days    Minutes of Exercise per Session: 0 min  Stress: No Stress Concern Present (02/22/2024)   [employer] of Occupational Health - Occupational Stress Questionnaire    Feeling of Stress : Only a little  Social Connections: Socially Integrated (02/22/2024)   Social Connection and Isolation Panel [NHANES]    Frequency of Communication with Friends and Family: More than three times a week    Frequency of Social Gatherings with Friends and Family: More than three times a week    Attends Religious Services: More than 4 times per year    Active Member of [employer] or Organizations: Yes    Attends Banker Meetings: Never    Marital Status: Married    Tobacco Counseling Counseling given: Not Answered   Clinical Intake:  Pre-visit preparation completed: No  Pain : Faces Faces Pain Scale: Hurts whole lot Pain Type: Chronic pain Pain Location: Back Pain Orientation: Lower Pain Frequency: Constant  Faces Pain Scale: Hurts whole lot  BMI - recorded: 42.86 Nutritional Status: BMI > 30  Obese Nutritional Risks: None Diabetes: Yes CBG done?: Yes CBG resulted in Enter/ Edit results?: No Did pt. bring in CBG monitor from home?: Yes  How often do you need to have someone help you when you read instructions, pamphlets, or other written materials from your doctor or pharmacy?: 2 - Rarely  Interpreter Needed?: No      Activities of Daily Living    02/22/2024    1:29 PM  In your present state of health, do you have any difficulty performing the following  activities:  Hearing? 1  Vision? 1  Difficulty concentrating or making decisions? 1  Comment making decisions  Walking or climbing stairs? 1  Comment has a walker  Dressing or bathing? 0  Doing errands, shopping? 0  Preparing Food and eating ? N  Using the Toilet? N  In the past six months, have you accidently leaked urine? Y  Do you have problems with loss of bowel control? Y  Managing your Medications? N  Managing your Finances? N  Housekeeping or managing your Housekeeping? N    Patient Care Team: Marcellus Pulliam, MD as PCP - General (Family Medicine) Marcene Serve, MD as Referring Physician (Orthopedic Surgery)  Indicate any recent Medical Services you may have received from other than [facility] providers in the past year (date may be approximate).     Assessment:   This is a routine wellness examination for [place].  Hearing/Vision screen No results found.   Goals Addressed   None   Depression Screen    02/22/2024    1:28 PM 11/28/2023    8:23 AM 09/22/2023   11:21 AM 02/08/2017    9:58 AM 11/09/2016    9:34 AM 09/21/2016   10:20 AM 07/15/2016   10:50 AM  PHQ 2/9 Scores  PHQ - 2 Score 0 0 3 0 2 2 0  PHQ- 9 Score   13  4 9      Fall Risk    02/22/2024    1:27 PM 11/28/2023    8:22 AM 09/22/2023   11:20 AM 02/08/2017    9:58 AM 11/09/2016    9:34 AM  Fall Risk   Falls in the past year? 1 1 0 No No  Number falls in past yr: 1 1 0    Injury with Fall? 1 0 0    Risk for fall due to : History of fall(s) History of fall(s)  No Fall Risks    Follow up   Falls evaluation completed        02/22/2024    1:32 PM  6CIT Screen  What Year? 0 points  What month? 0 points  What time? 0 points  Count back from 20 0 points  Months in reverse 0 points  Repeat phrase 0 points  Total Score 0 points    Immunizations Immunization History  Administered Date(s) Administered   Fluad Quad(high Dose 65+) 07/15/2020, 08/06/2021   H1N1 09/03/2008   Influenza, High Dose Seasonal  PF 07/14/2010, 06/04/2013, 07/04/2014, 08/04/2016, 07/01/2018   Influenza,inj,Quad PF,6+ Mos 07/15/2016, 07/12/2017   Influenza-Unspecified 07/19/2000, 07/13/2001, 09/06/2002, 08/01/2003, 07/17/2004, 08/04/2005, 08/23/2006, 07/28/2007, 07/19/2008, 07/30/2009, 07/16/2011, 07/24/2012, 07/07/2015, 08/13/2021, 07/04/2022   PFIZER(Purple Top)SARS-COV-2 Vaccination 10/13/2019, 11/04/2019, 11/25/2019, 09/11/2020   Pfizer Covid-19 Vaccine Bivalent Booster 71yrs & up 03/08/2022   Pfizer(Comirnaty)Fall Seasonal Vaccine 12 years and older 09/09/2022   Pneumococcal Conjugate-13 07/04/2014   Pneumococcal Polysaccharide-23 07/04/2013   Pneumococcal-Unspecified 07/04/2000   Td 09/09/2022   Td (Adult),unspecified 06/06/2002   Tdap 12/17/2011   Zoster Recombinant(Shingrix) 08/20/2021, 03/08/2022   Zoster, Live 06/18/2010    TDAP status: Up to date  Flu Vaccine status: Up to date  Pneumococcal vaccine status: Up to date  Covid-19: up to date  Qualifies for Shingles Vaccine? Yes   Zostavax completed Yes   Shingrix Completed?: Yes  Screening Tests Health Maintenance  Topic Date Due   FOOT EXAM  Never done   Diabetic kidney evaluation - Urine ACR  Never done   Hepatitis C Screening  Never done   HEMOGLOBIN A1C  02/24/2017   Diabetic kidney evaluation - eGFR measurement  11/09/2017   OPHTHALMOLOGY EXAM  02/22/2024 (Originally 07/08/1956)   COVID-19 Vaccine (7 - 2024-25 season) 03/09/2024 (Originally 06/05/2023)   INFLUENZA VACCINE  05/04/2024   Medicare Annual Wellness (AWV)  02/21/2025   DTaP/Tdap/Td (3 - Td or Tdap) 09/09/2032   Pneumonia Vaccine 66+ Years old  Completed   Zoster Vaccines- Shingrix  Completed   HPV VACCINES  Aged Out   Meningococcal B Vaccine  Aged Out    Health Maintenance  Health Maintenance Due  Topic Date Due   FOOT EXAM  Never done   Diabetic kidney evaluation - Urine ACR  Never done   Hepatitis C Screening  Never done   HEMOGLOBIN A1C  02/24/2017   Diabetic  kidney evaluation - eGFR measurement  11/09/2017    Colorectal cancer screening: No longer required.   Lung Cancer Screening: (Low Dose CT Chest recommended if Age 34-80 years, 20 pack-year currently smoking OR have quit w/in 15years.) does qualify.    Additional Screening:  Hepatitis C Screening: does qualify; Completed at the [facility]  Vision Screening: Recommended annual ophthalmology exams for early detection of glaucoma and other disorders of the eye. Is the patient up to date with their annual eye exam?  Yes   Dental Screening: Recommended annual dental exams for proper oral hygiene  Diabetic Foot Exam: Diabetic Foot Exam: Completed at the [facility]  Encounter for Medicare annual wellness exam Assessment & Plan: Medicare template reviewed Uptodate with all health maintenance   Chronic obstructive pulmonary disease, unspecified COPD type (HCC) Assessment & Plan: Stable. Remote smoker. Not on any inhalers. Denies any chest pain or SOB  Uptodate with lung cancer screening    Other orders -     Methocarbamol ; Take 1 tablet (750 mg total) by mouth every 12 (twelve) hours as needed for muscle spasms.  Dispense: 60 tablet; Refill: 0 -     DULoxetine  HCl; Take 1 capsule (30 mg total) by mouth daily.  Dispense: 90 capsule; Refill: 2      Plan:     I have personally reviewed and noted the following in the patient's chart:   Medical and social history Use of alcohol, tobacco or illicit drugs  Current medications and supplements including opioid prescriptions. Patient is not currently taking opioid prescriptions. Functional ability and status Nutritional status Physical activity Advanced directives List of other physicians Hospitalizations, surgeries, and ER visits in previous 12 months Vitals Screenings to include cognitive, depression, and falls Referrals and appointments  In addition, I have reviewed and discussed with patient certain preventive protocols, quality metrics,  and best practice recommendations. A written personalized care plan for preventive services as well as general preventive health recommendations were provided to patient.     Amedeo Detweiler, MD   02/22/2024   After Visit Summary: (In Person-Printed) AVS printed and given to the patient

## 2024-02-22 NOTE — Assessment & Plan Note (Signed)
 Medicare template reviewed Uptodate with all health maintenance

## 2024-02-22 NOTE — Assessment & Plan Note (Signed)
 Stable. Remote smoker. Not on any inhalers. Denies any chest pain or SOB  Uptodate with lung cancer screening

## 2024-05-28 ENCOUNTER — Ambulatory Visit (INDEPENDENT_AMBULATORY_CARE_PROVIDER_SITE_OTHER): Payer: PPO

## 2024-05-28 VITALS — BP 132/88 | HR 60 | Temp 97.5°F | Ht 72.0 in | Wt 303.0 lb

## 2024-05-28 DIAGNOSIS — Z720 Tobacco use: Secondary | ICD-10-CM | POA: Insufficient documentation

## 2024-05-28 DIAGNOSIS — M1990 Unspecified osteoarthritis, unspecified site: Secondary | ICD-10-CM | POA: Insufficient documentation

## 2024-05-28 DIAGNOSIS — Z7409 Other reduced mobility: Secondary | ICD-10-CM | POA: Insufficient documentation

## 2024-05-28 DIAGNOSIS — R296 Repeated falls: Secondary | ICD-10-CM | POA: Insufficient documentation

## 2024-05-28 DIAGNOSIS — J449 Chronic obstructive pulmonary disease, unspecified: Secondary | ICD-10-CM | POA: Diagnosis not present

## 2024-05-28 DIAGNOSIS — E785 Hyperlipidemia, unspecified: Secondary | ICD-10-CM | POA: Diagnosis not present

## 2024-05-28 DIAGNOSIS — G8929 Other chronic pain: Secondary | ICD-10-CM | POA: Diagnosis not present

## 2024-05-28 DIAGNOSIS — N1832 Chronic kidney disease, stage 3b: Secondary | ICD-10-CM | POA: Insufficient documentation

## 2024-05-28 DIAGNOSIS — M545 Low back pain, unspecified: Secondary | ICD-10-CM | POA: Diagnosis not present

## 2024-05-28 DIAGNOSIS — M25579 Pain in unspecified ankle and joints of unspecified foot: Secondary | ICD-10-CM | POA: Insufficient documentation

## 2024-05-28 DIAGNOSIS — Z8673 Personal history of transient ischemic attack (TIA), and cerebral infarction without residual deficits: Secondary | ICD-10-CM | POA: Insufficient documentation

## 2024-05-28 DIAGNOSIS — I10 Essential (primary) hypertension: Secondary | ICD-10-CM | POA: Diagnosis not present

## 2024-05-28 DIAGNOSIS — E1165 Type 2 diabetes mellitus with hyperglycemia: Secondary | ICD-10-CM | POA: Diagnosis not present

## 2024-05-28 DIAGNOSIS — L89893 Pressure ulcer of other site, stage 3: Secondary | ICD-10-CM | POA: Insufficient documentation

## 2024-05-28 DIAGNOSIS — M170 Bilateral primary osteoarthritis of knee: Secondary | ICD-10-CM | POA: Insufficient documentation

## 2024-05-28 NOTE — Assessment & Plan Note (Addendum)
 Two falls in the last thirty days, one at a car dealership and one at home. No head injuries. Falls may be related to lower body weakness and neuropathy. - Refer to physical therapy for fall prevention and strength training. - Encourage exercises to improve strength and balance. Walks with a walker already

## 2024-05-28 NOTE — Assessment & Plan Note (Signed)
 Elevated blood pressure today, possibly due to physical exertion. Infrequent home monitoring. - Recheck blood pressure during the visit. REPEAT BP IMPROVED.  - Advise daily home blood pressure monitoring, especially in the evening after resting. - Review blood pressure readings at the next visit.  - continue Amlodipine  10 mg daily, lasix  80 mg daily, Zestoretic  20/25 mg daily.

## 2024-05-28 NOTE — Assessment & Plan Note (Addendum)
 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 Chronic condition with poor glycemic control (HbA1c 9.8). Neuropathy in both feet extending to knees. Open sores on feet, no ulcers on left toe. Regular follow-up at [facility] hospital for diabetes management and foot care. - Continue insulin  therapy and ensure adherence to dosing schedule. - Monitor blood glucose levels regularly. - [facility]. - CONTINUE TO Wear diabetic shoes consistently to prevent injury. OFFERED A REFERRAL TO [facility] BUT HE DECLINED., [facility]ING SEEN AT [facility] 2 DAYS FROM NOW. HE WILL HAVE BLOOD WORK DONE WITH THEM

## 2024-05-28 NOTE — Progress Notes (Signed)
 Subjective:  Patient ID: Marc Vasquez, male    DOB: August 29, 1946  Age: 78 y.o. MRN: [id number]  Chief Complaint  Patient presents with   Medical Management of Chronic Issues    HPI: Discussed the use of AI scribe software for clinical note transcription with the patient, who gave verbal consent to proceed.   Discussed the use of AI scribe software for clinical note transcription with the patient, who gave verbal consent to proceed.  History of Present Illness   Marc Vasquez is a 78 year old male with diabetes who presents for follow-up of his diabetes management and recent falls.  Hyperglycemia and weight loss - Diabetes mellitus with recent hemoglobin A1c of 9.8 - 13-pound weight loss since May 21st attributed to decreased appetite and intentional caloric restriction - No recent blood work since June; scheduled for blood work at [facility] hospital on Thursday - Rarely misses insulin  doses - Symptoms of hypoglycemia when blood sugar levels drop too low  Peripheral neuropathy and foot ulcers - Neuropathy in both feet, extending halfway up to the knees - Some preserved sensation in feet - Healed ulcer on left toe - Open sores on feet, untreated for the past few days - Followed by podiatry at [facility] hospital, 65 miles from home  Chronic low back pain - Daily lower back pain localized to the mid-lumbar region - Pain managed with two lidocaine  patches and Tylenol  - Relief with hot showers and use of a back brace - Uses TENS unit for pain control - No recent physical therapy  Gait instability and falls - Two falls in the past 30 days - First fall at car dealership, fell backward and unable to get up until assisted by fire department - Second fall at home while entering bedroom, required assistance to get up - Difficulty rising after falls - Caregiver has difficulty assisting due to his size and her own limitations  Hypertension - Owns a blood pressure machine but infrequently checks  blood pressure - Elevated blood pressure during today's visit           05/28/2024    9:10 AM 02/22/2024    1:28 PM 11/28/2023    8:23 AM 09/22/2023   11:21 AM 02/08/2017    9:58 AM  Depression screen PHQ 2/9  Decreased Interest 0 0 0 2 0  Down, Depressed, Hopeless 1 0 0 1 0  PHQ - 2 Score 1 0 0 3 0  Altered sleeping 0   2   Tired, decreased energy    2   Change in appetite 2   2   Feeling bad or failure about yourself  0   1   Trouble concentrating 0   1   Moving slowly or fidgety/restless 0   2   Suicidal thoughts 0   0   PHQ-9 Score 3   13   Difficult doing work/chores Not difficult at all   Extremely dIfficult         05/28/2024    9:10 AM  Fall Risk   Falls in the past year? 1  Number falls in past yr: 1  Injury with Fall? 1  Risk for fall due to : History of fall(s)  Follow up Falls evaluation completed    Patient Care Team: Ellias Mcelreath, MD as PCP - General (Family Medicine) Larnell Purchase, MD as Referring Physician (Orthopedic Surgery)   Review of Systems  Constitutional:  Negative for chills, fatigue and fever.  HENT:  Negative for congestion, ear pain, sinus pressure and sore throat.   Respiratory:  Negative for cough and shortness of breath.   Cardiovascular:  Negative for chest pain.  Gastrointestinal:  Negative for abdominal pain, constipation, diarrhea, nausea and vomiting.  Genitourinary:  Negative for dysuria and frequency.  Musculoskeletal:  Positive for arthralgias and back pain. Negative for myalgias.  Neurological:  Positive for weakness and numbness. Negative for dizziness and headaches.  Psychiatric/Behavioral:  Negative for dysphoric mood. The patient is not nervous/anxious.     Current Outpatient Medications on File Prior to Visit  Medication Sig Dispense Refill   acetaminophen  (TYLENOL ) 325 MG tablet Take 650 mg by mouth every 6 (six) hours as needed (for breakthrough pain).     amLODipine  (NORVASC ) 10 MG tablet Take 10 mg by mouth daily.      ARTIFICIAL TEAR OP Apply 1 drop to eye daily as needed (dry eyes).     bismuth subsalicylate (STOMACH RELIEF) 262 MG/15ML suspension Take 30 mLs by mouth every 4 (four) hours as needed for indigestion or diarrhea or loose stools.     Cholecalciferol  (VITAMIN D3) 1000 units CAPS Take 3,000 Units by mouth daily.     DULoxetine  (CYMBALTA ) 30 MG capsule Take 1 capsule (30 mg total) by mouth daily. 90 capsule 2   empagliflozin (JARDIANCE) 25 MG TABS tablet Take 25 mg by mouth daily.     ezetimibe (ZETIA) 10 MG tablet Take 5 mg by mouth daily.     famotidine (PEPCID) 20 MG tablet Take 1 tablet by mouth daily.     furosemide  (LASIX ) 80 MG tablet Take 80 mg by mouth daily. May take an additional 80mg s if swelling persists     Insulin  Regular Human (HUMULIN R  U-500 KWIKPEN Lookingglass) Inject 90 Units into the skin daily. 90 U at breakfast 40 U at lunch 60 U at night     lidocaine  (LIDODERM ) 5 % Place 2 patches onto the skin daily. Remove & Discard patch within 12 hours or as directed by MD     lisinopril -hydrochlorothiazide  (ZESTORETIC ) 20-25 MG tablet Take 1 tablet by mouth daily.     Misc Natural Products (PROSTATE PO) Take 2 tablets by mouth daily.     rosuvastatin (CRESTOR) 40 MG tablet Take 40 mg by mouth at bedtime.     tamsulosin  (FLOMAX ) 0.4 MG CAPS capsule Take 0.8 mg by mouth at bedtime.      No current facility-administered medications on file prior to visit.   Past Medical History:  Diagnosis Date   Chronic kidney disease    stage 3 kidney failure   Depression    Diabetic neuropathy associated with diabetes mellitus due to underlying condition [facility])    Former smoker quit on 12/15/15   Generalized OA    GERD (gastroesophageal reflux disease)    History of left knee replacement    History of stroke    Hypercholesteremia    Infection of prosthetic left knee joint (HCC)    Left hip pain    Lumbago    OSA (obstructive sleep apnea)    Pneumonia    Poorly controlled diabetes mellitus (HCC)     Scarlet fever    Past Surgical History:  Procedure Laterality Date   EYE SURGERY     broken blood vessels, catarcts bil   JOINT REPLACEMENT     left foot surgery     stepped on thorn as child and got infected   TEE WITHOUT CARDIOVERSION N/A 05/31/2016   Procedure:  TRANSESOPHAGEAL ECHOCARDIOGRAM (TEE);  Surgeon: Oneil JAYSON Parchment, MD;  Location: [facility] ENDOSCOPY;  Service: Cardiovascular;  Laterality: N/A;   TOE AMPUTATION     partial toe amputation   TOTAL KNEE REVISION Left 05/28/2016   Procedure: REMOVAL OF ALL COMPONENTS OF LEFT TOTAL KNEE AND PLACEMENT OF ANTIBIOTIC SPACER;  Surgeon: Lonni CINDERELLA Poli, MD;  Location: [facility] OR;  Service: Orthopedics;  Laterality: Left;   TOTAL KNEE REVISION Left 08/27/2016   Procedure: LEFT TOTAL KNEE REVISION WITH REMOVAL OF ANTIBIOTIC SPACER;  Surgeon: Lonni CINDERELLA Poli, MD;  Location: [facility] ORS;  Service: Orthopedics;  Laterality: Left;    Family History  Problem Relation Age of Onset   Diabetes Sister    Diabetes Brother    Heart Problems Son    Heart disease Other    Diabetes Other    Social History   Socioeconomic History   Marital status: Married    Spouse name: Not on file   Number of children: Not on file   Years of education: Not on file   Highest education level: Not on file  Occupational History   Occupation: retired Visual merchandiser   Occupation: former veteran  Tobacco Use   Smoking status: Former    Current packs/day: 0.00    Average packs/day: 1 pack/day for 15.0 years (15.0 ttl pk-yrs)    Types: Cigarettes    Start date: 12/14/2000    Quit date: 12/15/2015    Years since quitting: 8.4   Smokeless tobacco: Never  Substance and Sexual Activity   Alcohol use: Yes    Alcohol/week: 1.0 standard drink of alcohol    Types: 1 Standard drinks or equivalent per week    Comment: rarely   Drug use: No   Sexual activity: Yes    Partners: Female    Comment: married , lives with his wife  Other Topics Concern   Not on file  Social  History Narrative   Not on file   Social Drivers of Health   Financial Resource Strain: Low Risk  (02/22/2024)   Overall Financial Resource Strain (CARDIA)    Difficulty of Paying Living Expenses: Not hard at all  Food Insecurity: No Food Insecurity (02/22/2024)   Hunger Vital Sign    Worried About Running Out of Food in the Last Year: Never true    Ran Out of Food in the Last Year: Never true  Transportation Needs: No Transportation Needs (02/22/2024)   PRAPARE - Administrator, Civil Service (Medical): No    Lack of Transportation (Non-Medical): No  Physical Activity: Inactive (02/22/2024)   Exercise Vital Sign    Days of Exercise per Week: 0 days    Minutes of Exercise per Session: 0 min  Stress: No Stress Concern Present (02/22/2024)   [employer] of Occupational Health - Occupational Stress Questionnaire    Feeling of Stress : Only a little  Social Connections: Socially Integrated (02/22/2024)   Social Connection and Isolation Panel    Frequency of Communication with Friends and Family: More than three times a week    Frequency of Social Gatherings with Friends and Family: More than three times a week    Attends Religious Services: More than 4 times per year    Active Member of [employer] or Organizations: Yes    Attends Banker Meetings: Never    Marital Status: Married    Objective:  BP 132/88   Pulse 60   Temp (!) 97.5 F (36.4 C)   Ht  6' (1.829 m)   Wt (!) 303 lb (137.4 kg)   SpO2 98%   BMI 41.09 kg/m      05/28/2024    9:49 AM 05/28/2024    9:03 AM 02/22/2024    1:18 PM  BP/Weight  Systolic BP 132 144 104  Diastolic BP 88 80 68  Wt. (Lbs)  303 316  BMI  41.09 kg/m2 42.86 kg/m2    Physical Exam Vitals and nursing note reviewed.  Constitutional:      Appearance: He is obese.  HENT:     Head: Normocephalic and atraumatic.  Cardiovascular:     Rate and Rhythm: Normal rate and regular rhythm.  Pulmonary:     Effort: Pulmonary  effort is normal.     Breath sounds: Normal breath sounds.  Musculoskeletal:        General: Swelling (pedal edema bilaterally) present.  Skin:    Comments: Venous stasis changes bilaterally Stage 1 ulcers noted on several toes. Extensive neuropathy bilaterally in both feet and legs  Neurological:     Mental Status: He is alert and oriented to person, place, and time.  Psychiatric:        Mood and Affect: Mood normal.                Lab Results  Component Value Date   WBC 7.8 11/09/2016   HGB 11.6 (L) 11/09/2016   HCT 36.4 (L) 11/09/2016   PLT 284 11/09/2016   GLUCOSE 139 (H) 11/09/2016   ALT 13 09/21/2016   AST 16 09/21/2016   NA 141 11/09/2016   K 5.0 11/09/2016   CL 106 11/09/2016   CREATININE 1.63 (H) 11/09/2016   BUN 42 (H) 11/09/2016   CO2 24 11/09/2016   HGBA1C 8.6 (H) 08/27/2016      Assessment & Plan:  Hyperlipidemia LDL goal <100 Assessment & Plan: \On ZETIA 10 mg daily and CRESTOR 40 MG DAILY - [facility]     Chronic kidney disease, stage 3b (HCC) Assessment & Plan: Secondary to chronic uncontrolled diabetes. Will continue to monitor He has blood work done through the [place]   Chronic obstructive pulmonary disease, unspecified COPD type (HCC) Assessment & Plan: Stable. Remote smoker. Not on any inhalers. Denies any chest pain or SOB  Uptodate with lung cancer screening    Uncontrolled type 2 diabetes mellitus with hyperglycemia (HCC) Assessment & Plan: DIABETES TYPE 2 UNCONTROLLED WITH COMPLICATIONS OF NEUROPATHY AND PERIPHERAL ULCERS. Chronic condition with poor glycemic control (HbA1c 9.8). Neuropathy in both feet extending to knees. Open sores on feet, no ulcers on left toe. Regular follow-up at [facility] hospital for diabetes management and foot care. - Continue insulin  therapy and ensure adherence to dosing schedule. - Monitor blood glucose levels regularly. - [facility] and foot care. - CONTINUE TO Wear diabetic shoes consistently to prevent injury. OFFERED A REFERRAL TO [facility]AT[facility] BUT HE DECLINED., [facility] 2 DAYS FROM NOW. HE WILL HAVE BLOOD WORK DONE WITH THEM   Chronic midline low back pain without sciatica Assessment & Plan: CHRONIC MIDLINE LOW BACK PAIN WITHOUT SCIATICA  Persistent low back pain managed with lidocaine  patches and Tylenol . Pain localized to lower back without radiation. Possible relation to lower body weakness and neuropathy. - Refer to physical therapy for fall prevention and strength training. - Encourage exercises to improve strength. - Continue current  pain management regimen with lidocaine  patches and Tylenol . - Encourage use of back brace and TENS unit as needed.  Orders: -     Ambulatory referral to Physical Therapy  Frequent falls Assessment & Plan: Two falls in the last thirty days, one at a car dealership and one at home. No head injuries. Falls may be related to lower body weakness and neuropathy. - Refer to physical therapy for fall prevention and strength training. - Encourage exercises to improve strength and balance. Walks with a walker already  Orders: -     Ambulatory referral to Physical Therapy  Essential (primary) hypertension Assessment & Plan: Elevated blood pressure today, possibly due to physical exertion. Infrequent home monitoring. - Recheck blood pressure during the visit. REPEAT BP IMPROVED.  - Advise daily home blood pressure monitoring, especially in the evening after resting. - Review blood pressure readings at the next visit.  - continue Amlodipine  10 mg daily, lasix  80 mg daily, Zestoretic  20/25 mg daily.      Assessment and Plan    Assessment and Plan           No orders of the defined types were placed in this encounter.   Orders Placed This Encounter  Procedures   Ambulatory referral  to Physical Therapy     Follow-up: No follow-ups on file.  An After Visit Summary was printed and given to the patient.  Elayjah Chaney, MD [facility] [phone]

## 2024-05-28 NOTE — Assessment & Plan Note (Signed)
 Stable. Remote smoker. Not on any inhalers. Denies any chest pain or SOB  Uptodate with lung cancer screening

## 2024-05-28 NOTE — Assessment & Plan Note (Signed)
 Secondary to chronic uncontrolled diabetes. Will continue to monitor He has blood work done through the [facility]

## 2024-05-28 NOTE — Patient Instructions (Signed)
  VISIT SUMMARY: Today, we discussed your diabetes management, recent falls, chronic low back pain, and high blood pressure. We reviewed your current treatments and made some adjustments to help manage your conditions better.  YOUR PLAN: TYPE 2 DIABETES MELLITUS WITH FOOT ULCER AND DIABETIC POLYNEUROPATHY: You have diabetes with poor blood sugar control and neuropathy in your feet. You also have open sores on your feet that need attention. -Continue taking your insulin  as prescribed and make sure you do not miss any doses. -Monitor your blood sugar levels regularly. -Attend your appointment at the [facility] hospital for blood work and foot care. -Wear your diabetic shoes all the time to prevent injuries.  RECURRENT FALLS: You have had two falls recently, which may be due to weakness and neuropathy in your lower body. -We will refer you to physical therapy to help prevent falls and improve your strength and balance. -Do exercises that improve your strength and balance.  CHRONIC LOW BACK PAIN: You have ongoing low back pain that you manage with lidocaine  patches and Tylenol . -We will refer you to physical therapy to help prevent falls and improve your strength. -Continue using lidocaine  patches and Tylenol  for pain management. -Use your back brace and TENS unit as needed.  HYPERTENSION: Your blood pressure was high today, and you do not check it often at home. -We will recheck your blood pressure during this visit. -Check your blood pressure at home every day, especially in the evening after you have rested. -Bring your blood pressure readings to your next visit so we can review them.                      Contains text generated by Abridge.                                 Contains text generated by Abridge.

## 2024-05-28 NOTE — Assessment & Plan Note (Signed)
 CHRONIC MIDLINE LOW BACK PAIN WITHOUT SCIATICA  Persistent low back pain managed with lidocaine  patches and Tylenol . Pain localized to lower back without radiation. Possible relation to lower body weakness and neuropathy. - Refer to physical therapy for fall prevention and strength training. - Encourage exercises to improve strength. - Continue current pain management regimen with lidocaine  patches and Tylenol . - Encourage use of back brace and TENS unit as needed.

## 2024-05-28 NOTE — Assessment & Plan Note (Signed)
\  On ZETIA 10 mg daily and CRESTOR 40 MG DAILY - Continue current management as per [facility] hospital recommendations

## 2024-06-12 ENCOUNTER — Telehealth: Payer: Self-pay

## 2024-06-12 NOTE — Telephone Encounter (Signed)
 Copied from CRM [id number]. Topic: Referral - Question >> Jun 12, 2024 10:45 AM Marc Vasquez wrote: Reason for CRM: [facility] is calling to inform the office that the patient doesn't want to schedule Physical therapy He declined  [facility] [phone] If the patient changes his mind they will need a new referral

## 2024-08-06 ENCOUNTER — Ambulatory Visit

## 2024-09-19 NOTE — Progress Notes (Signed)
 Marc Vasquez                                          MRN: [id number]   09/19/2024   The [facility] Quality Team Specialist reviewed this patient medical record for the purposes of chart review for care gap closure. The following were reviewed: chart review for care gap closure-kidney health evaluation for diabetes:eGFR  and uACR.    [facility]BCI Quality Team

## 2024-09-21 ENCOUNTER — Telehealth: Payer: Self-pay

## 2024-09-21 NOTE — Telephone Encounter (Signed)
 Arlean from [facility] called to verify that Dr.Sirivol is PCP of patient and would sign home health orders. Verbalized Provider is PCP of patient and orders can be sent to provider.

## 2024-09-24 ENCOUNTER — Telehealth: Payer: Self-pay

## 2024-09-24 NOTE — Telephone Encounter (Signed)
 I gave verbal approve to [facility]. He is taking magnesium and it is not in his medication list. It was given from the hospital. I called him to set up an appointment for hospital follow up however he refused because it is hard for him to come to the office for the pain and also he is not able to do video visit.   Dr Sherre discussed with Home Health company if the visit will be covered for insurance and they said that they accept Hospital visit as face to face with the patients.  They will encourage patient to set up an appointment in January. Provider will continue sign the order for Home Health.    Copied from CRM #[id number]. Topic: Clinical - Home Health Verbal Orders >> Sep 24, 2024  4:20 PM Shanda MATSU wrote: Caller/Agency: Isaiah w/[facility]  Callback Number: [phone] Service Requested: Skilled Nursing Frequency:  1 week 1 2 week 2 1 week 3 2 PRN Any new concerns about the patient? Yes, wound on left shin that wound care can be started on.

## 2024-09-24 NOTE — Telephone Encounter (Signed)
 Copied from CRM #[id number]. Topic: Clinical - Medication Question >> Sep 24, 2024  4:24 PM Shanda MATSU wrote: Reason for CRM: Caller called in wanting to know if patient needs to be taking med, magnesium 420mg , once a day or twice a day, I did adv caller that I do not show this med on the patient's active med list, caller stated patient provided her with the bottle when going over his meds.   Caller also wanted to know if med, DULoxetine  (CYMBALTA ) 30 MG capsule, is still an active med for the patient, I did adv caller that I show this med as an active med for the patient prescribed by Dr. Sirivol.  Caller is req a call back in regards to these meds.

## 2024-09-28 ENCOUNTER — Telehealth: Payer: Self-pay

## 2024-09-28 ENCOUNTER — Ambulatory Visit: Payer: Self-pay

## 2024-09-28 NOTE — Telephone Encounter (Signed)
 Glade [phone] Glade from [facility] --called to report high blood sugar --she states patient had was not complaining of any symptoms  She states that she had wound care order for honey and she wanted to get this changed to xeroform and a foam adhesive dressing due to a shortage of honey. ---she went ahead and treated the wound with the xeroform and foam adhesive dressing  Patient also wants a call back to get an appointment set up for a hospital follow-up from going to [facility] ER last week  FYI Only or Action Required?: Action required by provider: request for appointment and update on patient condition.  Patient was last seen in primary care on 05/28/2024 by Sirivol, Mamatha, MD.  Called Nurse Triage reporting Hyperglycemia.  Symptoms began patient states this is an ongoing thing and his blood sugars fluctuate a lot. He states that he has had the cold like symptoms since being at the [facility] ER a week ago.  Interventions attempted: Rest, hydration, or home remedies.  Symptoms are: gradually worsening.  Triage Disposition: Call PCP Now  Patient/caregiver understands and will follow disposition?: Yes               Copied from CRM [id number]. Topic: Clinical - Red Word Triage >> Sep 28, 2024  1:58 PM Joesph NOVAK wrote: Red Word that prompted transfer to Nurse Triage: hh nurse reporting patients blood sugar, Blood sugar 300 per dexcom, has not taken his insulin  yet today because he did not want to.  HH nurse [phone] Reason for Disposition  [1] Blood glucose > 300 mg/dL (83.2 mmol/L) AND [7] two or more times in a row  Answer Assessment - Initial Assessment Questions Glade [phone] Glade from [facility] ---whenever blood sugar is above 250 they have to report to PCP ---patient's blood sugar is 300 and patient states (read HIGH and it looked like the line was around 300) ---patient denied any symptoms 268 was the seven day average  per Stacy 328 was the 3 days average per Glade She states she did provide education about his blood sugar prior to leaving  She states that she had wound care order for honey and she wanted to get this changed to xeroform and a foam adhesive dressing due to a shortage of honey. ---she went ahead and treated the wound with the xeroform and foam adhesive dressing  This RN called the patient to check on him Patient did not take his medication today but he took it yesterday Patient states he has a bad cold and hasn't felt like doing anything today Patient states he just has a head cold and feels really bad, congested. Patient denies chest pain, difficulty breathing, vomiting.  Patient takes Insulin : 70 units in the morning 40 at lunch 50 at night  He states he will take his next dose Patient's wife is home with him  Patient states he went to [facility] due to his legs giving out on him--he states last Wednesday.  He states he fell three times and an ambulance took him to [place]. Patient states that he did not get a cat scan done that was ordered at the hospital due to being claustrophobic---it was supposed to be on his back Patient states that his blood sugar fluctuates a lot and denies any green or yellow mucous/phlegm. He is advised that if he has been experiencing the congestion for a week and his blood sugar levels have been elevated like they are it is advised  to be seen today at Urgent Care and if anything worsens the Emergency Room. He states he has no interest in going to Urgent Care for assessment at this time. He is advised that if anything changes to call us  back and if anything worsens to go to the Emergency Room. Patient verbalized understanding and states he will see his PCP as soon as he can be scheduled to follow up from the recent ER visit. He would like a call back from the office to get this scheduled.  Protocols used: Diabetes - High Blood Sugar-A-AH

## 2024-09-28 NOTE — Telephone Encounter (Signed)
 Copied from CRM #[id number]. Topic: Clinical - Home Health Verbal Orders >> Sep 28, 2024 12:14 PM Mia F wrote: Caller/Agency: Selinda with [facility] Callback Number: [phone] Service Requested: Physical Therapy Frequency: 2 times a week for 7 week and one time a week for one week.  Any new concerns about the patient? No

## 2024-09-28 NOTE — Telephone Encounter (Signed)
 Called and left detailed message informing Selinda with [facility] health that it is okay to re-new orders for PT visits and to fax over orders to be signed.

## 2024-10-03 NOTE — Telephone Encounter (Signed)
 Called patient and spoke with him and he stated that his sugars have been running in the high 300's and the patient stated he has had a cold for the past couple of days and he is not on any steroids or has not been in a very long time. He also mentioned that he is still coming to his appointment as well that is scheduled for next week.

## 2024-10-05 NOTE — Telephone Encounter (Signed)
 I spoke with Marc Vasquez, the home health care nurse, who was with him and the [place] just started him on Mounjaro 3 days ago. His sugars have been  56 - 370 over this past week. No changes at this time. Dr. Sherre

## 2024-10-11 ENCOUNTER — Ambulatory Visit (INDEPENDENT_AMBULATORY_CARE_PROVIDER_SITE_OTHER)

## 2024-10-11 VITALS — BP 116/78 | HR 60 | Temp 98.6°F | Ht 72.0 in | Wt 285.0 lb

## 2024-10-11 DIAGNOSIS — M47817 Spondylosis without myelopathy or radiculopathy, lumbosacral region: Secondary | ICD-10-CM | POA: Diagnosis not present

## 2024-10-11 DIAGNOSIS — N1832 Chronic kidney disease, stage 3b: Secondary | ICD-10-CM

## 2024-10-11 DIAGNOSIS — E1122 Type 2 diabetes mellitus with diabetic chronic kidney disease: Secondary | ICD-10-CM

## 2024-10-11 DIAGNOSIS — N1831 Chronic kidney disease, stage 3a: Secondary | ICD-10-CM

## 2024-10-11 DIAGNOSIS — Z794 Long term (current) use of insulin: Secondary | ICD-10-CM | POA: Diagnosis not present

## 2024-10-11 DIAGNOSIS — E1165 Type 2 diabetes mellitus with hyperglycemia: Secondary | ICD-10-CM

## 2024-10-11 DIAGNOSIS — I447 Left bundle-branch block, unspecified: Secondary | ICD-10-CM | POA: Insufficient documentation

## 2024-10-11 DIAGNOSIS — T148XXA Other injury of unspecified body region, initial encounter: Secondary | ICD-10-CM | POA: Insufficient documentation

## 2024-10-11 DIAGNOSIS — J449 Chronic obstructive pulmonary disease, unspecified: Secondary | ICD-10-CM

## 2024-10-11 DIAGNOSIS — E118 Type 2 diabetes mellitus with unspecified complications: Secondary | ICD-10-CM | POA: Insufficient documentation

## 2024-10-11 DIAGNOSIS — Z23 Encounter for immunization: Secondary | ICD-10-CM | POA: Diagnosis not present

## 2024-10-11 NOTE — Assessment & Plan Note (Addendum)
 Plan as below.

## 2024-10-11 NOTE — Progress Notes (Signed)
 "  Subjective:  Patient ID: Marc Vasquez, male    DOB: Feb 28, 1946  Age: 79 y.o. MRN: [id number]  Chief Complaint  Patient presents with   Hospitalization Follow-up    HPI: Discussed the use of AI scribe software for clinical note transcription with the patient, who gave verbal consent to proceed.  History of Present Illness   Marc Vasquez is a 79 year old male with diabetes who presents for follow-up after a fall. He is accompanied by his wife.   Recent fall and functional status - Experienced a fall on December 17th, resulting in overnight hospitalization - Currently receives home therapy and physical therapy twice weekly - Demonstrates significant improvement in mobility and strength, now able to shower and dress independently - First outing since the fall, able to navigate steps with some difficulty - Performs home exercises to increase activity level  Lumbar spine fractures and mobility - History of lumbar spine fractures with old compressed discs - Performs regular exercises with caregiver to maintain mobility  Glycemic control and diabetes management - Diabetes managed with insulin  and Mounjaro, recently added to regimen - Blood glucose levels fluctuate from mid-40s to 400 mg/dL - Experiences hypoglycemic episodes, particularly postprandially, managed by adjusting insulin  doses - Current insulin  regimen: 50 units in the morning, 20 units at lunch, 45 units in the evening, with recent reductions in lunch and evening doses due to hypoglycemia - Last hemoglobin A1c was 9.8 - Weight loss of 18 pounds since August 25th - [facility]  Peripheral edema and foot care - Experiences swelling in feet - Prefers socks with rubber grips for safety - Upcoming podiatry appointment for evaluation of toe issues, possibly related to shoe fit and swelling  Antihypertensive and cardiometabolic medication use - Takes amlodipine  10 mg daily,  lisinopril -hydrochlorothiazide  20/25 mg, Lasix  as needed, Crestor 40 mg, and Jardiance 25 mg           10/11/2024    2:18 PM 05/28/2024    9:10 AM 02/22/2024    1:28 PM 11/28/2023    8:23 AM 09/22/2023   11:21 AM  Depression screen PHQ 2/9  Decreased Interest 0 0 0 0 2  Down, Depressed, Hopeless 0 1 0 0 1  PHQ - 2 Score 0 1 0 0 3  Altered sleeping 0 0   2  Tired, decreased energy 0    2  Change in appetite 0 2   2  Feeling bad or failure about yourself  0 0   1  Trouble concentrating 0 0   1  Moving slowly or fidgety/restless 0 0   2  Suicidal thoughts 0 0   0  PHQ-9 Score 0 3    13   Difficult doing work/chores Not difficult at all Not difficult at all   Extremely dIfficult     Data saved with a previous flowsheet row definition        10/11/2024    2:18 PM  Fall Risk   Falls in the past year? 1  Number falls in past yr: 1  Injury with Fall? 1  Risk for fall due to : History of fall(s)  Follow up Falls evaluation completed    Patient Care Team: Vikkie Goeden, MD as PCP - General (Family Medicine) Larnell Purchase, MD as Referring Physician (Orthopedic Surgery)   Review of Systems  Constitutional:  Negative for chills, fatigue and fever.  HENT:  Negative for congestion, ear pain, sinus  pressure and sore throat.   Respiratory:  Negative for cough and shortness of breath.   Cardiovascular:  Negative for chest pain.  Gastrointestinal:  Negative for abdominal pain, constipation, diarrhea, nausea and vomiting.  Genitourinary:  Negative for dysuria and frequency.  Musculoskeletal:  Positive for gait problem. Negative for arthralgias, back pain and myalgias.  Skin:  Positive for color change.  Neurological:  Negative for dizziness and headaches.  Psychiatric/Behavioral:  Negative for dysphoric mood. The patient is not nervous/anxious.     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 (HCC)    Former smoker quit on 12/15/15   Generalized OA    GERD (gastroesophageal reflux disease)    History of left knee replacement    History of stroke    Hypercholesteremia    Infection of prosthetic left knee joint    Left hip pain    Lumbago    OSA (obstructive sleep apnea)    Pneumonia    Poorly controlled diabetes mellitus (HCC)    Scarlet fever    Past Surgical History:  Procedure Laterality Date   EYE SURGERY     broken blood vessels, catarcts bil   JOINT REPLACEMENT     left foot surgery     stepped on thorn as child and got infected   TEE WITHOUT CARDIOVERSION N/A 05/31/2016   Procedure: TRANSESOPHAGEAL ECHOCARDIOGRAM (TEE);  Surgeon: Oneil JAYSON Parchment, MD;  Location: [facility];  Service: Cardiovascular;  Laterality: N/A;   TOE AMPUTATION     partial toe amputation   TOTAL KNEE REVISION Left 05/28/2016   Procedure: REMOVAL OF ALL COMPONENTS OF LEFT TOTAL KNEE AND PLACEMENT OF ANTIBIOTIC SPACER;  Surgeon: Lonni CINDERELLA Poli, MD;  Location: [facility] OR;  Service: Orthopedics;  Laterality: Left;   TOTAL KNEE REVISION Left 08/27/2016   Procedure: LEFT TOTAL KNEE REVISION WITH REMOVAL OF ANTIBIOTIC SPACER;  Surgeon: Lonni CINDERELLA Poli, MD;  Location: [facility];  Service: Orthopedics;  Laterality: Left;    Family History  Problem Relation Age of Onset   Diabetes Sister    Diabetes Brother    Heart Problems Son    Heart disease Other    Diabetes Other    Social History   Socioeconomic History   Marital status: Married    Spouse name: Not on file   Number of children: Not on file   Years of education: Not on file   Highest education level: Not on file  Occupational History   Occupation: retired visual merchandiser   Occupation: former veteran  Tobacco Use   Smoking status: Former    Current packs/day: 0.00    Average packs/day: 1 pack/day for 15.0 years (15.0 ttl pk-yrs)    Types:  Cigarettes    Start date: 12/14/2000    Quit date: 12/15/2015    Years since quitting: 8.8   Smokeless tobacco: Never  Substance and Sexual Activity   Alcohol use: Yes    Alcohol/week: 1.0 standard drink of alcohol    Types: 1 Standard drinks or equivalent per week    Comment: rarely   Drug use: No   Sexual activity: Yes    Partners: Female    Comment: married , lives with his wife  Other Topics Concern   Not on file  Social History Narrative   Not on file  Social Drivers of Health   Tobacco Use: Medium Risk (10/11/2024)   Patient History    Smoking Tobacco Use: Former    Smokeless Tobacco Use: Never    Passive Exposure: Not on file  Financial Resource Strain: Low Risk (02/22/2024)   Overall Financial Resource Strain (CARDIA)    Difficulty of Paying Living Expenses: Not hard at all  Food Insecurity: No Food Insecurity (02/22/2024)   Hunger Vital Sign    Worried About Running Out of Food in the Last Year: Never true    Ran Out of Food in the Last Year: Never true  Transportation Needs: No Transportation Needs (02/22/2024)   PRAPARE - Administrator, Civil Service (Medical): No    Lack of Transportation (Non-Medical): No  Physical Activity: Inactive (02/22/2024)   Exercise Vital Sign    Days of Exercise per Week: 0 days    Minutes of Exercise per Session: 0 min  Stress: No Stress Concern Present (02/22/2024)   Marc Vasquez    Feeling of Stress : Only a little  Social Connections: Socially Integrated (02/22/2024)   Social Connection and Isolation Panel    Frequency of Communication with Friends and Family: More than three times a week    Frequency of Social Gatherings with Friends and Family: More than three times a week    Attends Religious Services: More than 4 times per year    Active Member of Clubs or Organizations: Yes    Attends Banker Meetings: Never    Marital Status: Married   Depression (PHQ2-9): Low Risk (10/11/2024)   Depression (PHQ2-9)    PHQ-2 Score: 0  Alcohol Screen: Low Risk (02/22/2024)   Alcohol Screen    Last Alcohol Screening Score (AUDIT): 0  Housing: Unknown (02/22/2024)   Housing Stability Vital Sign    Unable to Pay for Housing in the Last Year: No    Number of Times Moved in the Last Year: Not on file    Homeless in the Last Year: No  Utilities: Not At Risk (02/22/2024)   AHC Utilities    Threatened with loss of utilities: No  Health Literacy: Adequate Health Literacy (02/22/2024)   B1300 Health Literacy    Frequency of need for help with medical instructions: Rarely    Objective:  BP 116/78   Pulse 60   Temp 98.6 F (37 C)   Ht 6' (1.829 m)   Wt 285 lb (129.3 kg)   SpO2 96%   BMI 38.65 kg/m      10/11/2024    2:13 PM 05/28/2024    9:49 AM 05/28/2024    9:03 AM  BP/Weight  Systolic BP 116 132 144  Diastolic BP 78 88 80  Wt. (Lbs) 285  303  BMI 38.65 kg/m2  41.09 kg/m2    Physical Exam Vitals and nursing note reviewed.  Constitutional:      Appearance: He is obese.  HENT:     Head: Normocephalic and atraumatic.     Nose: Nose normal.  Cardiovascular:     Rate and Rhythm: Normal rate and regular rhythm.  Pulmonary:     Effort: Pulmonary effort is normal.     Breath sounds: Normal breath sounds.  Musculoskeletal:        General: Swelling (trace to 1+ pedal edema bilaterally) present.  Skin:    Comments: Chronic venous stasis changes noted in both legs Neuropathy noted in both legs  Neurological:  Mental Status: He is alert and oriented to person, place, and time.         Lab Results  Component Value Date   WBC 7.8 11/09/2016   HGB 11.6 (L) 11/09/2016   HCT 36.4 (L) 11/09/2016   PLT 284 11/09/2016   GLUCOSE 139 (H) 11/09/2016   ALT 13 09/21/2016   AST 16 09/21/2016   NA 141 11/09/2016   K 5.0 11/09/2016   CL 106 11/09/2016   CREATININE 1.63 (H) 11/09/2016   BUN 42 (H) 11/09/2016   CO2 24 11/09/2016    HGBA1C 8.6 (H) 08/27/2016    Results for orders placed or performed in visit on 02/08/17  C-reactive protein   Collection Time: 02/08/17 10:33 AM  Result Value Ref Range   CRP 5.4 <8.0 mg/L  Sedimentation rate   Collection Time: 02/08/17 10:33 AM  Result Value Ref Range   Sed Rate 36 (H) 0 - 20 mm/hr  .  Assessment & Plan:   Assessment & Plan Uncontrolled type 2 diabetes mellitus with hyperglycemia (HCC) Plan as below    Chronic obstructive pulmonary disease, unspecified COPD type (HCC)     Type 2 diabetes mellitus with stage 3a chronic kidney disease, with long-term current use of insulin  (HCC) Type 2 diabetes mellitus UNCONTROLLED  with stage 3b chronic kidney disease with current long term use of insulin . Blood sugar levels fluctuate between 40s and 400s. POINT OF CARE A1c is 9.8, slightly improved from previous 9.0. Currently on insulin  regimen with adjustments due to hypoglycemia. Mounjaro recently started, expected to aid in blood sugar control and weight loss. Creatinine is 1.5 with a filtration rate of 47. - Continue insulin  regimen with adjustments as needed for hypoglycemia. - Continue Mounjaro for blood sugar control and weight loss. - Follow up with endocrinology at [facility] hospital.  Peripheral neuropathy of feet Peripheral neuropathy with no sensation in feet. Recent issues with toe rubbing and possible fluid retention. Compression stockings not currently used. - Use compression stockings as recommended. CONTINUE CYMBALTA  30 MG DAILY, LIDODERM  PATCHES - Follow up with podiatrist for foot care and shoe fitting.    Chronic kidney disease, stage 3b (HCC) STABLE. Latest blood work at [facility] showed stage 3 CKD, secondary to diabetes.  Will continue to monitor. Has blood work at the [facility] hospital normally.      General health maintenance Discussion of flu vaccination. Recent weight loss of 18 pounds since August 25th. - Administered flu shot today. -  Encouraged healthy eating and regular physical activity.       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 Likely contributing to mobility issues. - Continue physical therapy twice a week. - Encouraged regular exercise and mobility.  Fall with functional decline Recent fall resulting in hospital admission. Currently undergoing home therapy with significant improvement in strength and mobility. Able to perform daily activities with assistance. - Continue home therapy with physical therapy twice a week. - Encouraged regular exercise and mobility.      Body mass index is 38.65 kg/m.   No orders of the defined types were placed in this encounter.   No orders of the defined types were placed in this encounter.      Follow-up: No follow-ups on file.  An After Visit Summary was printed and given to the patient.  Marc Vasquez  Bj Morlock, MD [facility] [phone]     [1]  Current Outpatient Medications on File Prior to Visit  Medication Sig Dispense Refill   acetaminophen  (TYLENOL ) 325 MG tablet Take 650 mg by mouth every 6 (six) hours as needed (for breakthrough pain).     amLODipine  (NORVASC ) 10 MG tablet Take 10 mg by mouth daily.     ARTIFICIAL TEAR OP Apply 1 drop to eye daily as needed (dry eyes).     Cholecalciferol  (VITAMIN D3) 1000 units CAPS Take 3,000 Units by mouth daily.     DULoxetine  (CYMBALTA ) 30 MG capsule Take 1 capsule (30 mg total) by mouth daily. 90 capsule 2   empagliflozin (JARDIANCE) 25 MG TABS tablet Take 25 mg by mouth daily.     ezetimibe (ZETIA) 10 MG tablet Take 5 mg by mouth daily.     famotidine (PEPCID) 20 MG tablet Take 1 tablet by mouth daily.     furosemide  (LASIX ) 80 MG tablet Take 80 mg by mouth daily. May take an  additional 80mg s if swelling persists     Insulin  Regular Human (HUMULIN R  U-500 KWIKPEN [place]) Inject 90 Units into the skin daily. 90 U at breakfast 40 U at lunch 60 U at night     lidocaine  (LIDODERM ) 5 % Place 2 patches onto the skin daily. Remove & Discard patch within 12 hours or as directed by MD     lisinopril -hydrochlorothiazide  (ZESTORETIC ) 20-25 MG tablet Take 1 tablet by mouth daily.     Misc Natural Products (PROSTATE PO) Take 2 tablets by mouth daily.     rosuvastatin (CRESTOR) 40 MG tablet Take 40 mg by mouth at bedtime.     tamsulosin  (FLOMAX ) 0.4 MG CAPS capsule Take 0.8 mg by mouth at bedtime.      tirzepatide (MOUNJARO) 2.5 MG/0.5ML Pen Inject 2.5 mg into the skin once a week.     No current facility-administered medications on file prior to visit.   "

## 2024-10-11 NOTE — Assessment & Plan Note (Signed)
 History of lumbar vertebral compression fractures Old lumbar vertebral compression fractures with no new fractures on recent imaging. Likely contributing to mobility issues. - Continue physical therapy twice a week. - Encouraged regular exercise and mobility.  Fall with functional decline Recent fall resulting in hospital admission. Currently undergoing home therapy with significant improvement in strength and mobility. Able to perform daily activities with assistance. - Continue home therapy with physical therapy twice a week. - Encouraged regular exercise and mobility.

## 2024-10-11 NOTE — Assessment & Plan Note (Addendum)
 Marc Vasquez

## 2024-10-11 NOTE — Patient Instructions (Signed)
" °  VISIT SUMMARY: You had a follow-up visit today to discuss your recent fall, diabetes management, and other health concerns. You have shown significant improvement in mobility and strength since your fall, and your diabetes management plan was reviewed and adjusted.  YOUR PLAN: TYPE 2 DIABETES MELLITUS WITH STAGE 3B CHRONIC KIDNEY DISEASE: Your blood sugar levels have been fluctuating, and your recent A1c is 9.8. You are currently on an insulin  regimen and have recently started Mounjaro to help control your blood sugar and aid in weight loss. -Continue your current insulin  regimen and make adjustments as needed to manage hypoglycemia. -Continue taking Mounjaro for blood sugar control and weight loss. -Follow up with your endocrinologist at the [facility] hospital.  HISTORY OF LUMBAR VERTEBRAL COMPRESSION FRACTURES: You have old lumbar vertebral compression fractures that are likely contributing to your mobility issues. -Continue physical therapy twice a week. -Keep up with regular exercise and mobility activities.  FALL WITH FUNCTIONAL DECLINE: You recently experienced a fall that resulted in a hospital stay. You are currently undergoing home therapy and have shown significant improvement in strength and mobility. -Continue home therapy with physical therapy twice a week. -Keep up with regular exercise and mobility activities.  PERIPHERAL NEUROPATHY OF FEET: You have peripheral neuropathy with no sensation in your feet and recent issues with toe rubbing and possible fluid retention. -Use compression stockings as recommended. -Follow up with your podiatrist for foot care and shoe fitting.  GENERAL HEALTH MAINTENANCE: We discussed your recent weight loss and flu vaccination. -You received your flu shot today. -Continue with healthy eating and regular physical activity.                      Contains text generated by Abridge.                                  Contains text generated by Abridge.   "

## 2024-10-11 NOTE — Assessment & Plan Note (Addendum)
 STABLE. Latest blood work at [facility] showed stage 3 CKD, secondary to diabetes.  Will continue to monitor. Has blood work at the [facility] hospital normally.      General health maintenance Discussion of flu vaccination. Recent weight loss of 18 pounds since August 25th. - Administered flu shot today. - Encouraged healthy eating and regular physical activity.

## 2024-10-19 ENCOUNTER — Telehealth: Payer: Self-pay

## 2024-10-19 NOTE — Telephone Encounter (Signed)
 [facility] Plan of Care

## 2024-10-22 ENCOUNTER — Ambulatory Visit: Payer: Self-pay

## 2024-10-22 NOTE — Telephone Encounter (Signed)
 FYI Only or Action Required?: Action required by provider: clinical question for provider and update on patient condition.  Patient was last seen in primary care on 10/11/2024 by Sirivol, Mamatha, MD.  Called Nurse Triage reporting Fall.  Symptoms began several days ago.  Interventions attempted: OTC medications: lidocaine  patches; tylenol .  Symptoms are: gradually improving.  Triage Disposition: Home Care  Patient/caregiver understands and will follow disposition?: Yes    Copied from CRM [id number]. Topic: Clinical - Medical Advice >> Oct 22, 2024  3:14 PM Ber[place] MATSU wrote: Reason for CRM:  Home Health called in to advise that Patient fell on 10/19/24. He did not feel the need to go to ER . Has bruise on left knee and has not been seen or called in to report fall.    Reason for Disposition  [1] Recent fall AND [2] no injury  Answer Assessment - Initial Assessment Questions Contacted pt to f/u on symptoms and events from fall after reported by [facility]. Pt states he fell last Friday, 10/19/24, around 9pm when he was using the restroom before bed. Pt states he was using pull bar to pull himself back up after urinating and his legs went out from under him causing him to hit his back, L knee and shoulder; denies hitting head, LOC or dizziness. Pt states he was able to get himself up with aid from a chair and his wife. Pt is not on blood thinners. Pt is managing pain with lidocaine  patches to lower back and tylenol  PRN. Reassured him to contact clinic if pain worsening or any swelling at the knee. He voiced appreciation.     1. MECHANISM: How did the fall happen?     Pt fell when using support bar in bathroom to help him get up from toilet   2. DOMESTIC VIOLENCE AND ELDER ABUSE SCREENING: Did you fall because someone pushed you or tried to hurt you? If Yes, ask: Are you safe now?     No   3. ONSET: When did the fall happen? (e.g., minutes, hours, or days ago)     Friday around 9pm,  10/19/24  4. LOCATION: What part of the body hit the ground? (e.g., back, buttocks, head, hips, knees, hands, head, stomach)     Back/hip, hands, L knee  5. INJURY: Did you hurt (injure) yourself when you fell? If Yes, ask: What did you injure? Tell me more about this? (e.g., body area; type of injury; pain severity)     Pt states he has a bruise to his L knee from trying to catch himself and lower back/shoulder pain.   6. PAIN: Is there any pain? If Yes, ask: How bad is the pain? (e.g., Scale 0-10; or none, mild,      Yes; mild to moderate   7. SIZE: For cuts, bruises, or swelling, ask: How large is it? (e.g., inches or centimeters)      Bruise to L knee   9. OTHER SYMPTOMS: Do you have any other symptoms? (e.g., dizziness, fever, weakness; new-onset or worsening).      None   10. CAUSE: What do you think caused the fall (or falling)? (e.g., dizzy spell, tripped)       I went to stand up and it was like my legs weren't there. Denies dizziness and was about to scoot to side of bed where his wife helped him get into bed by pt pulling up on a chair and side of bed.  Protocols used: Falls and [facility]

## 2024-10-26 DIAGNOSIS — S22030D Wedge compression fracture of third thoracic vertebra, subsequent encounter for fracture with routine healing: Secondary | ICD-10-CM | POA: Diagnosis not present

## 2024-10-26 DIAGNOSIS — M47816 Spondylosis without myelopathy or radiculopathy, lumbar region: Secondary | ICD-10-CM | POA: Diagnosis not present

## 2024-10-26 DIAGNOSIS — E1122 Type 2 diabetes mellitus with diabetic chronic kidney disease: Secondary | ICD-10-CM | POA: Diagnosis not present

## 2024-10-26 DIAGNOSIS — S22020D Wedge compression fracture of second thoracic vertebra, subsequent encounter for fracture with routine healing: Secondary | ICD-10-CM | POA: Diagnosis not present

## 2024-10-26 DIAGNOSIS — Z9181 History of falling: Secondary | ICD-10-CM | POA: Diagnosis not present

## 2024-10-26 DIAGNOSIS — J449 Chronic obstructive pulmonary disease, unspecified: Secondary | ICD-10-CM | POA: Diagnosis not present

## 2024-10-26 DIAGNOSIS — M4316 Spondylolisthesis, lumbar region: Secondary | ICD-10-CM | POA: Diagnosis not present

## 2024-10-26 DIAGNOSIS — I130 Hypertensive heart and chronic kidney disease with heart failure and stage 1 through stage 4 chronic kidney disease, or unspecified chronic kidney disease: Secondary | ICD-10-CM | POA: Diagnosis not present

## 2024-10-26 DIAGNOSIS — N1832 Chronic kidney disease, stage 3b: Secondary | ICD-10-CM | POA: Diagnosis not present

## 2024-10-26 DIAGNOSIS — S81812D Laceration without foreign body, left lower leg, subsequent encounter: Secondary | ICD-10-CM | POA: Diagnosis not present

## 2024-10-26 DIAGNOSIS — E669 Obesity, unspecified: Secondary | ICD-10-CM | POA: Diagnosis not present

## 2024-10-26 DIAGNOSIS — F32A Depression, unspecified: Secondary | ICD-10-CM | POA: Diagnosis not present

## 2024-11-01 ENCOUNTER — Telehealth: Payer: Self-pay

## 2024-11-01 NOTE — Telephone Encounter (Signed)
 [facility] Supplemental Orders from 10/30/24 to 10/30/24

## 2025-02-22 ENCOUNTER — Ambulatory Visit

## 2025-02-28 ENCOUNTER — Ambulatory Visit
